# Patient Record
Sex: Male | Born: 1970 | Race: White | Hispanic: No | Marital: Married | Smoking: Former smoker
Health system: Southern US, Community
[De-identification: ages and names within clinical notes are randomized; demographics above are authoritative.]

## PROBLEM LIST (undated history)

## (undated) DIAGNOSIS — G4730 Sleep apnea, unspecified: Secondary | ICD-10-CM

## (undated) DIAGNOSIS — C801 Malignant (primary) neoplasm, unspecified: Secondary | ICD-10-CM

## (undated) DIAGNOSIS — K08409 Partial loss of teeth, unspecified cause, unspecified class: Secondary | ICD-10-CM

## (undated) DIAGNOSIS — Z9889 Other specified postprocedural states: Secondary | ICD-10-CM

## (undated) DIAGNOSIS — F329 Major depressive disorder, single episode, unspecified: Secondary | ICD-10-CM

## (undated) DIAGNOSIS — I10 Essential (primary) hypertension: Secondary | ICD-10-CM

## (undated) DIAGNOSIS — J449 Chronic obstructive pulmonary disease, unspecified: Secondary | ICD-10-CM

## (undated) DIAGNOSIS — F32A Depression, unspecified: Secondary | ICD-10-CM

## (undated) DIAGNOSIS — G629 Polyneuropathy, unspecified: Secondary | ICD-10-CM

## (undated) DIAGNOSIS — D4702 Systemic mastocytosis: Secondary | ICD-10-CM

## (undated) DIAGNOSIS — E119 Type 2 diabetes mellitus without complications: Secondary | ICD-10-CM

## (undated) DIAGNOSIS — Z9689 Presence of other specified functional implants: Secondary | ICD-10-CM

## (undated) DIAGNOSIS — K219 Gastro-esophageal reflux disease without esophagitis: Secondary | ICD-10-CM

## (undated) HISTORY — DX: Chronic obstructive pulmonary disease, unspecified: J44.9

## (undated) HISTORY — DX: Sleep apnea, unspecified: G47.30

## (undated) HISTORY — DX: Polyneuropathy, unspecified: G62.9

## (undated) HISTORY — DX: Depression, unspecified: F32.A

## (undated) HISTORY — DX: Major depressive disorder, single episode, unspecified: F32.9

---

## 2011-10-06 HISTORY — PX: OTHER SURGICAL HISTORY: SHX169

## 2011-11-03 HISTORY — PX: TYMPANOSTOMY TUBE PLACEMENT: SHX32

## 2011-12-15 HISTORY — PX: SEPTOPLASTY: SUR1290

## 2012-11-22 DIAGNOSIS — M8580 Other specified disorders of bone density and structure, unspecified site: Secondary | ICD-10-CM | POA: Insufficient documentation

## 2013-07-25 HISTORY — PX: X-STOP IMPLANTATION: SHX2677

## 2014-08-06 HISTORY — PX: OTHER SURGICAL HISTORY: SHX169

## 2014-11-05 HISTORY — PX: OTHER SURGICAL HISTORY: SHX169

## 2016-08-22 DIAGNOSIS — D4702 Systemic mastocytosis: Secondary | ICD-10-CM | POA: Insufficient documentation

## 2016-08-22 NOTE — Progress Notes (Signed)
[facility] day:  08/23/2016  Chief Complaint: Dwayne Richard is a 45 y.o. 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 Kary Kos for assessment and management.  HPI: The patient states that he was diagnosed with indolent systemic mastocytosis in approximately 2006. At that time he was living in [place]. He was seen by dermatology for urticaria pigmentosa.   Bone marrow biopsy on 07/09/2009 at [facility] in [place], [place] demonstrated normal cellularity. There were small poorly defined areas of apparent spindle cells which did not stained positive for mast cells. CK mutation was negative. There were rare atypical cells expressing CD-117 and CD-25. The cells represented approximately 0.1% of the leukocytes.   Bone marrow biopsy on 10/10/2013 performed at [facility] in [place] revealed evidence of persistent systemic mastocytosis (5-10% of cellularity).  There was no increase in blast or overt dyspoiesis.  There was moderate reticulin fibrosis. Storage iron was present. There were no ring sideroblasts.  Flow cytometry revealed no phenotypic evidence of lymphoma or increased blasts.  Cytogenetics were normal (45, XY). There was no evidence of KIT (D816V) point mutation by PCR.   He was started on several medications to control his chronic symptoms.  Tryptase was 38.3 (< 11.5) on 09/07/2013.   He moved to [place] 2014. He was seen in the [facility] by Dr. Dyann Ruddle on 09/11/2015.  At that time he noted episodes of pruritus with associated urticaria. Trigger for these episodes included sunlight or warm temperatures. He also noted shortness of breath with significant skin flares of pruritus and urticaria.  On those occasions, he used epinephrine autoinjector. On average, he uses epinephrine about 2-3 times a year. He actively avoid situations which provoke flares.  He has a chronic  gastrointestinal symptoms which include nausea and diarrhea. He has been using Gastrocrom good results.  He has osteopenia on bone density study in 2014. He is on calcium and vitamin D.  He has not had any fractures.  He is on a number antihistamines, proton pump inhibitors and Gastrocrom. He has been treated with Gleevec. He has been on [place] for about 3 years. He has not noted any significant improvement with Gleevec. He was on a trial of interferon in early 2016 for 3 months.  Interferon was discontinued in 07/2015.  He received as much as 7 million notes 3 times weekly.  Interferon did not improve his symptoms.  He experienced uncontrolled diabetes and elevated liver function tests with interferon.  The patient was seen by Dr. Melissa Montane at [facility] in 2016 for consideration of bone marrow transplant. Transplant was not recommended.  He was seen Yetta Flock on 09/11/2015 at the [facility]. At that time he did not definitely definitively meet the criteria for systemic mastocytosis. He met 2 minor criteria an equivocal finding the third minor criteria.  Symptoms were consistent with a mast cell disorder.  Additional testing was requested.  He moved to [facility].  CBC on 07/29/2016 revealed a hematocrit of 38.2, hemoglobin 11.8, MCV 97.7,  platelets 231,000, and WBC 8900 with a normal differential.  CMP revealed a creatinine of 0.8, AST 40, ALT 64, alkaline phosphatase 68, and bilirubin 0.4.  Tryptase on 07/29/2016 was 32.3 (2.2-13.2).  Symptomatically, he is at his baseline.  Energy level is fair.   Past Medical History:  Diagnosis Date  . COPD (chronic obstructive pulmonary disease) ([place])   . Depression   . Neuropathy (  [place])   . Sleep apnea     Past Surgical History:  Procedure Laterality Date  . cataract surgery  08/06/2014  . cataract surgery  11/05/2014  . SEPTOPLASTY  12/15/2011  . Spinal implants  10/06/2011  . TYMPANOSTOMY TUBE PLACEMENT Right 11/03/2011  . X-STOP  IMPLANTATION Right 07/25/2013    No family history on file.  Social History:  reports that he quit smoking about 23 months ago. He does not have any smokeless tobacco history on file. His alcohol and drug histories are not on file.  He and his wife moved from [place].  The patient is alone today.  Allergies: Not on File  Current Medications: Current Outpatient Prescriptions  Medication Sig Dispense Refill  . Lancets 28G MISC Use 1 each 3 (three) times daily. Use as instructed.    . traMADol (ULTRAM) 50 MG tablet Take by mouth.    Marland Kitchen albuterol (PROVENTIL HFA;VENTOLIN HFA) 108 (90 Base) MCG/ACT inhaler Inhale into the lungs.    Marland Kitchen aspirin EC 81 MG tablet Take by mouth.    Marland Kitchen atorvastatin (LIPITOR) 20 MG tablet Take by mouth.    . Calcium Citrate 200 MG TABS Take by mouth.    . Cetirizine HCl 10 MG CAPS Take by mouth.    . cimetidine (TAGAMET) 400 MG tablet Take by mouth.    . cromolyn (GASTROCROM) 100 MG/5ML solution Take by mouth.    . diphenhydrAMINE (BENADRYL) 50 MG capsule Take by mouth.    . EPINEPHrine 0.3 mg/0.3 mL IJ SOAJ injection Inject into the muscle.    . fluticasone (FLONASE) 50 MCG/ACT nasal spray Place into the nose.    . gabapentin (NEURONTIN) 600 MG tablet Take by mouth.    Marland Kitchen glimepiride (AMARYL) 4 MG tablet Take by mouth.    Marland Kitchen glucagon (GLUCAGON EMERGENCY) 1 MG injection Inject into the muscle.    . imatinib (GLEEVEC) 400 MG tablet Take by mouth.    . Insulin Glargine (LANTUS [place]) Inject 70 Units into the skin daily.    . insulin regular (NOVOLIN R,HUMULIN R) 100 units/mL injection Inject into the skin.    . Insulin Syringe-Needle U-100 (B-D INS SYR ULTRAFINE .3CC/30G) 30G X 1/2" 0.3 ML MISC Use 1 Syringe as directed.    Marland Kitchen losartan (COZAAR) 100 MG tablet Take by mouth.    . Melatonin 10 MG TABS Take by mouth.    . metFORMIN (GLUCOPHAGE) 1000 MG tablet Take by mouth.    Marland Kitchen omeprazole (PRILOSEC) 20 MG capsule Take by mouth.    . phenazopyridine (PYRIDIUM) 200 MG tablet  Take by mouth.    . tamsulosin (FLOMAX) 0.4 MG CAPS capsule Take by mouth.    Marland Kitchen tiZANidine (ZANAFLEX) 4 MG capsule Take by mouth.    . zolpidem (AMBIEN CR) 12.5 MG CR tablet Take by mouth.     No current facility-administered medications for this visit.     Review of Systems:  GENERAL:  Feels "ok".  No fevers, sweats or weight loss. PERFORMANCE STATUS (ECOG):  1 HEENT:  No visual changes, runny nose, sore throat, mouth sores or tenderness. Lungs: Shortness of breath on exertion.  No cough.  No hemoptysis.  Sleep apnea. Cardiac:  No chest pain, palpitations, orthopnea, or PND. GI:  Nausea and diarrhea secondary to mastocytosis.  No vomiting, constipation, melena or hematochezia.  Hemorrhoids. GU:  No urgency, frequency, dysuria, or hematuria. On Flomax. Musculoskeletal:  No back pain.  No joint pain.  No muscle tenderness. Extremities:  No pain  or swelling. Skin:  No rashes or skin changes. Neuro:  No headache, numbness or weakness, balance or coordination issues. Endocrine:  No diabetes, thyroid issues, hot flashes or night sweats. Psych:  No mood changes, depression or anxiety. Pain:  No focal pain. Review of systems:  All other systems reviewed and found to be negative.  Physical Exam: Blood pressure (!) 152/83, pulse 85, temperature 97.7 F (36.5 C), temperature source Tympanic, resp. rate 18, height 6' 3"  (1.905 m), weight (!) 374 lb 12.5 oz (170 kg). GENERAL:  Well developed, well nourished, overweight gentleman sitting comfortably in the exam room in no acute distress. MENTAL STATUS:  Alert and oriented to person, place and time. HEAD:  Short hair.  Normocephalic, atraumatic, face symmetric, no Cushingoid features. EYES:  Glasses.  Pupils equal round and reactive to light and accomodation.  No conjunctivitis or scleral icterus. ENT:  Oropharynx clear without lesion.  Tongue normal. Mucous membranes moist.  RESPIRATORY:  Clear to auscultation without rales, wheezes or  rhonchi. CARDIOVASCULAR:  Regular rate and rhythm without murmur, rub or gallop. ABDOMEN:  Soft, non-tender, with active bowel sounds, and no appreciable hepatosplenomegaly.  No masses. 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  No dermatographia.  No rashes, ulcers or lesions. EXTREMITIES: No edema, no skin discoloration or tenderness.  No palpable cords. LYMPH NODES: No palpable cervical, supraclavicular, axillary or inguinal adenopathy  NEUROLOGICAL: Unremarkable. PSYCH:  Appropriate.   No visits with results within 3 Day(s) from this visit.  Latest known visit with results is:  No results found for any previous visit.    Assessment:  Dwayne Richard is a 45 y.o. 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  He was diagnosed in 2006 after presenting with urticaria pigmentosa. Tryptase was 32.3 (2.2-13.2) on 07/29/2016.    He is on several medications: cetirizine (Zyrtec), cimetidine (Tagamet), cromolyn (Gastrocrom), and Gleevec (400 mg a day).  He uses an epinephrine pen 3-4 times a year.  He was on a trial of interferon in early 2016 x 3 months.  Interferon was discontinued in 07/2015.  Interferon did not improve his symptoms.  He experienced uncontrolled diabetes and elevated liver function tests with interferon.  Bone marrow biopsy on 07/09/2009 at [facility] in [place], [place] demonstrated normal cellularity. There were small poorly defined areas of apparent spindle cells which did not stained positive for mast cells. CK mutation was negative. There were rare atypical cells expressing CD-117 and CD-25. The cells represented approximately 0.1% of the leukocytes.   Bone marrow biopsy on 10/10/2013 performed at [facility] in [place] revealed evidence of persistent systemic mastocytosis (5-10% of cellularity).  There was no increase in blast or overt dyspoiesis.  There was moderate reticulin fibrosis. Storage iron was present. There were no ring sideroblasts.   Flow cytometry revealed no phenotypic evidence of lymphoma or increased blasts.  Cytogenetics were normal (45, XY). There was no evidence of KIT (D816V) point mutation by PCR.   He has a chronic gastrointestinal symptoms (nausea and diarrhea). He has osteopenia on bone density study in 2014.  He is on calcium and vitamin D.  He has not had any fractures.  Symptomatically, his symptoms are well controlled.  Exam reveals faint urticaria pigmentosa.  Plan: 1.  Review diagnosis and management of systemic mastocytosis.  No need for intervention as symptoms well controlled.  Discuss plan for follow-up to assess for any transformation of disease. 2.  Release of information from [facility] and [place]. 3.  RTC in 1 month for MD assessment, labs (CBC with diff, CMP, tryptase, LDH).   Lequita Asal, MD  08/23/2016

## 2016-08-23 ENCOUNTER — Encounter: Payer: Self-pay | Admitting: Hematology and Oncology

## 2016-08-23 ENCOUNTER — Encounter (INDEPENDENT_AMBULATORY_CARE_PROVIDER_SITE_OTHER): Payer: Self-pay

## 2016-08-23 ENCOUNTER — Inpatient Hospital Stay: Payer: BLUE CROSS/BLUE SHIELD | Attending: Hematology and Oncology | Admitting: Hematology and Oncology

## 2016-08-23 DIAGNOSIS — Z7982 Long term (current) use of aspirin: Secondary | ICD-10-CM | POA: Diagnosis not present

## 2016-08-23 DIAGNOSIS — D649 Anemia, unspecified: Secondary | ICD-10-CM | POA: Diagnosis not present

## 2016-08-23 DIAGNOSIS — R197 Diarrhea, unspecified: Secondary | ICD-10-CM | POA: Diagnosis not present

## 2016-08-23 DIAGNOSIS — Z794 Long term (current) use of insulin: Secondary | ICD-10-CM | POA: Insufficient documentation

## 2016-08-23 DIAGNOSIS — Z87891 Personal history of nicotine dependence: Secondary | ICD-10-CM | POA: Diagnosis not present

## 2016-08-23 DIAGNOSIS — G4730 Sleep apnea, unspecified: Secondary | ICD-10-CM | POA: Insufficient documentation

## 2016-08-23 DIAGNOSIS — G629 Polyneuropathy, unspecified: Secondary | ICD-10-CM | POA: Diagnosis not present

## 2016-08-23 DIAGNOSIS — J449 Chronic obstructive pulmonary disease, unspecified: Secondary | ICD-10-CM | POA: Insufficient documentation

## 2016-08-23 DIAGNOSIS — R110 Nausea: Secondary | ICD-10-CM | POA: Insufficient documentation

## 2016-08-23 DIAGNOSIS — Z79899 Other long term (current) drug therapy: Secondary | ICD-10-CM | POA: Diagnosis not present

## 2016-08-23 DIAGNOSIS — F329 Major depressive disorder, single episode, unspecified: Secondary | ICD-10-CM

## 2016-08-23 DIAGNOSIS — D4702 Systemic mastocytosis: Secondary | ICD-10-CM

## 2016-08-23 MED ORDER — CROMOLYN SODIUM 100 MG/5ML PO CONC: 200.0000 mg | Freq: Three times a day (TID) | ORAL | 12 refills | Status: AC

## 2016-08-23 NOTE — Progress Notes (Signed)
Patient here today as new evaluation regarding Systemic Mastocytosis.  Referred by Dr. Kary Kos.

## 2016-09-22 ENCOUNTER — Other Ambulatory Visit: Payer: Self-pay | Admitting: *Deleted

## 2016-09-22 DIAGNOSIS — D4709 Other mast cell neoplasms of uncertain behavior: Secondary | ICD-10-CM

## 2016-09-23 ENCOUNTER — Inpatient Hospital Stay: Payer: BLUE CROSS/BLUE SHIELD | Attending: Hematology and Oncology

## 2016-09-23 ENCOUNTER — Inpatient Hospital Stay (HOSPITAL_BASED_OUTPATIENT_CLINIC_OR_DEPARTMENT_OTHER): Payer: BLUE CROSS/BLUE SHIELD | Admitting: Hematology and Oncology

## 2016-09-23 VITALS — BP 131/75 | HR 94 | Temp 98.7°F | Resp 18 | Wt 373.5 lb

## 2016-09-23 DIAGNOSIS — G629 Polyneuropathy, unspecified: Secondary | ICD-10-CM | POA: Diagnosis not present

## 2016-09-23 DIAGNOSIS — R197 Diarrhea, unspecified: Secondary | ICD-10-CM | POA: Diagnosis not present

## 2016-09-23 DIAGNOSIS — Z794 Long term (current) use of insulin: Secondary | ICD-10-CM | POA: Diagnosis not present

## 2016-09-23 DIAGNOSIS — M8580 Other specified disorders of bone density and structure, unspecified site: Secondary | ICD-10-CM | POA: Diagnosis not present

## 2016-09-23 DIAGNOSIS — J449 Chronic obstructive pulmonary disease, unspecified: Secondary | ICD-10-CM | POA: Insufficient documentation

## 2016-09-23 DIAGNOSIS — Z7982 Long term (current) use of aspirin: Secondary | ICD-10-CM | POA: Diagnosis not present

## 2016-09-23 DIAGNOSIS — Z79899 Other long term (current) drug therapy: Secondary | ICD-10-CM | POA: Diagnosis not present

## 2016-09-23 DIAGNOSIS — D4702 Systemic mastocytosis: Secondary | ICD-10-CM

## 2016-09-23 DIAGNOSIS — F329 Major depressive disorder, single episode, unspecified: Secondary | ICD-10-CM | POA: Insufficient documentation

## 2016-09-23 DIAGNOSIS — G4730 Sleep apnea, unspecified: Secondary | ICD-10-CM

## 2016-09-23 DIAGNOSIS — Z87891 Personal history of nicotine dependence: Secondary | ICD-10-CM | POA: Insufficient documentation

## 2016-09-23 DIAGNOSIS — D649 Anemia, unspecified: Secondary | ICD-10-CM

## 2016-09-23 DIAGNOSIS — D4709 Other mast cell neoplasms of uncertain behavior: Secondary | ICD-10-CM

## 2016-09-23 LAB — COMPREHENSIVE METABOLIC PANEL
ALT: 54 U/L (ref 17–63)
AST: 50 U/L — ABNORMAL HIGH (ref 15–41)
Albumin: 4.1 g/dL (ref 3.5–5.0)
Alkaline Phosphatase: 71 U/L (ref 38–126)
Anion gap: 10 (ref 5–15)
BUN: 13 mg/dL (ref 6–20)
CO2: 25 mmol/L (ref 22–32)
Calcium: 8.8 mg/dL — ABNORMAL LOW (ref 8.9–10.3)
Chloride: 101 mmol/L (ref 101–111)
Creatinine, Ser: 0.89 mg/dL (ref 0.61–1.24)
GFR calc Af Amer: >60 mL/min (ref >60)
GFR calc non Af Amer: >60 mL/min (ref >60)
Glucose, Bld: 218 mg/dL — ABNORMAL HIGH (ref 65–99)
Potassium: 4 mmol/L (ref 3.5–5.1)
Sodium: 136 mmol/L (ref 135–145)
Total Bilirubin: 0.6 mg/dL (ref 0.3–1.2)
Total Protein: 7.7 g/dL (ref 6.5–8.1)

## 2016-09-23 LAB — CBC WITH DIFFERENTIAL/PLATELET
Basophils Absolute: 0 10*3/uL (ref 0–0.1)
Basophils Relative: 0 %
Eosinophils Absolute: 0.1 10*3/uL (ref 0–0.7)
Eosinophils Relative: 1 %
HCT: 35.3 % — ABNORMAL LOW (ref 40.0–52.0)
Hemoglobin: 11.4 g/dL — ABNORMAL LOW (ref 13.0–18.0)
Lymphocytes Relative: 28 %
Lymphs Abs: 2.7 10*3/uL (ref 1.0–3.6)
MCH: 30 pg (ref 26.0–34.0)
MCHC: 32.3 g/dL (ref 32.0–36.0)
MCV: 92.7 fL (ref 80.0–100.0)
Monocytes Absolute: 0.7 10*3/uL (ref 0.2–1.0)
Monocytes Relative: 8 %
Neutro Abs: 5.9 10*3/uL (ref 1.4–6.5)
Neutrophils Relative %: 63 %
Platelets: 259 10*3/uL (ref 150–440)
RBC: 3.81 MIL/uL — ABNORMAL LOW (ref 4.40–5.90)
RDW: 14.8 % — ABNORMAL HIGH (ref 11.5–14.5)
WBC: 9.4 10*3/uL (ref 3.8–10.6)

## 2016-09-23 LAB — LACTATE DEHYDROGENASE: LDH: 156 U/L (ref 98–192)

## 2016-09-23 NOTE — Progress Notes (Signed)
[facility] day:  09/23/2016  Chief Complaint: Dwayne Richard is a 45 y.o. 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  HPI:  The patient was last seen in the hematology clinic on 08/23/2016 for initial assessment.  At that time he was seen for initial assessment.  Symptoms were well controlled on Zyrtec, Tagamet, Gastrocrom, and Gleevec.  Outside records were requested and reviewed.  During the interim he notes "same old same old". He has had no flares of severe urticaria with associated shortness of breath requiring epinephrine.  He denies any B symptoms.  He denies any any infections.  He denies any bruising or bleeding.  He denies any adenopathy.   Past Medical History:  Diagnosis Date  . COPD (chronic obstructive pulmonary disease) ([place])   . Depression   . Neuropathy ([place])   . Sleep apnea     Past Surgical History:  Procedure Laterality Date  . cataract surgery  08/06/2014  . cataract surgery  11/05/2014  . SEPTOPLASTY  12/15/2011  . Spinal implants  10/06/2011  . TYMPANOSTOMY TUBE PLACEMENT Right 11/03/2011  . X-STOP IMPLANTATION Right 07/25/2013    No family history on file.  Social History:  reports that he quit smoking about 22 months ago. He does not have any smokeless tobacco history on file. His alcohol and drug histories are not on file.  He and his wife moved from [place].  The patient is alone today.  Allergies: Not on File  Current Medications: Current Outpatient Prescriptions  Medication Sig Dispense Refill  . albuterol (PROVENTIL HFA;VENTOLIN HFA) 108 (90 Base) MCG/ACT inhaler Inhale into the lungs.    Marland Kitchen aspirin EC 81 MG tablet Take by mouth.    Marland Kitchen atorvastatin (LIPITOR) 20 MG tablet Take by mouth.    . Calcium Citrate 200 MG TABS Take by mouth.    . Cetirizine HCl 10 MG CAPS Take by mouth.    . cimetidine (TAGAMET) 400 MG tablet Take by mouth.    . cromolyn (GASTROCROM) 100 MG/5ML  solution Take by mouth.    . diphenhydrAMINE (BENADRYL) 50 MG capsule Take by mouth.    . EPINEPHrine 0.3 mg/0.3 mL IJ SOAJ injection Inject into the muscle.    . fluticasone (FLONASE) 50 MCG/ACT nasal spray Place into the nose.    . gabapentin (NEURONTIN) 600 MG tablet Take by mouth.    Marland Kitchen glimepiride (AMARYL) 4 MG tablet Take by mouth.    Marland Kitchen glucagon (GLUCAGON EMERGENCY) 1 MG injection Inject into the muscle.    . imatinib (GLEEVEC) 400 MG tablet Take by mouth.    . Insulin Glargine (LANTUS [place]) Inject 70 Units into the skin daily.    . insulin regular (NOVOLIN R,HUMULIN R) 100 units/mL injection Inject into the skin.    . Insulin Syringe-Needle U-100 (B-D INS SYR ULTRAFINE .3CC/30G) 30G X 1/2" 0.3 ML MISC Use 1 Syringe as directed.    . Lancets 28G MISC Use 1 each 3 (three) times daily. Use as instructed.    Marland Kitchen losartan (COZAAR) 100 MG tablet Take by mouth.    . Melatonin 10 MG TABS Take by mouth.    . metFORMIN (GLUCOPHAGE) 1000 MG tablet Take by mouth.    Marland Kitchen omeprazole (PRILOSEC) 20 MG capsule Take by mouth.    . phenazopyridine (PYRIDIUM) 200 MG tablet Take by mouth.    . tamsulosin (FLOMAX) 0.4 MG CAPS capsule Take by mouth.    Marland Kitchen tiZANidine (  ZANAFLEX) 4 MG capsule Take by mouth.    . traMADol (ULTRAM) 50 MG tablet Take by mouth.    . zolpidem (AMBIEN CR) 12.5 MG CR tablet Take by mouth.     No current facility-administered medications for this visit.     Review of Systems:  GENERAL:  Feels "the same".  No fevers or sweats.  Weight down 1 pound. PERFORMANCE STATUS (ECOG):  1 HEENT:  No visual changes, runny nose, sore throat, mouth sores or tenderness. Lungs: Shortness of breath on exertion.  No cough.  No hemoptysis.  Sleep apnea. Cardiac:  No chest pain, palpitations, orthopnea, or PND. GI:  Nausea and diarrhea secondary to mastocytosis.  No vomiting, constipation, melena or hematochezia.  Hemorrhoids. GU:  No urgency, frequency, dysuria, or hematuria. On Flomax. Musculoskeletal:   No back pain.  No joint pain.  No muscle tenderness. Extremities:  No pain or swelling. Skin:  No severe flares or urticaria. Neuro:  No headache, numbness or weakness, balance or coordination issues. Endocrine:  Diabetes.  No tthyroid issues, hot flashes or night sweats. Psych:  No mood changes, depression or anxiety. Pain:  No focal pain. Review of systems:  All other systems reviewed and found   Physical Exam: Blood pressure 131/75, pulse 94, temperature 98.7 F (37.1 C), temperature source Tympanic, resp. rate 18, weight (!) 373 lb 7.4 oz (169.4 kg). GENERAL:  Well developed, well nourished, overweight gentleman sitting comfortably in the exam room in no acute distress. MENTAL STATUS:  Alert and oriented to person, place and time. HEAD:  Short crewcut.  Normocephalic, atraumatic, face symmetric, no Cushingoid features. EYES:  Glasses.  Pupils equal round and reactive to light and accomodation.  No conjunctivitis or scleral icterus. ENT:  Oropharynx clear without lesion.  Tongue normal. Mucous membranes moist.  RESPIRATORY:  Clear to auscultation without rales, wheezes or rhonchi. CARDIOVASCULAR:  Regular rate and rhythm without murmur, rub or gallop. ABDOMEN:  Soft, non-tender, with active bowel sounds, and no appreciable hepatosplenomegaly.  No masses. SKIN:  Cornified lesion left upper arm.  Faint urticaria pigmentosa on back.  No dermatographia.  EXTREMITIES: No edema, no skin discoloration or tenderness.  No palpable cords. LYMPH NODES: No palpable cervical, supraclavicular, axillary or inguinal adenopathy  NEUROLOGICAL: Unremarkable. PSYCH:  Appropriate.   Appointment on 09/23/2016  Component Date Value Ref Range Status  . WBC 09/23/2016 9.4  3.8 - 10.6 K/uL Final  . RBC 09/23/2016 3.81* 4.40 - 5.90 MIL/uL Final  . Hemoglobin 09/23/2016 11.4* 13.0 - 18.0 g/dL Final  . HCT 09/23/2016 35.3* 40.0 - 52.0 % Final  . MCV 09/23/2016 92.7  80.0 - 100.0 fL Final  . MCH 09/23/2016  30.0  26.0 - 34.0 pg Final  . MCHC 09/23/2016 32.3  32.0 - 36.0 g/dL Final  . RDW 09/23/2016 14.8* 11.5 - 14.5 % Final  . Platelets 09/23/2016 259  150 - 440 K/uL Final  . Neutrophils Relative % 09/23/2016 63  % Final  . Neutro Abs 09/23/2016 5.9  1.4 - 6.5 K/uL Final  . Lymphocytes Relative 09/23/2016 28  % Final  . Lymphs Abs 09/23/2016 2.7  1.0 - 3.6 K/uL Final  . Monocytes Relative 09/23/2016 8  % Final  . Monocytes Absolute 09/23/2016 0.7  0.2 - 1.0 K/uL Final  . Eosinophils Relative 09/23/2016 1  % Final  . Eosinophils Absolute 09/23/2016 0.1  0 - 0.7 K/uL Final  . Basophils Relative 09/23/2016 0  % Final  . Basophils Absolute 09/23/2016 0.0  0 - 0.1 K/uL Final  . Sodium 09/23/2016 136  135 - 145 mmol/L Final  . Potassium 09/23/2016 4.0  3.5 - 5.1 mmol/L Final  . Chloride 09/23/2016 101  101 - 111 mmol/L Final  . CO2 09/23/2016 25  22 - 32 mmol/L Final  . Glucose, Bld 09/23/2016 218* 65 - 99 mg/dL Final  . BUN 09/23/2016 13  6 - 20 mg/dL Final  . Creatinine, Ser 09/23/2016 0.89  0.61 - 1.24 mg/dL Final  . Calcium 09/23/2016 8.8* 8.9 - 10.3 mg/dL Final  . Total Protein 09/23/2016 7.7  6.5 - 8.1 g/dL Final  . Albumin 09/23/2016 4.1  3.5 - 5.0 g/dL Final  . AST 09/23/2016 50* 15 - 41 U/L Final  . ALT 09/23/2016 54  17 - 63 U/L Final  . Alkaline Phosphatase 09/23/2016 71  38 - 126 U/L Final  . Total Bilirubin 09/23/2016 0.6  0.3 - 1.2 mg/dL Final  . GFR calc non Af Amer 09/23/2016 >60  >60 mL/min Final  . GFR calc Af Amer 09/23/2016 >60  >60 mL/min Final   Comment: (NOTE) The eGFR has been calculated using the CKD EPI equation. This calculation has not been validated in all clinical situations. eGFR's persistently <60 mL/min signify possible Chronic Kidney Disease.   . Anion gap 09/23/2016 10  5 - 15 Final  . LDH 09/23/2016 156  98 - 192 U/L Final    Assessment:  Dwayne Richard is a 45 y.o. 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  He was diagnosed in 2006 after presenting with  urticaria pigmentosa. Tryptase was 32.3 (2.2-13.2) on 07/29/2016.    He is on several medications: cetirizine (Zyrtec), cimetidine (Tagamet), cromolyn (Gastrocrom), and Gleevec (400 mg a day).  He uses an epinephrine pen 3-4 times a year.  He was on a trial of interferon in early 2016 x 3 months.  Interferon was discontinued in 07/2015.  Interferon did not improve his symptoms.  He experienced uncontrolled diabetes and elevated liver function tests with interferon.  Bone marrow biopsy on 07/09/2009 at [facility] in [place], [place] demonstrated normal cellularity. There were small poorly defined areas of apparent spindle cells which did not stained positive for mast cells. CK mutation was negative. There were rare atypical cells expressing CD-117 and CD-25. The cells represented approximately 0.1% of the leukocytes.   Bone marrow biopsy on 10/10/2013 performed at [facility] in [place] revealed evidence of persistent systemic mastocytosis (5-10% of cellularity).  There was no increase in blast or overt dyspoiesis.  There was moderate reticulin fibrosis. Storage iron was present. There were no ring sideroblasts.  Flow cytometry revealed no phenotypic evidence of lymphoma or increased blasts.  Cytogenetics were normal (45, XY). There was no evidence of KIT (D816V) point mutation by PCR.   He has a chronic gastrointestinal symptoms (nausea and diarrhea). He has osteopenia on bone density study in 2014.  He is on calcium and vitamin D.  He has not had any fractures.  Symptomatically, his symptoms are well controlled.  Exam is stable.  CBC reveals a mild normocytic anemia.  Plan: 1.  Discuss review of outside records.  Discuss continuation of current management. 2.  Continue cetirizine, cimetidine, cromolyn, and Gleevec. 3.  Anticipate follow-up bone density study. 4.  RTC in 3 months for MD assessment and labs (CBC with diff, CMP, tryptase, ferritin, iron  studies, retic, B12, folate, TSH).   Lequita Asal, MD  09/23/2016, 11:45 AM

## 2016-09-24 LAB — TRYPTASE: Tryptase: 30.9 ug/L — ABNORMAL HIGH (ref 2.2–13.2)

## 2016-09-27 ENCOUNTER — Other Ambulatory Visit: Payer: Self-pay | Admitting: *Deleted

## 2016-09-27 DIAGNOSIS — D4702 Systemic mastocytosis: Secondary | ICD-10-CM

## 2016-10-31 ENCOUNTER — Encounter: Payer: Self-pay | Admitting: Hematology and Oncology

## 2016-10-31 DIAGNOSIS — D649 Anemia, unspecified: Secondary | ICD-10-CM | POA: Insufficient documentation

## 2016-11-06 ENCOUNTER — Encounter: Payer: Self-pay | Admitting: Hematology and Oncology

## 2016-11-09 ENCOUNTER — Other Ambulatory Visit: Payer: Self-pay | Admitting: *Deleted

## 2016-11-09 MED ORDER — IMATINIB MESYLATE 400 MG PO TABS: 400.0000 mg | ORAL_TABLET | Freq: Every day | ORAL | 0 refills | Status: DC

## 2016-12-23 ENCOUNTER — Inpatient Hospital Stay: Payer: BLUE CROSS/BLUE SHIELD | Admitting: Hematology and Oncology

## 2016-12-23 ENCOUNTER — Inpatient Hospital Stay: Payer: BLUE CROSS/BLUE SHIELD

## 2016-12-23 NOTE — Progress Notes (Unsigned)
[facility] day:  12/23/2016  Chief Complaint: Dwayne Richard is a 46 y.o. 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  HPI:  The patient was last seen in the hematology clinic on 09/23/2016 for initial assessment.  At that time, he denied any new complaints.  He denied any flares of severe urticaria.  Exam is stable.  CBC reveals a mild normocytic anemia.  Hematocrit was 35.3 with a hemoglobin of 11.4.  During the interim,   Past Medical History:  Diagnosis Date  . COPD (chronic obstructive pulmonary disease) ([place])   . Depression   . Neuropathy ([place])   . Sleep apnea     Past Surgical History:  Procedure Laterality Date  . cataract surgery  08/06/2014  . cataract surgery  11/05/2014  . SEPTOPLASTY  12/15/2011  . Spinal implants  10/06/2011  . TYMPANOSTOMY TUBE PLACEMENT Right 11/03/2011  . X-STOP IMPLANTATION Right 07/25/2013    No family history on file.  Social History:  reports that he quit smoking about 2 years ago. He does not have any smokeless tobacco history on file. His alcohol and drug histories are not on file.  He and his wife moved from [place].  The patient is alone today.  Allergies: Not on File  Current Medications: Current Outpatient Prescriptions  Medication Sig Dispense Refill  . albuterol (PROVENTIL HFA;VENTOLIN HFA) 108 (90 Base) MCG/ACT inhaler Inhale into the lungs.    Marland Kitchen aspirin EC 81 MG tablet Take by mouth.    Marland Kitchen atorvastatin (LIPITOR) 20 MG tablet Take by mouth.    . Calcium Citrate 200 MG TABS Take by mouth.    . Cetirizine HCl 10 MG CAPS Take by mouth.    . cimetidine (TAGAMET) 400 MG tablet Take by mouth.    . cromolyn (GASTROCROM) 100 MG/5ML solution Take by mouth.    . diphenhydrAMINE (BENADRYL) 50 MG capsule Take by mouth.    . EPINEPHrine 0.3 mg/0.3 mL IJ SOAJ injection Inject into the muscle.    . fluticasone (FLONASE) 50 MCG/ACT nasal spray Place into the nose.    .  gabapentin (NEURONTIN) 600 MG tablet Take by mouth.    Marland Kitchen glimepiride (AMARYL) 4 MG tablet Take by mouth.    Marland Kitchen glucagon (GLUCAGON EMERGENCY) 1 MG injection Inject into the muscle.    . imatinib (GLEEVEC) 400 MG tablet Take 1 tablet (400 mg total) by mouth daily. 90 tablet 0  . Insulin Glargine (LANTUS [place]) Inject 70 Units into the skin daily.    . insulin regular (NOVOLIN R,HUMULIN R) 100 units/mL injection Inject into the skin.    . Insulin Syringe-Needle U-100 (B-D INS SYR ULTRAFINE .3CC/30G) 30G X 1/2" 0.3 ML MISC Use 1 Syringe as directed.    . Lancets 28G MISC Use 1 each 3 (three) times daily. Use as instructed.    Marland Kitchen losartan (COZAAR) 100 MG tablet Take by mouth.    . Melatonin 10 MG TABS Take by mouth.    . metFORMIN (GLUCOPHAGE) 1000 MG tablet Take by mouth.    Marland Kitchen omeprazole (PRILOSEC) 20 MG capsule Take by mouth.    . phenazopyridine (PYRIDIUM) 200 MG tablet Take by mouth.    . tamsulosin (FLOMAX) 0.4 MG CAPS capsule Take by mouth.    Marland Kitchen tiZANidine (ZANAFLEX) 4 MG capsule Take by mouth.    . traMADol (ULTRAM) 50 MG tablet Take by mouth.    . zolpidem (AMBIEN CR) 12.5 MG CR  tablet Take by mouth.     No current facility-administered medications for this visit.     Review of Systems:  GENERAL:  Feels "the same".  No fevers or sweats.  Weight down 1 pound. PERFORMANCE STATUS (ECOG):  1 HEENT:  No visual changes, runny nose, sore throat, mouth sores or tenderness. Lungs: Shortness of breath on exertion.  No cough.  No hemoptysis.  Sleep apnea. Cardiac:  No chest pain, palpitations, orthopnea, or PND. GI:  Nausea and diarrhea secondary to mastocytosis.  No vomiting, constipation, melena or hematochezia.  Hemorrhoids. GU:  No urgency, frequency, dysuria, or hematuria. On Flomax. Musculoskeletal:  No back pain.  No joint pain.  No muscle tenderness. Extremities:  No pain or swelling. Skin:  No severe flares or urticaria. Neuro:  No headache, numbness or weakness, balance or coordination  issues. Endocrine:  Diabetes.  No tthyroid issues, hot flashes or night sweats. Psych:  No mood changes, depression or anxiety. Pain:  No focal pain. Review of systems:  All other systems reviewed and found   Physical Exam: There were no vitals taken for this visit. GENERAL:  Well developed, well nourished, overweight gentleman sitting comfortably in the exam room in no acute distress. MENTAL STATUS:  Alert and oriented to person, place and time. HEAD:  Short crewcut.  Normocephalic, atraumatic, face symmetric, no Cushingoid features. EYES:  Glasses.  Pupils equal round and reactive to light and accomodation.  No conjunctivitis or scleral icterus. ENT:  Oropharynx clear without lesion.  Tongue normal. Mucous membranes moist.  RESPIRATORY:  Clear to auscultation without rales, wheezes or rhonchi. CARDIOVASCULAR:  Regular rate and rhythm without murmur, rub or gallop. ABDOMEN:  Soft, non-tender, with active bowel sounds, and no appreciable hepatosplenomegaly.  No masses. SKIN:  Cornified lesion left upper arm.  Faint urticaria pigmentosa on back.  No dermatographia.  EXTREMITIES: No edema, no skin discoloration or tenderness.  No palpable cords. LYMPH NODES: No palpable cervical, supraclavicular, axillary or inguinal adenopathy  NEUROLOGICAL: Unremarkable. PSYCH:  Appropriate.   No visits with results within 3 Day(s) from this visit.  Latest known visit with results is:  Appointment on 09/23/2016  Component Date Value Ref Range Status  . WBC 09/23/2016 9.4  3.8 - 10.6 K/uL Final  . RBC 09/23/2016 3.81* 4.40 - 5.90 MIL/uL Final  . Hemoglobin 09/23/2016 11.4* 13.0 - 18.0 g/dL Final  . HCT 09/23/2016 35.3* 40.0 - 52.0 % Final  . MCV 09/23/2016 92.7  80.0 - 100.0 fL Final  . MCH 09/23/2016 30.0  26.0 - 34.0 pg Final  . MCHC 09/23/2016 32.3  32.0 - 36.0 g/dL Final  . RDW 09/23/2016 14.8* 11.5 - 14.5 % Final  . Platelets 09/23/2016 259  150 - 440 K/uL Final  . Neutrophils Relative %  09/23/2016 63  % Final  . Neutro Abs 09/23/2016 5.9  1.4 - 6.5 K/uL Final  . Lymphocytes Relative 09/23/2016 28  % Final  . Lymphs Abs 09/23/2016 2.7  1.0 - 3.6 K/uL Final  . Monocytes Relative 09/23/2016 8  % Final  . Monocytes Absolute 09/23/2016 0.7  0.2 - 1.0 K/uL Final  . Eosinophils Relative 09/23/2016 1  % Final  . Eosinophils Absolute 09/23/2016 0.1  0 - 0.7 K/uL Final  . Basophils Relative 09/23/2016 0  % Final  . Basophils Absolute 09/23/2016 0.0  0 - 0.1 K/uL Final  . Sodium 09/23/2016 136  135 - 145 mmol/L Final  . Potassium 09/23/2016 4.0  3.5 - 5.1 mmol/L Final  .  Chloride 09/23/2016 101  101 - 111 mmol/L Final  . CO2 09/23/2016 25  22 - 32 mmol/L Final  . Glucose, Bld 09/23/2016 218* 65 - 99 mg/dL Final  . BUN 09/23/2016 13  6 - 20 mg/dL Final  . Creatinine, Ser 09/23/2016 0.89  0.61 - 1.24 mg/dL Final  . Calcium 09/23/2016 8.8* 8.9 - 10.3 mg/dL Final  . Total Protein 09/23/2016 7.7  6.5 - 8.1 g/dL Final  . Albumin 09/23/2016 4.1  3.5 - 5.0 g/dL Final  . AST 09/23/2016 50* 15 - 41 U/L Final  . ALT 09/23/2016 54  17 - 63 U/L Final  . Alkaline Phosphatase 09/23/2016 71  38 - 126 U/L Final  . Total Bilirubin 09/23/2016 0.6  0.3 - 1.2 mg/dL Final  . GFR calc non Af Amer 09/23/2016 >60  >60 mL/min Final  . GFR calc Af Amer 09/23/2016 >60  >60 mL/min Final   Comment: (NOTE) The eGFR has been calculated using the CKD EPI equation. This calculation has not been validated in all clinical situations. eGFR's persistently <60 mL/min signify possible Chronic Kidney Disease.   . Anion gap 09/23/2016 10  5 - 15 Final  . LDH 09/23/2016 156  98 - 192 U/L Final  . Tryptase 09/23/2016 30.9* 2.2 - 13.2 ug/L Final   Comment: (NOTE) Performed At: [facility] [address] [place], [place] [id number] Lindon Romp MD ON:[id number]     Assessment:  Roderick Calo is a 46 y.o. 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  He was diagnosed in 2006 after presenting with urticaria  pigmentosa. Tryptase was 32.3 (2.2-13.2) on 07/29/2016.    He is on several medications: cetirizine (Zyrtec), cimetidine (Tagamet), cromolyn (Gastrocrom), and Gleevec (400 mg a day).  He uses an epinephrine pen 3-4 times a year.  He was on a trial of interferon in early 2016 x 3 months.  Interferon was discontinued in 07/2015.  Interferon did not improve his symptoms.  He experienced uncontrolled diabetes and elevated liver function tests with interferon.  Bone marrow biopsy on 07/09/2009 at [facility] in [place], [place] demonstrated normal cellularity. There were small poorly defined areas of apparent spindle cells which did not stained positive for mast cells. CK mutation was negative. There were rare atypical cells expressing CD-117 and CD-25. The cells represented approximately 0.1% of the leukocytes.   Bone marrow biopsy on 10/10/2013 performed at [facility] in [place] revealed evidence of persistent systemic mastocytosis (5-10% of cellularity).  There was no increase in blast or overt dyspoiesis.  There was moderate reticulin fibrosis. Storage iron was present. There were no ring sideroblasts.  Flow cytometry revealed no phenotypic evidence of lymphoma or increased blasts.  Cytogenetics were normal (46, XY). There was no evidence of KIT (D816V) point mutation by PCR.   He has a chronic gastrointestinal symptoms (nausea and diarrhea). He has osteopenia on bone density study in 2014.  He is on calcium and vitamin D.  He has not had any fractures.  Symptomatically, his symptoms are well controlled.  Exam is stable.  CBC reveals a mild normocytic anemia.  Plan: 1.  Labs today: CBC with diff, CMP, tryptase, ferritin, iron studies, retic, B12, folate, TSH. 2.  Schedule bone density. 3.  Continue cetirizine, cimetidine, cromolyn, and Gleevec. 4.  RTC in 3 months for MD assessment and labs (CBC with diff, CMP, tryptase).   Lequita Asal, MD   12/23/2016, 3:43 AM

## 2017-02-09 ENCOUNTER — Other Ambulatory Visit: Payer: Self-pay | Admitting: Hematology and Oncology

## 2017-02-09 ENCOUNTER — Other Ambulatory Visit: Payer: Self-pay | Admitting: *Deleted

## 2017-02-09 MED ORDER — IMATINIB MESYLATE 400 MG PO TABS: 400.0000 mg | ORAL_TABLET | Freq: Every day | ORAL | 0 refills | Status: DC

## 2017-02-09 NOTE — Telephone Encounter (Signed)
Patient cancelled his 12/23/16 appt and stated he would call back to reschedule it, but has not as of yet. Received refill request from pharmacy for his [place]. He has no future appts. Please advise

## 2017-02-09 NOTE — Telephone Encounter (Signed)
Please make patient a follow up appt for Dr. Mike Gip in April when she is back.   We can fill his prescription enough to get him to the next appt only  Thanks,  Haywood Meinders

## 2017-02-13 ENCOUNTER — Encounter: Payer: Self-pay | Admitting: Emergency Medicine

## 2017-02-13 ENCOUNTER — Inpatient Hospital Stay
Admission: EM | Admit: 2017-02-13 | Discharge: 2017-02-15 | DRG: 815 | Disposition: A | Discharge status: Home / Self Care | Payer: BLUE CROSS/BLUE SHIELD | Attending: Internal Medicine | Admitting: Internal Medicine

## 2017-02-13 ENCOUNTER — Emergency Department: Payer: BLUE CROSS/BLUE SHIELD

## 2017-02-13 DIAGNOSIS — D4702 Systemic mastocytosis: Secondary | ICD-10-CM | POA: Diagnosis present

## 2017-02-13 DIAGNOSIS — Z7982 Long term (current) use of aspirin: Secondary | ICD-10-CM

## 2017-02-13 DIAGNOSIS — R591 Generalized enlarged lymph nodes: Secondary | ICD-10-CM

## 2017-02-13 DIAGNOSIS — E1140 Type 2 diabetes mellitus with diabetic neuropathy, unspecified: Secondary | ICD-10-CM | POA: Diagnosis present

## 2017-02-13 DIAGNOSIS — J449 Chronic obstructive pulmonary disease, unspecified: Secondary | ICD-10-CM | POA: Diagnosis present

## 2017-02-13 DIAGNOSIS — I10 Essential (primary) hypertension: Secondary | ICD-10-CM | POA: Diagnosis present

## 2017-02-13 DIAGNOSIS — Z79899 Other long term (current) drug therapy: Secondary | ICD-10-CM

## 2017-02-13 DIAGNOSIS — E785 Hyperlipidemia, unspecified: Secondary | ICD-10-CM | POA: Diagnosis present

## 2017-02-13 DIAGNOSIS — R1310 Dysphagia, unspecified: Secondary | ICD-10-CM | POA: Diagnosis present

## 2017-02-13 DIAGNOSIS — R221 Localized swelling, mass and lump, neck: Secondary | ICD-10-CM

## 2017-02-13 DIAGNOSIS — Z833 Family history of diabetes mellitus: Secondary | ICD-10-CM | POA: Diagnosis not present

## 2017-02-13 DIAGNOSIS — R590 Localized enlarged lymph nodes: Secondary | ICD-10-CM | POA: Diagnosis not present

## 2017-02-13 DIAGNOSIS — Z8249 Family history of ischemic heart disease and other diseases of the circulatory system: Secondary | ICD-10-CM

## 2017-02-13 DIAGNOSIS — Z79891 Long term (current) use of opiate analgesic: Secondary | ICD-10-CM

## 2017-02-13 DIAGNOSIS — F329 Major depressive disorder, single episode, unspecified: Secondary | ICD-10-CM | POA: Diagnosis present

## 2017-02-13 DIAGNOSIS — G4733 Obstructive sleep apnea (adult) (pediatric): Secondary | ICD-10-CM | POA: Diagnosis present

## 2017-02-13 DIAGNOSIS — Z794 Long term (current) use of insulin: Secondary | ICD-10-CM

## 2017-02-13 DIAGNOSIS — Z87891 Personal history of nicotine dependence: Secondary | ICD-10-CM | POA: Diagnosis not present

## 2017-02-13 DIAGNOSIS — Z7951 Long term (current) use of inhaled steroids: Secondary | ICD-10-CM | POA: Diagnosis not present

## 2017-02-13 DIAGNOSIS — Z6841 Body Mass Index (BMI) 40.0 and over, adult: Secondary | ICD-10-CM | POA: Diagnosis not present

## 2017-02-13 DIAGNOSIS — C8590 Non-Hodgkin lymphoma, unspecified, unspecified site: Secondary | ICD-10-CM | POA: Diagnosis present

## 2017-02-13 HISTORY — DX: Type 2 diabetes mellitus without complications: E11.9

## 2017-02-13 HISTORY — DX: Systemic mastocytosis: D47.02

## 2017-02-13 HISTORY — DX: Essential (primary) hypertension: I10

## 2017-02-13 LAB — BASIC METABOLIC PANEL
ANION GAP: 7 (ref 5–15)
BUN: 11 mg/dL (ref 6–20)
CALCIUM: 8.8 mg/dL — AB (ref 8.9–10.3)
CO2: 27 mmol/L (ref 22–32)
Chloride: 103 mmol/L (ref 101–111)
Creatinine, Ser: 0.81 mg/dL (ref 0.61–1.24)
GFR calc Af Amer: >60 mL/min (ref >60)
GFR calc non Af Amer: >60 mL/min (ref >60)
GLUCOSE: 222 mg/dL — AB (ref 65–99)
Potassium: 4 mmol/L (ref 3.5–5.1)
SODIUM: 137 mmol/L (ref 135–145)

## 2017-02-13 LAB — CBC WITH DIFFERENTIAL/PLATELET
BASOS ABS: 0 10*3/uL (ref 0–0.1)
BASOS PCT: 0 %
EOS ABS: 0.2 10*3/uL (ref 0–0.7)
Eosinophils Relative: 2 %
HCT: 32.2 % — ABNORMAL LOW (ref 40.0–52.0)
Hemoglobin: 10.5 g/dL — ABNORMAL LOW (ref 13.0–18.0)
Lymphocytes Relative: 30 %
Lymphs Abs: 2.7 10*3/uL (ref 1.0–3.6)
MCH: 30.1 pg (ref 26.0–34.0)
MCHC: 32.5 g/dL (ref 32.0–36.0)
MCV: 92.6 fL (ref 80.0–100.0)
MONO ABS: 0.9 10*3/uL (ref 0.2–1.0)
Monocytes Relative: 10 %
Neutro Abs: 5 10*3/uL (ref 1.4–6.5)
Neutrophils Relative %: 58 %
PLATELETS: 281 10*3/uL (ref 150–440)
RBC: 3.48 MIL/uL — ABNORMAL LOW (ref 4.40–5.90)
RDW: 15.6 % — AB (ref 11.5–14.5)
WBC: 8.7 10*3/uL (ref 3.8–10.6)

## 2017-02-13 LAB — PROTIME-INR
INR: 1.07
Prothrombin Time: 13.9 seconds (ref 11.4–15.2)

## 2017-02-13 LAB — MONONUCLEOSIS SCREEN: MONO SCREEN: POSITIVE — AB

## 2017-02-13 LAB — POCT RAPID STREP A: Streptococcus, Group A Screen (Direct): NEGATIVE

## 2017-02-13 LAB — APTT: aPTT: 30 seconds (ref 24–36)

## 2017-02-13 LAB — URIC ACID: Uric Acid, Serum: 5.2 mg/dL (ref 4.4–7.6)

## 2017-02-13 LAB — LACTATE DEHYDROGENASE: LDH: 232 U/L — AB (ref 98–192)

## 2017-02-13 LAB — GLUCOSE, CAPILLARY: GLUCOSE-CAPILLARY: 319 mg/dL — AB (ref 65–99)

## 2017-02-13 MED ORDER — INSULIN GLARGINE 100 UNIT/ML ~~LOC~~ SOLN
45.0000 [IU] | Freq: Once | SUBCUTANEOUS | Status: AC
  Administered 2017-02-13: 45 [IU] via SUBCUTANEOUS
  Filled 2017-02-13 (×3): qty 0.45

## 2017-02-13 MED ORDER — PHENAZOPYRIDINE HCL 100 MG PO TABS
200.0000 mg | ORAL_TABLET | Freq: Two times a day (BID) | ORAL | Status: DC
  Administered 2017-02-13 – 2017-02-15 (×4): 200 mg via ORAL
  Filled 2017-02-13 (×4): qty 2

## 2017-02-13 MED ORDER — INSULIN ASPART 100 UNIT/ML ~~LOC~~ SOLN
0.0000 [IU] | Freq: Every day | SUBCUTANEOUS | Status: DC
  Administered 2017-02-13 – 2017-02-14 (×2): 4 [IU] via SUBCUTANEOUS
  Filled 2017-02-13 (×2): qty 4

## 2017-02-13 MED ORDER — ENOXAPARIN SODIUM 40 MG/0.4ML ~~LOC~~ SOLN
40.0000 mg | Freq: Two times a day (BID) | SUBCUTANEOUS | Status: DC
  Administered 2017-02-14 – 2017-02-15 (×3): 40 mg via SUBCUTANEOUS
  Filled 2017-02-13 (×3): qty 0.4

## 2017-02-13 MED ORDER — LORATADINE 10 MG PO TABS
10.0000 mg | ORAL_TABLET | Freq: Every day | ORAL | Status: DC
  Administered 2017-02-14 – 2017-02-15 (×2): 10 mg via ORAL
  Filled 2017-02-13 (×2): qty 1

## 2017-02-13 MED ORDER — ACETAMINOPHEN 650 MG RE SUPP: 650.0000 mg | Freq: Four times a day (QID) | RECTAL | Status: DC | PRN

## 2017-02-13 MED ORDER — INFLUENZA VAC SPLIT QUAD 0.5 ML IM SUSY
0.5000 mL | PREFILLED_SYRINGE | INTRAMUSCULAR | Status: AC
  Administered 2017-02-14: 11:00:00 0.5 mL via INTRAMUSCULAR
  Filled 2017-02-13: qty 0.5

## 2017-02-13 MED ORDER — SODIUM CHLORIDE 0.9 % IV SOLN
3.0000 g | Freq: Once | INTRAVENOUS | Status: AC
  Administered 2017-02-13: 3 g via INTRAVENOUS
  Filled 2017-02-13: qty 3

## 2017-02-13 MED ORDER — INSULIN GLARGINE 100 UNIT/ML ~~LOC~~ SOLN
90.0000 [IU] | Freq: Every day | SUBCUTANEOUS | Status: DC
  Administered 2017-02-14: 21:00:00 90 [IU] via SUBCUTANEOUS
  Filled 2017-02-13 (×3): qty 0.9

## 2017-02-13 MED ORDER — HYDRALAZINE HCL 20 MG/ML IJ SOLN: 10.0000 mg | Freq: Four times a day (QID) | INTRAMUSCULAR | Status: DC | PRN

## 2017-02-13 MED ORDER — CHLORHEXIDINE GLUCONATE 0.12 % MT SOLN
15.0000 mL | Freq: Two times a day (BID) | OROMUCOSAL | Status: DC
  Administered 2017-02-13 – 2017-02-15 (×3): 15 mL via OROMUCOSAL
  Filled 2017-02-13 (×5): qty 15

## 2017-02-13 MED ORDER — TRAMADOL HCL 50 MG PO TABS
50.0000 mg | ORAL_TABLET | Freq: Three times a day (TID) | ORAL | Status: DC
  Administered 2017-02-13 – 2017-02-15 (×5): 50 mg via ORAL
  Filled 2017-02-13 (×5): qty 1

## 2017-02-13 MED ORDER — DEXAMETHASONE SODIUM PHOSPHATE 10 MG/ML IJ SOLN
10.0000 mg | Freq: Once | INTRAMUSCULAR | Status: AC
  Administered 2017-02-13: 10 mg via INTRAVENOUS
  Filled 2017-02-13: qty 1

## 2017-02-13 MED ORDER — INSULIN ASPART 100 UNIT/ML ~~LOC~~ SOLN
0.0000 [IU] | Freq: Three times a day (TID) | SUBCUTANEOUS | Status: DC
  Administered 2017-02-14: 12:00:00 7 [IU] via SUBCUTANEOUS
  Administered 2017-02-14 (×2): 5 [IU] via SUBCUTANEOUS
  Administered 2017-02-15 (×2): 7 [IU] via SUBCUTANEOUS
  Filled 2017-02-13: qty 5
  Filled 2017-02-13: qty 7
  Filled 2017-02-13: qty 5
  Filled 2017-02-13 (×2): qty 7

## 2017-02-13 MED ORDER — TAMSULOSIN HCL 0.4 MG PO CAPS
0.4000 mg | ORAL_CAPSULE | Freq: Every day | ORAL | Status: DC
  Administered 2017-02-14 – 2017-02-15 (×2): 0.4 mg via ORAL
  Filled 2017-02-13 (×2): qty 1

## 2017-02-13 MED ORDER — DEXAMETHASONE SODIUM PHOSPHATE 4 MG/ML IJ SOLN
4.0000 mg | Freq: Four times a day (QID) | INTRAMUSCULAR | Status: DC
  Administered 2017-02-14 (×5): 4 mg via INTRAVENOUS
  Filled 2017-02-13 (×5): qty 1

## 2017-02-13 MED ORDER — BISACODYL 5 MG PO TBEC: 5.0000 mg | DELAYED_RELEASE_TABLET | Freq: Every day | ORAL | Status: DC | PRN

## 2017-02-13 MED ORDER — ALBUTEROL SULFATE HFA 108 (90 BASE) MCG/ACT IN AERS: 2.0000 | INHALATION_SPRAY | Freq: Four times a day (QID) | RESPIRATORY_TRACT | Status: DC | PRN

## 2017-02-13 MED ORDER — ONDANSETRON HCL 4 MG PO TABS: 4.0000 mg | ORAL_TABLET | Freq: Four times a day (QID) | ORAL | Status: DC | PRN

## 2017-02-13 MED ORDER — ACETAMINOPHEN 325 MG PO TABS: 650.0000 mg | ORAL_TABLET | Freq: Four times a day (QID) | ORAL | Status: DC | PRN

## 2017-02-13 MED ORDER — DIPHENHYDRAMINE HCL 25 MG PO CAPS
50.0000 mg | ORAL_CAPSULE | Freq: Two times a day (BID) | ORAL | Status: DC
  Administered 2017-02-13 – 2017-02-15 (×4): 50 mg via ORAL
  Filled 2017-02-13 (×4): qty 2

## 2017-02-13 MED ORDER — ORAL CARE MOUTH RINSE: 15.0000 mL | Freq: Two times a day (BID) | OROMUCOSAL | Status: DC

## 2017-02-13 MED ORDER — ZOLPIDEM TARTRATE 5 MG PO TABS: 10.0000 mg | ORAL_TABLET | Freq: Every day | ORAL | Status: DC

## 2017-02-13 MED ORDER — FAMOTIDINE 20 MG PO TABS
20.0000 mg | ORAL_TABLET | Freq: Two times a day (BID) | ORAL | Status: DC
  Administered 2017-02-13 – 2017-02-15 (×4): 20 mg via ORAL
  Filled 2017-02-13 (×4): qty 1

## 2017-02-13 MED ORDER — MELATONIN 5 MG PO TABS
10.0000 mg | ORAL_TABLET | Freq: Every day | ORAL | Status: DC
  Administered 2017-02-13 – 2017-02-14 (×2): 10 mg via ORAL
  Filled 2017-02-13 (×3): qty 2

## 2017-02-13 MED ORDER — ONDANSETRON HCL 4 MG/2ML IJ SOLN: 4.0000 mg | Freq: Four times a day (QID) | INTRAMUSCULAR | Status: DC | PRN

## 2017-02-13 MED ORDER — SODIUM CHLORIDE 0.9% FLUSH
3.0000 mL | Freq: Two times a day (BID) | INTRAVENOUS | Status: DC
  Administered 2017-02-13 – 2017-02-15 (×4): 3 mL via INTRAVENOUS

## 2017-02-13 MED ORDER — SODIUM CHLORIDE 0.9% FLUSH
3.0000 mL | INTRAVENOUS | Status: DC | PRN
  Administered 2017-02-14: 3 mL via INTRAVENOUS
  Filled 2017-02-13: qty 3

## 2017-02-13 MED ORDER — TIZANIDINE HCL 4 MG PO TABS
4.0000 mg | ORAL_TABLET | Freq: Two times a day (BID) | ORAL | Status: DC
  Administered 2017-02-13 – 2017-02-15 (×4): 4 mg via ORAL
  Filled 2017-02-13 (×4): qty 1

## 2017-02-13 MED ORDER — CROMOLYN SODIUM 100 MG/5ML PO CONC: 200.0000 mg | Freq: Three times a day (TID) | ORAL | Status: DC

## 2017-02-13 MED ORDER — IMATINIB MESYLATE 400 MG PO TABS
400.0000 mg | ORAL_TABLET | Freq: Every day | ORAL | Status: DC
  Administered 2017-02-14 – 2017-02-15 (×2): 400 mg via ORAL
  Filled 2017-02-13 (×3): qty 1

## 2017-02-13 MED ORDER — PANTOPRAZOLE SODIUM 40 MG PO TBEC
40.0000 mg | DELAYED_RELEASE_TABLET | Freq: Every day | ORAL | Status: DC
  Administered 2017-02-14 – 2017-02-15 (×2): 40 mg via ORAL
  Filled 2017-02-13 (×2): qty 1

## 2017-02-13 MED ORDER — FLUTICASONE PROPIONATE 50 MCG/ACT NA SUSP
2.0000 | Freq: Every day | NASAL | Status: DC
  Administered 2017-02-14 – 2017-02-15 (×2): 2 via NASAL
  Filled 2017-02-13: qty 16

## 2017-02-13 MED ORDER — ATORVASTATIN CALCIUM 20 MG PO TABS
20.0000 mg | ORAL_TABLET | Freq: Every evening | ORAL | Status: DC
  Administered 2017-02-13 – 2017-02-14 (×2): 20 mg via ORAL
  Filled 2017-02-13 (×2): qty 1

## 2017-02-13 MED ORDER — GABAPENTIN 600 MG PO TABS
1200.0000 mg | ORAL_TABLET | Freq: Three times a day (TID) | ORAL | Status: DC
  Administered 2017-02-13 – 2017-02-15 (×5): 1200 mg via ORAL
  Filled 2017-02-13 (×5): qty 2

## 2017-02-13 MED ORDER — IOPAMIDOL (ISOVUE-300) INJECTION 61%
75.0000 mL | Freq: Once | INTRAVENOUS | Status: AC | PRN
  Administered 2017-02-13: 75 mL via INTRAVENOUS

## 2017-02-13 MED ORDER — EPINEPHRINE 0.3 MG/0.3ML IJ SOAJ
0.3000 mg | Freq: Once | INTRAMUSCULAR | Status: DC
Start: 2017-02-13 — End: 2017-02-13

## 2017-02-13 MED ORDER — ZOLPIDEM TARTRATE 5 MG PO TABS
10.0000 mg | ORAL_TABLET | Freq: Every day | ORAL | Status: DC
  Administered 2017-02-13 – 2017-02-14 (×2): 10 mg via ORAL
  Filled 2017-02-13 (×2): qty 2

## 2017-02-13 MED ORDER — ALBUTEROL SULFATE (2.5 MG/3ML) 0.083% IN NEBU: 2.5000 mg | INHALATION_SOLUTION | RESPIRATORY_TRACT | Status: DC | PRN

## 2017-02-13 MED ORDER — SODIUM CHLORIDE 0.9 % IV SOLN: 250.0000 mL | INTRAVENOUS | Status: DC | PRN

## 2017-02-13 MED ORDER — LOSARTAN POTASSIUM 50 MG PO TABS
100.0000 mg | ORAL_TABLET | Freq: Every day | ORAL | Status: DC
  Administered 2017-02-14 – 2017-02-15 (×2): 100 mg via ORAL
  Filled 2017-02-13 (×2): qty 2

## 2017-02-13 MED ORDER — HYDROCODONE-ACETAMINOPHEN 5-325 MG PO TABS
1.0000 | ORAL_TABLET | ORAL | Status: DC | PRN
  Administered 2017-02-13: 18:00:00 1 via ORAL
  Administered 2017-02-14: 2 via ORAL
  Filled 2017-02-13: qty 1
  Filled 2017-02-13: qty 2

## 2017-02-13 MED ORDER — SENNOSIDES-DOCUSATE SODIUM 8.6-50 MG PO TABS: 1.0000 | ORAL_TABLET | Freq: Every evening | ORAL | Status: DC | PRN

## 2017-02-13 NOTE — ED Triage Notes (Signed)
Pt to ED via POV with facial swelling on the right side if his face and neck. Pt tongue dry and ulcerations on the inside of mouth. Pt is no currently in respiratory distress, pt denies trouble breathing at this time

## 2017-02-13 NOTE — Progress Notes (Signed)
PHARMACIST - PHYSICIAN COMMUNICATION  CONCERNING:  Enoxaparin (Lovenox) for DVT Prophylaxis    RECOMMENDATION: Patient was prescribed enoxaprin 40mg  q24 hours for VTE prophylaxis.   Filed Weights   02/13/17 1404  Weight: (!) 373 lb (169.2 kg)    Body mass index is 46.62 kg/m.  Estimated Creatinine Clearance: 190.8 mL/min (by C-G formula based on SCr of 0.81 mg/dL).  Based on [place] patient is candidate for enoxaparin 40mg  every 12 hour dosing due to BMI being >40.  DESCRIPTION: Pharmacy has adjusted enoxaparin dose per [facility] policy.  Patient is now receiving enoxaparin 40mg  every 12 hours.    Nancy Fetter, PharmD Clinical Pharmacist  02/13/2017 5:15 PM

## 2017-02-13 NOTE — H&P (Addendum)
[facility] NAME: Dwayne Richard    MR#:  [id number]  DATE OF BIRTH:  Aug 09, 1971  DATE OF ADMISSION:  02/13/2017  PRIMARY CARE PHYSICIAN: Maryland Pink, MD   REQUESTING/REFERRING PHYSICIAN: Rudene Re, MD  CHIEF COMPLAINT:   Chief Complaint  Patient presents with  . Facial Swelling   Neck swelling on right side 3 day HISTORY OF PRESENT ILLNESS:  Dwayne Richard  is a 46 y.o. male with a known history of COPD, OSA, HTN, DM and neuropathy. The patient to present to the ED for 8. The patient recently had large left lymph nodes which was biopsied but inconclusive. He noticed swelling on the right side 3 days ago, which has been worsening. He complains of headache, low-grade fever and sweating. In addition, he complains of dysphagia but no dyspnea or stridor. The CAT scan of the neck showed possible Leukemia/lymphoma. Oncologist suggested admission and biopsy.  PAST MEDICAL HISTORY:   Past Medical History:  Diagnosis Date  . COPD (chronic obstructive pulmonary disease) ([place])   . Depression   . Diabetes mellitus without complication ([place])   . Hypertension   . Neuropathy ([place])   . Sleep apnea     PAST SURGICAL HISTORY:   Past Surgical History:  Procedure Laterality Date  . cataract surgery  08/06/2014  . cataract surgery  11/05/2014  . SEPTOPLASTY  12/15/2011  . Spinal implants  10/06/2011  . TYMPANOSTOMY TUBE PLACEMENT Right 11/03/2011  . X-STOP IMPLANTATION Right 07/25/2013    SOCIAL HISTORY:   Social History  Substance Use Topics  . Smoking status: Former Smoker    Quit date: 11/22/2014  . Smokeless tobacco: Never Used     Comment: Patient smoked 24 years - 2 pks a day  . Alcohol use No    FAMILY HISTORY:   Family History  Problem Relation Age of Onset  . Diabetes Father   . Hypertension Father   . Diabetes Paternal Grandmother     DRUG ALLERGIES:  No Known Allergies  REVIEW OF SYSTEMS:    Review of Systems  Constitutional: Positive for fever. Negative for chills, malaise/fatigue and weight loss.       Low grade fever, Sweating.  HENT: Negative for ear discharge, ear pain, nosebleeds, sinus pain and sore throat.        Dysphagia  Eyes: Negative for blurred vision and double vision.  Respiratory: Negative for cough, shortness of breath and stridor.   Cardiovascular: Negative for chest pain and leg swelling.  Gastrointestinal: Negative for abdominal pain, blood in stool, diarrhea, melena, nausea and vomiting.  Genitourinary: Negative for dysuria and hematuria.  Musculoskeletal: Negative for back pain.  Skin: Negative for itching and rash.  Neurological: Positive for sensory change and headaches. Negative for dizziness, focal weakness, loss of consciousness and weakness.       No sensation below knees due to neuropathy.  Psychiatric/Behavioral: Negative for depression. The patient is not nervous/anxious.     MEDICATIONS AT HOME:   Prior to Admission medications   Medication Sig Start Date End Date Taking? Authorizing Provider  albuterol (PROVENTIL HFA;VENTOLIN HFA) 108 (90 Base) MCG/ACT inhaler Inhale 2 puffs into the lungs every 6 (six) hours as needed for wheezing.     Historical Provider, MD  aspirin EC 81 MG tablet Take 81 mg by mouth 2 (two) times daily.     Historical Provider, MD  atorvastatin (LIPITOR) 20 MG tablet Take 20 mg by mouth daily.  Historical Provider, MD  Calcium Citrate 200 MG TABS Take 1,000 mg by mouth 2 (two) times daily.     Historical Provider, MD  Cetirizine HCl 10 MG CAPS Take 10 mg by mouth 2 (two) times daily.     Historical Provider, MD  cimetidine (TAGAMET) 400 MG tablet Take 400 mg by mouth 2 (two) times daily.     Historical Provider, MD  cromolyn (GASTROCROM) 100 MG/5ML solution Take 200 mg by mouth 4 (four) times daily -  before meals and at bedtime.     Historical Provider, MD  diphenhydrAMINE (BENADRYL) 50 MG capsule Take 50 mg by  mouth 2 (two) times daily.     Historical Provider, MD  EPINEPHrine 0.3 mg/0.3 mL IJ SOAJ injection Inject 0.3 mg into the muscle once.     Historical Provider, MD  fluticasone (FLONASE) 50 MCG/ACT nasal spray Place 2 sprays into both nostrils daily.     Historical Provider, MD  gabapentin (NEURONTIN) 600 MG tablet Take 1,200 mg by mouth 3 (three) times daily.     Historical Provider, MD  glimepiride (AMARYL) 4 MG tablet Take 4 mg by mouth 2 (two) times daily.     Historical Provider, MD  glucagon (GLUCAGON EMERGENCY) 1 MG injection Inject 1 mg into the muscle once as needed.     Historical Provider, MD  imatinib (GLEEVEC) 400 MG tablet Take 1 tablet (400 mg total) by mouth daily. 02/09/17   Lequita Asal, MD  Insulin Glargine (LANTUS [facility]) Inject 70 Units into the skin daily.    Historical Provider, MD  insulin regular (NOVOLIN R,HUMULIN R) 100 units/mL injection Inject 65 Units into the skin 3 (three) times daily before meals.     Historical Provider, MD  Insulin Syringe-Needle U-100 (B-D INS SYR ULTRAFINE .3CC/30G) 30G X 1/2" 0.3 ML MISC Use 1 Syringe as directed.    Historical Provider, MD  Lancets 28G MISC Use 1 each 3 (three) times daily. Use as instructed. 07/28/16 07/28/17  Historical Provider, MD  losartan (COZAAR) 100 MG tablet Take 100 mg by mouth daily.     Historical Provider, MD  Melatonin 10 MG TABS Take 10 mg by mouth at bedtime.     Historical Provider, MD  metFORMIN (GLUCOPHAGE) 1000 MG tablet Take 1,000 mg by mouth 2 (two) times daily with a meal.     Historical Provider, MD  omeprazole (PRILOSEC) 20 MG capsule Take 20 mg by mouth 2 (two) times daily before a meal.     Historical Provider, MD  phenazopyridine (PYRIDIUM) 200 MG tablet Take 200 mg by mouth 2 (two) times daily.     Historical Provider, MD  tamsulosin (FLOMAX) 0.4 MG CAPS capsule Take 0.4 mg by mouth daily.     Historical Provider, MD  tiZANidine (ZANAFLEX) 4 MG capsule Take 4 mg by mouth daily.     Historical Provider,  MD  traMADol (ULTRAM) 50 MG tablet Take 50 mg by mouth 3 (three) times daily.  08/02/16   Historical Provider, MD  zolpidem (AMBIEN CR) 12.5 MG CR tablet Take 12.5 mg by mouth at bedtime.     Historical Provider, MD      VITAL SIGNS:  Blood pressure (!) 167/104, pulse (!) 101, resp. rate 16, weight (!) 373 lb (169.2 kg), SpO2 96 %.  PHYSICAL EXAMINATION:  Physical Exam  GENERAL:  46 y.o.-year-old patient lying in the bed with no acute distress. Morbid obese. EYES: Pupils equal, round, reactive to light and accommodation. No scleral icterus.  Extraocular muscles intact.  HEENT: Head atraumatic, normocephalic. Unable to see oropharynx and nasopharynx. Lymph nodes in both side of neck. NECK:  Supple, no jugular venous distention. No thyroid enlargement, no tenderness.  LUNGS: Normal breath sounds bilaterally, no wheezing, rales,rhonchi or crepitation. No use of accessory muscles of respiration.  CARDIOVASCULAR: S1, S2 normal. No murmurs, rubs, or gallops.  ABDOMEN: Soft, nontender, nondistended. Bowel sounds present. No organomegaly or mass.  EXTREMITIES: No pedal edema, cyanosis, or clubbing.  NEUROLOGIC: Cranial nerves II through XII are intact. Muscle strength 5/5 in all extremities. Sensation intact. Gait not checked.  PSYCHIATRIC: The patient is alert and oriented x 3.  SKIN: No obvious rash, lesion, or ulcer.   LABORATORY PANEL:   CBC  Recent Labs Lab 02/13/17 1430  WBC 8.7  HGB 10.5*  HCT 32.2*  PLT 281   ------------------------------------------------------------------------------------------------------------------  Chemistries   Recent Labs Lab 02/13/17 1430  NA 137  K 4.0  CL 103  CO2 27  GLUCOSE 222*  BUN 11  CREATININE 0.81  CALCIUM 8.8*   ------------------------------------------------------------------------------------------------------------------  Cardiac Enzymes No results for input(s): TROPONINI in the last 168  hours. ------------------------------------------------------------------------------------------------------------------  RADIOLOGY:  Ct Soft Tissue Neck W Contrast  Result Date: 02/13/2017 CLINICAL DATA:  46 year old male with neck swelling, bilateral palpable lateral neck masses. Status post inconclusive left neck lymph node biopsy 3 weeks ago. Trouble breathing today. EXAM: CT NECK WITH CONTRAST TECHNIQUE: Multidetector CT imaging of the neck was performed using the standard protocol following the bolus administration of intravenous contrast. CONTRAST:  81mL ISOVUE-300 IOPAMIDOL (ISOVUE-300) INJECTION 61% COMPARISON:  None. FINDINGS: Pharynx and larynx: Negative larynx; the glottis is closed. Hypopharynx is within normal limits. Mildly prominent bilateral palatine tonsils, although with speckled postinflammatory calcifications such that these are likely physiologic. There is increased symmetric appearing soft tissue in the nasopharynx suspicious for adenoid hypertrophy which would be atypical in this age group (series 2, image 69). Negative parapharyngeal and retropharyngeal spaces. Salivary glands: Negative sublingual space ; incidental left sublingual gland mylohyoid boutonniere (normal variant). The submandibular glands are normal aside from regional mass effect from bilateral abnormal cervical lymph nodes, worse on the left. See below. Bilateral parotid glands are within normal limits. Thyroid: Negative; subcentimeter left lobe hypodense nodule on series 2, image 82 which does not meet consensus criteria for ultrasound follow-up. Lymph nodes: Bulky solid-appearing left neck lymphadenopathy at the left level 2 nodal station with individual large rounded nodes measuring up to 44 mm long axis (27 mm short axis, series 2, image 38 and sagittal image 82). Mild mass effect on the posterior left submandibular gland. Elsewhere there are numerous abnormally rounded more mildly to moderately enlarged bilateral  cervical lymph nodes. Right level IIa node or small nodal conglomeration measures 19 mm short axis (series 2, image 40). Bilateral level 3 nodes are rounded measuring 12-15 mm. Smaller bilateral level 4 lymph nodes also appear increased in number and measure up to 11 mm short axis. At the right level 1 B nodal station there is a 19 mm round focus of inflammation which may be an inflamed node (series 2, image 43). Nearby enlarged but noninflamed appearing 15 mm lymph node. There is associated asymmetric thickening of the right platysma and subcutaneous inflammatory stranding which tracks inferiorly toward the midline strap muscles (series 2, image 56). No cystic or necrotic nodes identified. Vascular: Major vascular structures in the neck and at the skullbase are patent. Limited intracranial: Negative. Visualized orbits: Not included. Mastoids and visualized paranasal sinuses:  Mild left maxillary sinus mucous retention cyst. Otherwise clear. Skeleton: Occasional dental caries. Absent left posterior mandible dentition. Decreased bone detail in the lower cervical spine related to large body habitus. No acute or suspicious osseous lesion. Upper chest: Negative visualized lung parenchyma aside from possible mild centrilobular emphysema. Small but increased in number superior mediastinal lymph nodes. No axillary lymphadenopathy is visible. IMPRESSION: 1. Left greater than right cervical lymphadenopathy. The largest nodes are at the left level II station measuring up to 4.4 cm long axis (2.5-3 cm short axis). Smaller but abnormally rounded lymph nodes throughout the neck elsewhere. No cystic or necrotic nodes. Increased for age but symmetric appearing soft tissue at the nasopharynx. This constellation is most suspicious for Leukemia/lymphoma. Other lymphoproliferative disorders, including HIV, could also be considered. Nasopharyngeal carcinoma with lymph node metastases is less likely. The left neck nodes would be amenable  to Ultrasound-guided Needle Biopsy. 2. Superimposed nonspecific appearing inflammatory process at the right level 1B nodal station, with associated overlying cellulitis. No associated fluid collection. Electronically Signed   By: Genevie Ann M.D.   On: 02/13/2017 15:42      IMPRESSION AND PLAN:   Neck lymphadenopathy, Infectious mononucleosis Need to rule out lymphoma. The patient will be admitted to medical floor. Follow-up Dr. Mike Gip for biopsy. Continue steroid. Mono test is positive.  Hypertension. Continue home hypertension medication, hydralazine IV when necessary.  Diabetes. Start sliding scale and Lantus. OSA and morbid obesity. CPAP at night. Neuropathy. Continue gabapentin. COPD. Stable. NEB prn.  All the records are reviewed and case discussed with ED provider. Management plans discussed with the patient, his wife and they are in agreement.  CODE STATUS: Full code  TOTAL TIME TAKING CARE OF THIS PATIENT: 56 minutes.    Demetrios Loll M.D on 02/13/2017 at 4:29 PM  Between 7am to 6pm - Pager - [phone]  After 6pm go to [email or web address] - Proofreader  [facility]  Office  [phone]  CC: Primary care physician; Maryland Pink, MD   Note: This dictation was prepared with Dragon dictation along with smaller phrase technology. Any transcriptional errors that result from this process are unintentional.

## 2017-02-13 NOTE — ED Notes (Addendum)
Clarification to triage note: Pt states that he had swelling that started on the left side, pt had biopsy of lymph node on the left side that came back inconclusive.  Swelling started on the right side 3 days ago, pt reports that he also broke a tooth this past week when they were out of town. Pt reports that he has to hold his head a certain way so that he does not have trouble breathing. Pt reports more difficulty with swallowing that started yesterday.

## 2017-02-13 NOTE — ED Provider Notes (Signed)
[facility] Emergency Department Provider Note  ____________________________________________  Time seen: Approximately 2:37 PM  I have reviewed the triage vital signs and the nursing notes.   HISTORY  Chief Complaint Facial Swelling   HPI Dwayne Richard is a 46 y.o. male history of type 2 diabetes, sleep apnea on CPAP, COPDwho presents for evaluation of neck swelling. Patient had a large left lymph nodes that were diagnosed a month ago with a biopsy that was inconclusive. 3 days ago he started noticing swelling on the right side of his neck. The swelling has now gotten progressively worse on both sides and patient has been having difficulty breathing and swallowing since this morning. Has had chills but no fever. No CP, SOB, URI symptoms, sore throat.   Past Medical History:  Diagnosis Date  . COPD (chronic obstructive pulmonary disease) ([place])   . Depression   . Diabetes mellitus without complication ([place])   . GERD (gastroesophageal reflux disease)   . Hypertension   . Neuropathy ([facility])    from knees down, "tingling" in hands  . S/P insertion of spinal cord stimulator    (x2)  . Sleep apnea   . Systemic mastocytosis    Per pt    Patient Active Problem List   Diagnosis Date Noted  . Lymphadenopathy of head and neck   . Lymphoma ([place]) 02/13/2017  . Normocytic anemia 10/31/2016  . Systemic mastocytosis 08/22/2016  . Osteopenia 11/22/2012    Past Surgical History:  Procedure Laterality Date  . cataract surgery  08/06/2014  . cataract surgery  11/05/2014  . SEPTOPLASTY  12/15/2011  . Spinal implants  10/06/2011  . TYMPANOSTOMY TUBE PLACEMENT Right 11/03/2011  . X-STOP IMPLANTATION Right 07/25/2013    Prior to Admission medications   Medication Sig Start Date End Date Taking? Authorizing Provider  albuterol (PROVENTIL HFA;VENTOLIN HFA) 108 (90 Base) MCG/ACT inhaler Inhale 2 puffs into the lungs every 6 (six) hours as needed for wheezing.     Yes Historical Provider, MD  aspirin 500 MG tablet Take 500 mg by mouth 2 (two) times daily.   Yes Historical Provider, MD  atorvastatin (LIPITOR) 20 MG tablet Take 20 mg by mouth daily.    Yes Historical Provider, MD  Calcium Citrate 200 MG TABS Take 1,000 mg by mouth 2 (two) times daily.    Yes Historical Provider, MD  Cetirizine HCl 10 MG CAPS Take 10 mg by mouth 2 (two) times daily.    Yes Historical Provider, MD  cimetidine (TAGAMET) 400 MG tablet Take 400 mg by mouth 2 (two) times daily.    Yes Historical Provider, MD  cromolyn (GASTROCROM) 100 MG/5ML solution Take 200 mg by mouth 4 (four) times daily -  before meals and at bedtime.    Yes Historical Provider, MD  diphenhydrAMINE (BENADRYL) 50 MG capsule Take 50 mg by mouth 2 (two) times daily.    Yes Historical Provider, MD  EPINEPHrine 0.3 mg/0.3 mL IJ SOAJ injection Inject 0.3 mg into the muscle once.    Yes Historical Provider, MD  fluticasone (FLONASE) 50 MCG/ACT nasal spray Place 2 sprays into both nostrils daily.    Yes Historical Provider, MD  gabapentin (NEURONTIN) 600 MG tablet Take 1,200 mg by mouth 3 (three) times daily.    Yes Historical Provider, MD  glimepiride (AMARYL) 4 MG tablet Take 4 mg by mouth 2 (two) times daily.    Yes Historical Provider, MD  glucagon (GLUCAGON EMERGENCY) 1 MG injection Inject 1 mg into the  muscle once as needed.    Yes Historical Provider, MD  imatinib (GLEEVEC) 400 MG tablet Take 1 tablet (400 mg total) by mouth daily. 02/09/17  Yes Lequita Asal, MD  Insulin Glargine (LANTUS [place]) Inject 90 Units into the skin daily.    Yes Historical Provider, MD  insulin regular (NOVOLIN R,HUMULIN R) 100 units/mL injection Inject 80 Units into the skin 3 (three) times daily as needed.    Yes Historical Provider, MD  losartan (COZAAR) 100 MG tablet Take 100 mg by mouth daily.    Yes Historical Provider, MD  Melatonin 10 MG TABS Take 10 mg by mouth at bedtime.    Yes Historical Provider, MD  metFORMIN  (GLUCOPHAGE) 1000 MG tablet Take 2,000 mg by mouth daily with breakfast.    Yes Historical Provider, MD  Multiple Vitamin (MULTIVITAMIN) tablet Take 1 tablet by mouth daily.   Yes Historical Provider, MD  omeprazole (PRILOSEC) 20 MG capsule Take 20 mg by mouth 2 (two) times daily before a meal.    Yes Historical Provider, MD  phenazopyridine (PYRIDIUM) 200 MG tablet Take 200 mg by mouth 2 (two) times daily.    Yes Historical Provider, MD  tamsulosin (FLOMAX) 0.4 MG CAPS capsule Take 0.4 mg by mouth daily.    Yes Historical Provider, MD  tiZANidine (ZANAFLEX) 4 MG capsule Take 4 mg by mouth daily.    Yes Historical Provider, MD  traMADol (ULTRAM) 50 MG tablet Take 50 mg by mouth 3 (three) times daily.  08/02/16  Yes Historical Provider, MD  zolpidem (AMBIEN CR) 12.5 MG CR tablet Take 12.5 mg by mouth at bedtime.    Yes Historical Provider, MD  amoxicillin-clavulanate (AUGMENTIN) 875-125 MG tablet Take 1 tablet by mouth every 12 (twelve) hours. 02/15/17 02/22/17  [name], MD  Insulin Syringe-Needle U-100 (B-D INS SYR ULTRAFINE .3CC/30G) 30G X 1/2" 0.3 ML MISC Use 1 Syringe as directed.    Historical Provider, MD  Lancets 28G MISC Use 1 each 3 (three) times daily. Use as instructed. 07/28/16 07/28/17  Historical Provider, MD    Allergies Patient has no known allergies.  Family History  Problem Relation Age of Onset  . Diabetes Father   . Hypertension Father   . Diabetes Paternal Grandmother     Social History Social History  Substance Use Topics  . Smoking status: Former Smoker    Quit date: 11/22/2014  . Smokeless tobacco: Never Used     Comment: Patient smoked 24 years - 2 pks a day  . Alcohol use No    Review of Systems  Constitutional: Negative for fever. Eyes: Negative for visual changes. ENT: Negative for sore throat. Neck: + neck swelling Cardiovascular: Negative for chest pain. Respiratory: Negative for shortness of breath. Gastrointestinal: Negative for abdominal pain,  vomiting or diarrhea. Genitourinary: Negative for dysuria. Musculoskeletal: Negative for back pain. Skin: Negative for rash. Neurological: Negative for headaches, weakness or numbness. Psych: No SI or HI  ____________________________________________   PHYSICAL EXAM:  VITAL SIGNS: ED Triage Vitals  Enc Vitals Group     BP 02/13/17 1406 (!) 167/104     Pulse Rate 02/13/17 1406 (!) 101     Resp 02/13/17 1406 16     Temp --      Temp src --      SpO2 02/13/17 1406 96 %     Weight 02/13/17 1404 (!) 373 lb (169.2 kg)     Height --      Head Circumference --  Peak Flow --      Pain Score 02/13/17 1404 7     Pain Loc --      Pain Edu? --      Excl. in [place]? --     Constitutional: Alert and oriented. Well appearing and in no apparent distress. HEENT:      Head: Normocephalic and atraumatic.         Eyes: Conjunctivae are normal. Sclera is non-icteric. EOMI. PERRL      Mouth/Throat: Mucous membranes are moist. Trismus, thrush notice on tongue and cheeks, soft, non tender, non swollen floor of the mouth, oropharynx is clear.      Neck: Supple with no signs of meningismus. Large palpable lymph nodes bilaterally Cardiovascular: Regular rate and rhythm. No murmurs, gallops, or rubs. 2+ symmetrical distal pulses are present in all extremities. No JVD. Respiratory: Normal respiratory effort. Lungs are clear to auscultation bilaterally. No wheezes, crackles, or rhonchi.  Gastrointestinal: Soft, non tender, and non distended with positive bowel sounds. No rebound or guarding. Musculoskeletal: Nontender with normal range of motion in all extremities. No edema, cyanosis, or erythema of extremities. Neurologic: Normal speech and language. Face is symmetric. Moving all extremities. No gross focal neurologic deficits are appreciated. Skin: Skin is warm, dry and intact. No rash noted. Psychiatric: Mood and affect are normal. Speech and behavior are  normal.  ____________________________________________   LABS (all labs ordered are listed, but only abnormal results are displayed)  Labs Reviewed  CBC WITH DIFFERENTIAL/PLATELET - Abnormal; Notable for the following:       Result Value   RBC 3.48 (*)    Hemoglobin 10.5 (*)    HCT 32.2 (*)    RDW 15.6 (*)    All other components within normal limits  BASIC METABOLIC PANEL - Abnormal; Notable for the following:    Glucose, Bld 222 (*)    Calcium 8.8 (*)    All other components within normal limits  MONONUCLEOSIS SCREEN - Abnormal; Notable for the following:    Mono Screen POSITIVE (*)    All other components within normal limits  LACTATE DEHYDROGENASE - Abnormal; Notable for the following:    LDH 232 (*)    All other components within normal limits  GLUCOSE, CAPILLARY - Abnormal; Notable for the following:    Glucose-Capillary 319 (*)    All other components within normal limits  RETICULOCYTES - Abnormal; Notable for the following:    RBC. 3.60 (*)    All other components within normal limits  GLUCOSE, CAPILLARY - Abnormal; Notable for the following:    Glucose-Capillary 273 (*)    All other components within normal limits  GLUCOSE, CAPILLARY - Abnormal; Notable for the following:    Glucose-Capillary 305 (*)    All other components within normal limits  SEDIMENTATION RATE - Abnormal; Notable for the following:    Sed Rate 68 (*)    All other components within normal limits  GLUCOSE, CAPILLARY - Abnormal; Notable for the following:    Glucose-Capillary 262 (*)    All other components within normal limits  GLUCOSE, CAPILLARY - Abnormal; Notable for the following:    Glucose-Capillary 309 (*)    All other components within normal limits  GLUCOSE, CAPILLARY - Abnormal; Notable for the following:    Glucose-Capillary 329 (*)    All other components within normal limits  GLUCOSE, CAPILLARY - Abnormal; Notable for the following:    Glucose-Capillary 322 (*)    All other  components within normal  limits  URIC ACID  PROTIME-INR  APTT  FERRITIN  IRON AND TIBC  EPSTEIN-BARR VIRUS VCA, IGM  RAPID HIV SCREEN (HIV 1/2 AB+AG)  HISTOPLASMA ANTIGEN, URINE  CRYPTOCOCCUS ANTIGEN, SERUM  CMV DNA, QUANTITATIVE, PCR  MISC [facility] TEST (SEND OUT)  HIV ANTIBODY (ROUTINE TESTING)  TRYPTASE  FUNGAL ANTIBODIES PANEL, ID-BLOOD  POCT RAPID STREP A   ____________________________________________  EKG  none ____________________________________________  RADIOLOGY  CT neck:  1. Left greater than right cervical lymphadenopathy. The largest nodes are at the left level II station measuring up to 4.4 cm long axis (2.5-3 cm short axis). Smaller but abnormally rounded lymph nodes throughout the neck elsewhere. No cystic or necrotic nodes. Increased for age but symmetric appearing soft tissue at the nasopharynx. This constellation is most suspicious for Leukemia/lymphoma. Other lymphoproliferative disorders, including HIV, could also be considered. Nasopharyngeal carcinoma with lymph node metastases is less likely. The left neck nodes would be amenable to Ultrasound-guided Needle Biopsy. 2. Superimposed nonspecific appearing inflammatory process at the right level 1B nodal station, with associated overlying cellulitis. No associated fluid collection. ____________________________________________   PROCEDURES  Procedure(s) performed: None Procedures Critical Care performed:  None ____________________________________________   INITIAL IMPRESSION / ASSESSMENT AND PLAN / ED COURSE  47 y.o. male history of type 2 diabetes, sleep apnea on CPAP, COPDwho presents for evaluation of bilateral neck swelling. Now with trismus, difficulty swallowing and breathing. No difficulty breathing on exam, normal work of breathing, no stridor, normal sats, lungs are clear to auscultation. Patient does have trismus on exam. Floor of the mouth is soft and nontender with no evidence of  [place]'s angina. Neck is not erythematous or warm. Differential diagnoses including mass versus lymphadenopathy. CT pending to evaluate masses and location in the neck. Mono and strep pending. If these are lymph nodes will give abx, steroids and consult ENT.  Clinical Course as of Feb 15 1858  Sun Feb 13, 2017  1559 CT showing enlarged bilateral lymph nodes concerning for possible leukemia/ lymphoma. Discussed with Dr. Mike Gip who recommended admission for further evaluation. Will admit to Hospitalist.  [CV]    Clinical Course User Index [CV] Rudene Re, MD    Pertinent labs & imaging results that were available during my care of the patient were reviewed by me and considered in my medical decision making (see chart for details).    ____________________________________________   FINAL CLINICAL IMPRESSION(S) / ED DIAGNOSES  Final diagnoses:  Lymphadenopathy of head and neck      NEW MEDICATIONS STARTED DURING THIS VISIT:  Discharge Medication List as of 02/15/2017  2:09 PM    START taking these medications   Details  amoxicillin-clavulanate (AUGMENTIN) 875-125 MG tablet Take 1 tablet by mouth every 12 (twelve) hours., Starting Tue 02/15/2017, Until Tue 02/22/2017, Normal         Note:  This document was prepared using Dragon voice recognition software and may include unintentional dictation errors.    Rudene Re, MD 02/15/17 1900

## 2017-02-13 NOTE — Progress Notes (Signed)
Clarified with Dr Bridgett Larsson orders for lantus and lovenox. Lantus 90units due at bedtime,pt is NPO after midnight. Lovenox due,plan for procedure tomorrow. Per MD to give 45 units of lantus, hold lovenox. Pt can use own CPAP.

## 2017-02-14 ENCOUNTER — Inpatient Hospital Stay: Payer: BLUE CROSS/BLUE SHIELD

## 2017-02-14 DIAGNOSIS — R591 Generalized enlarged lymph nodes: Secondary | ICD-10-CM

## 2017-02-14 LAB — FERRITIN: Ferritin: 117 ng/mL (ref 24–336)

## 2017-02-14 LAB — GLUCOSE, CAPILLARY
GLUCOSE-CAPILLARY: 262 mg/dL — AB (ref 65–99)
Glucose-Capillary: 273 mg/dL — ABNORMAL HIGH (ref 65–99)
Glucose-Capillary: 305 mg/dL — ABNORMAL HIGH (ref 65–99)
Glucose-Capillary: 309 mg/dL — ABNORMAL HIGH (ref 65–99)

## 2017-02-14 LAB — RETICULOCYTES
RBC.: 3.6 MIL/uL — ABNORMAL LOW (ref 4.40–5.90)
Retic Count, Absolute: 108 10*3/uL (ref 19.0–183.0)
Retic Ct Pct: 3 % (ref 0.4–3.1)

## 2017-02-14 LAB — RAPID HIV SCREEN (HIV 1/2 AB+AG)
HIV 1/2 ANTIBODIES: NONREACTIVE
HIV-1 P24 Antigen - HIV24: NONREACTIVE

## 2017-02-14 LAB — IRON AND TIBC
Iron: 73 ug/dL (ref 45–182)
Saturation Ratios: 19 % (ref 17.9–39.5)
TIBC: 382 ug/dL (ref 250–450)
UIBC: 310 ug/dL

## 2017-02-14 MED ORDER — BOOST / RESOURCE BREEZE PO LIQD
1.0000 | Freq: Two times a day (BID) | ORAL | Status: DC
  Administered 2017-02-14 – 2017-02-15 (×2): 1 via ORAL

## 2017-02-14 MED ORDER — UNJURY CHICKEN SOUP POWDER
2.0000 [oz_av] | Freq: Two times a day (BID) | ORAL | Status: DC
  Administered 2017-02-14: 2 [oz_av] via ORAL

## 2017-02-14 NOTE — Consult Note (Signed)
[facility]  Date of admission:  02/13/2017  Inpatient day:  02/14/2017  Consulting physician:  Dr. Demetrios Loll   Reason for Consultation:  Lymphadenopathy  Chief Complaint: Dwayne Richard is a 46 y.o. 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  HPI:  The patient has been followed in the oncology clinic since 08/23/2016 since 08/2016.  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  His disease has been controlled with cetirizine (Zyrtec), cimetidine (Tagamet), cromolyn (Gastrocrom), and Gleevec (400 mg a day).  He uses an epinephrine pen 3-4 times a year.  He was scheduled for follow-up every 3 months.  He is at risk for disease transformation including acute hematologic neoplasms (myeloid leukemia, lymphoma).  He did not make his appointment on 12/23/2016.  He has been followed by Dr Josue Hector in the endocrinology clinic.  He was seen on 01/07/2017.  At that time, he noted neck swelling and discomfort x 3 weeks.  He denied any recent cough, cold or flu-like symptoms. Ultrasound of the neck on 12/31/2016 revealed 2 left sided cystic nodules (1-1.78 cm) and 2 adjacent masses measuring 3.46 cm and 3.66 cm.  On 01/13/2017, he underwent ultrasound guided fine needle aspirate of a left neck node by Dr. Gabriel Carina.  Pathology revealed a monotonous lymphoid population.  The patient presented to [facility] ER on 02/13/2017 with headache, low grade fever, sweating, neck swelling, and shortness of breath.    Neck CT on 02/13/2017 revealed bulky (left > right) cervical lymphadenopathy. The largest nodes were at the left level II station measuring up to 4.4 cm long axis (2.5-3 cm short axis).  There were smaller but abnormally rounded lymph nodes throughout the neck elsewhere.  There were no cystic or necrotic nodes.  There was increased for age but symmetric appearing soft tissue at  the nasopharynx.  There was superimposed nonspecific appearing inflammatory process at the right level 1B nodal station, with associated overlying cellulitis.  There was no associated fluid collection.  Labs revealed a hematocrit of 32.2, hemoglobin 10.5, MCV 92.6, platelets 281,000, WBC 8700 with an ANC of 5000.  Differential was unremarkable.  Creatinine was 0.81.  Uric acid was 5.2 (normal).  LDH was 232 (slightly elevated).  Monospot was positive.  He was started on Unasyn and Decadron (4 mg po q 6 hours).  Symptomatically, he notes drenching sweats for the past 1 1/2 months.  He gets up nightly to change his clothes.  He notes low grade fevers.  He has lost 23 pounds since 11/2016.    Past Medical History:  Diagnosis Date  . COPD (chronic obstructive pulmonary disease) ([place])   . Depression   . Diabetes mellitus without complication ([place])   . Hypertension   . Neuropathy ([place])   . Sleep apnea   . Systemic mastocytosis    Per pt    Past Surgical History:  Procedure Laterality Date  . cataract surgery  08/06/2014  . cataract surgery  11/05/2014  . SEPTOPLASTY  12/15/2011  . Spinal implants  10/06/2011  . TYMPANOSTOMY TUBE PLACEMENT Right 11/03/2011  . X-STOP IMPLANTATION Right 07/25/2013    Family History  Problem Relation Age of Onset  . Diabetes Father   . Hypertension Father   . Diabetes Paternal Grandmother     Social History:  reports that he quit smoking about 2 years ago. He has never used smokeless tobacco. He reports that he does not  drink alcohol or use drugs.  The patient is alone today.  Allergies: No Known Allergies  Medications Prior to Admission  Medication Sig Dispense Refill  . albuterol (PROVENTIL HFA;VENTOLIN HFA) 108 (90 Base) MCG/ACT inhaler Inhale 2 puffs into the lungs every 6 (six) hours as needed for wheezing.     Marland Kitchen aspirin 500 MG tablet Take 500 mg by mouth 2 (two) times daily.    Marland Kitchen atorvastatin (LIPITOR) 20 MG tablet Take 20 mg by mouth daily.      . Calcium Citrate 200 MG TABS Take 1,000 mg by mouth 2 (two) times daily.     . Cetirizine HCl 10 MG CAPS Take 10 mg by mouth 2 (two) times daily.     . cimetidine (TAGAMET) 400 MG tablet Take 400 mg by mouth 2 (two) times daily.     . cromolyn (GASTROCROM) 100 MG/5ML solution Take 200 mg by mouth 4 (four) times daily -  before meals and at bedtime.     . diphenhydrAMINE (BENADRYL) 50 MG capsule Take 50 mg by mouth 2 (two) times daily.     Marland Kitchen EPINEPHrine 0.3 mg/0.3 mL IJ SOAJ injection Inject 0.3 mg into the muscle once.     . fluticasone (FLONASE) 50 MCG/ACT nasal spray Place 2 sprays into both nostrils daily.     Marland Kitchen gabapentin (NEURONTIN) 600 MG tablet Take 1,200 mg by mouth 3 (three) times daily.     Marland Kitchen glimepiride (AMARYL) 4 MG tablet Take 4 mg by mouth 2 (two) times daily.     Marland Kitchen glucagon (GLUCAGON EMERGENCY) 1 MG injection Inject 1 mg into the muscle once as needed.     . imatinib (GLEEVEC) 400 MG tablet Take 1 tablet (400 mg total) by mouth daily. 30 tablet 0  . Insulin Glargine (LANTUS [place]) Inject 90 Units into the skin daily.     . insulin regular (NOVOLIN R,HUMULIN R) 100 units/mL injection Inject 80 Units into the skin 3 (three) times daily as needed.     Marland Kitchen losartan (COZAAR) 100 MG tablet Take 100 mg by mouth daily.     . Melatonin 10 MG TABS Take 10 mg by mouth at bedtime.     . metFORMIN (GLUCOPHAGE) 1000 MG tablet Take 2,000 mg by mouth daily with breakfast.     . Multiple Vitamin (MULTIVITAMIN) tablet Take 1 tablet by mouth daily.    Marland Kitchen omeprazole (PRILOSEC) 20 MG capsule Take 20 mg by mouth 2 (two) times daily before a meal.     . phenazopyridine (PYRIDIUM) 200 MG tablet Take 200 mg by mouth 2 (two) times daily.     . tamsulosin (FLOMAX) 0.4 MG CAPS capsule Take 0.4 mg by mouth daily.     Marland Kitchen tiZANidine (ZANAFLEX) 4 MG capsule Take 4 mg by mouth daily.     . traMADol (ULTRAM) 50 MG tablet Take 50 mg by mouth 3 (three) times daily.     Marland Kitchen zolpidem (AMBIEN CR) 12.5 MG CR tablet Take  12.5 mg by mouth at bedtime.     Marland Kitchen zolpidem (AMBIEN) 10 MG tablet Take 10 mg by mouth at bedtime.    . Insulin Syringe-Needle U-100 (B-D INS SYR ULTRAFINE .3CC/30G) 30G X 1/2" 0.3 ML MISC Use 1 Syringe as directed.    . Lancets 28G MISC Use 1 each 3 (three) times daily. Use as instructed.      Review of Systems: GENERAL:  Fatigue.  Low grade fevers x 1 1/2 months (max 101.x).  Drenching night  sweats.  Weight loss of 23 pounds since 11/2016.  PERFORMANCE STATUS (ECOG):  1 HEENT:  Sinus symptoms.  No visual changes, runny nose, sore throat, mouth sores or tenderness. Lungs:  Shortness of breath on exertion (chronic).  No cough.  No hemoptysis. Cardiac:  No chest pain, palpitations, orthopnea, or PND. GI:  Trouble swallowing.  No nausea, vomiting, diarrhea, constipation, melena or hematochezia. GU:  No urgency, frequency, dysuria, or hematuria. Musculoskeletal:  No back pain.  No joint pain.  No muscle tenderness. Extremities:  No pain or swelling. Skin:  No recent flares or urticaria. Urticaria pigmentosa lesions more prominent. Neuro:  No headache, numbness or weakness, balance or coordination issues. Endocrine:  Diabetes.  No thyroid issues, hot flashes or night sweats. Lymph nodes:  Notes lymph nodes smaller since hospitalization post steroids. Psych:  No mood changes, depression or anxiety. Pain:  No focal pain. Review of systems:  All other systems reviewed and found to be negative.  Physical Exam:  Blood pressure 137/79, pulse 97, temperature 98.2 F (36.8 C), temperature source Oral, resp. rate 20, height 6' 3"  (1.905 m), weight (!) 373 lb (169.2 kg), SpO2 92 %.  GENERAL:Well developed, well nourished, overweight gentleman sitting comfortably on the medical unit in no acute distress. MENTAL STATUS: Alert and oriented to person, place and time. HEAD:Short crewcut. Normocephalic, atraumatic, face symmetric, no Cushingoid features. EYES:Glasses. Pupils equal round and  reactive to light and accomodation. No conjunctivitis or scleral icterus. YBO:FBPZWCHENI clear without lesion. Tonguenormal.  Tonsils without erythema or exudate.  Mucous membranes moist. RESPIRATORY:Clear to auscultationwithout rales, wheezes or rhonchi. CARDIOVASCULAR:Regular rate andrhythmwithout murmur, rub or gallop. ABDOMEN:Soft, slightly tender and full left upper quadrant.  Exam difficult given patient's size.  Active bowel sounds, and no appreciable hepatosplenomegaly. No masses. SKIN: Urticaria pigmentosa. No dermatographia.  EXTREMITIES: No edema, no skin discoloration or tenderness. No palpable cords. LYMPHNODES: Marked left sided 3-4 cm adenopathy below SCM. Additional smaller nodes on left side.  Tender smaller right sided cervical nodes.  No palpable supraclavicular, axillary or inguinal adenopathy  NEUROLOGICAL: Unremarkable. PSYCH: Appropriate.   Results for orders placed or performed during the hospital encounter of 02/13/17 (from the past 48 hour(s))  CBC with Differential/Platelet     Status: Abnormal   Collection Time: 02/13/17  2:30 PM  Result Value Ref Range   WBC 8.7 3.8 - 10.6 K/uL   RBC 3.48 (L) 4.40 - 5.90 MIL/uL   Hemoglobin 10.5 (L) 13.0 - 18.0 g/dL   HCT 32.2 (L) 40.0 - 52.0 %   MCV 92.6 80.0 - 100.0 fL   MCH 30.1 26.0 - 34.0 pg   MCHC 32.5 32.0 - 36.0 g/dL   RDW 15.6 (H) 11.5 - 14.5 %   Platelets 281 150 - 440 K/uL   Neutrophils Relative % 58 %   Neutro Abs 5.0 1.4 - 6.5 K/uL   Lymphocytes Relative 30 %   Lymphs Abs 2.7 1.0 - 3.6 K/uL   Monocytes Relative 10 %   Monocytes Absolute 0.9 0.2 - 1.0 K/uL   Eosinophils Relative 2 %   Eosinophils Absolute 0.2 0 - 0.7 K/uL   Basophils Relative 0 %   Basophils Absolute 0.0 0 - 0.1 K/uL  Basic metabolic panel     Status: Abnormal   Collection Time: 02/13/17  2:30 PM  Result Value Ref Range   Sodium 137 135 - 145 mmol/L   Potassium 4.0 3.5 - 5.1 mmol/L   Chloride 103 101 - 111 mmol/L  CO2 27 22 - 32 mmol/L   Glucose, Bld 222 (H) 65 - 99 mg/dL   BUN 11 6 - 20 mg/dL   Creatinine, Ser 0.81 0.61 - 1.24 mg/dL   Calcium 8.8 (L) 8.9 - 10.3 mg/dL   GFR calc non Af Amer >60 >60 mL/min   GFR calc Af Amer >60 >60 mL/min    Comment: (NOTE) The eGFR has been calculated using the CKD EPI equation. This calculation has not been validated in all clinical situations. eGFR's persistently <60 mL/min signify possible Chronic Kidney Disease.    Anion gap 7 5 - 15  Mononucleosis screen     Status: Abnormal   Collection Time: 02/13/17  3:46 PM  Result Value Ref Range   Mono Screen POSITIVE (A) NEGATIVE  POCT rapid strep A [facility])     Status: None   Collection Time: 02/13/17  3:56 PM  Result Value Ref Range   Streptococcus, Group A Screen (Direct) NEGATIVE NEGATIVE  Lactate dehydrogenase     Status: Abnormal   Collection Time: 02/13/17  4:13 PM  Result Value Ref Range   LDH 232 (H) 98 - 192 U/L  Uric acid     Status: None   Collection Time: 02/13/17  4:13 PM  Result Value Ref Range   Uric Acid, Serum 5.2 4.4 - 7.6 mg/dL  Protime-INR     Status: None   Collection Time: 02/13/17  4:13 PM  Result Value Ref Range   Prothrombin Time 13.9 11.4 - 15.2 seconds   INR 1.07   APTT     Status: None   Collection Time: 02/13/17  4:13 PM  Result Value Ref Range   aPTT 30 24 - 36 seconds  Glucose, capillary     Status: Abnormal   Collection Time: 02/13/17  8:40 PM  Result Value Ref Range   Glucose-Capillary 319 (H) 65 - 99 mg/dL   Ct Soft Tissue Neck W Contrast  Result Date: 02/13/2017 CLINICAL DATA:  46 year old male with neck swelling, bilateral palpable lateral neck masses. Status post inconclusive left neck lymph node biopsy 3 weeks ago. Trouble breathing today. EXAM: CT NECK WITH CONTRAST TECHNIQUE: Multidetector CT imaging of the neck was performed using the standard protocol following the bolus administration of intravenous contrast. CONTRAST:  103m ISOVUE-300 IOPAMIDOL  (ISOVUE-300) INJECTION 61% COMPARISON:  None. FINDINGS: Pharynx and larynx: Negative larynx; the glottis is closed. Hypopharynx is within normal limits. Mildly prominent bilateral palatine tonsils, although with speckled postinflammatory calcifications such that these are likely physiologic. There is increased symmetric appearing soft tissue in the nasopharynx suspicious for adenoid hypertrophy which would be atypical in this age group (series 2, image 145. Negative parapharyngeal and retropharyngeal spaces. Salivary glands: Negative sublingual space ; incidental left sublingual gland mylohyoid boutonniere (normal variant). The submandibular glands are normal aside from regional mass effect from bilateral abnormal cervical lymph nodes, worse on the left. See below. Bilateral parotid glands are within normal limits. Thyroid: Negative; subcentimeter left lobe hypodense nodule on series 2, image 82 which does not meet consensus criteria for ultrasound follow-up. Lymph nodes: Bulky solid-appearing left neck lymphadenopathy at the left level 2 nodal station with individual large rounded nodes measuring up to 44 mm long axis (27 mm short axis, series 2, image 38 and sagittal image 82). Mild mass effect on the posterior left submandibular gland. Elsewhere there are numerous abnormally rounded more mildly to moderately enlarged bilateral cervical lymph nodes. Right level IIa node or small nodal conglomeration measures  19 mm short axis (series 2, image 40). Bilateral level 3 nodes are rounded measuring 12-15 mm. Smaller bilateral level 4 lymph nodes also appear increased in number and measure up to 11 mm short axis. At the right level 1 B nodal station there is a 19 mm round focus of inflammation which may be an inflamed node (series 2, image 43). Nearby enlarged but noninflamed appearing 15 mm lymph node. There is associated asymmetric thickening of the right platysma and subcutaneous inflammatory stranding which tracks  inferiorly toward the midline strap muscles (series 2, image 56). No cystic or necrotic nodes identified. Vascular: Major vascular structures in the neck and at the skullbase are patent. Limited intracranial: Negative. Visualized orbits: Not included. Mastoids and visualized paranasal sinuses: Mild left maxillary sinus mucous retention cyst. Otherwise clear. Skeleton: Occasional dental caries. Absent left posterior mandible dentition. Decreased bone detail in the lower cervical spine related to large body habitus. No acute or suspicious osseous lesion. Upper chest: Negative visualized lung parenchyma aside from possible mild centrilobular emphysema. Small but increased in number superior mediastinal lymph nodes. No axillary lymphadenopathy is visible. IMPRESSION: 1. Left greater than right cervical lymphadenopathy. The largest nodes are at the left level II station measuring up to 4.4 cm long axis (2.5-3 cm short axis). Smaller but abnormally rounded lymph nodes throughout the neck elsewhere. No cystic or necrotic nodes. Increased for age but symmetric appearing soft tissue at the nasopharynx. This constellation is most suspicious for Leukemia/lymphoma. Other lymphoproliferative disorders, including HIV, could also be considered. Nasopharyngeal carcinoma with lymph node metastases is less likely. The left neck nodes would be amenable to Ultrasound-guided Needle Biopsy. 2. Superimposed nonspecific appearing inflammatory process at the right level 1B nodal station, with associated overlying cellulitis. No associated fluid collection. Electronically Signed   By: Genevie Ann M.D.   On: 02/13/2017 15:42    Assessment:  The patient is a 46 y.o. gentleman with systemic mastocytosis and progressive neck adenopathy since late 11/2016.  He is at risk for disease transformation including acute hematologic neoplasms (myeloid leukemia, lymphomas).  Fine needle aspirate on 01/13/2017 revealed a monotonous lymphoid  population.  Neck CT on 02/13/2017 revealed bulky (left > right) cervical lymphadenopathy. The largest nodes were at the left level II station measuring up to 4.4 cm long axis (2.5-3 cm short axis).  There were smaller but abnormally rounded lymph nodes throughout the neck elsewhere.  There were no cystic or necrotic nodes.  There was superimposed nonspecific appearing inflammatory process at the right level 1B nodal station, with associated overlying cellulitis.  There was no associated fluid collection.  Chest CT on 02/14/2017 revealed scattered upper normal to mildly enlarged lymph nodes in the mediastinum and upper abdomen come nonspecific for reactive versus neoplastic etiology. An index right hilar lymph node measures 1.1 cm in short axis.  Symptomatically, he has had progressive neck adenopathy and B symptoms (daily low grade fevers, drenching night sweats x 1 1/2 months requiring change of clothes, and a 23 pound unexplained weight loss since 11/2016).  Plan:   1.  Oncology:  Patient with systemic mastocytosis and concern for disease progression.  Chest CT today was performed for symptoms of shortness of breath and dysphagia to assess any chest adenopathy.  He notes improvement in adenopathy since initiation of steroids (likely partial treatment for lymphoma).  Discussed concern for lymphoma given 2 month history of increasing neck adenopathy and significant B symptoms.  Patients with mastocytosis are at risk for disease transformation including  acute hematologic neoplasms (myeloid leukemia, lymphoma). Cases have included aggressive B cell lymphomas and Hodgkin's disease.    Discussed excisional left neck biopsy for lymph node architecture, immunohistochemistry, flow cytometry, FISH, and cytogenetics for diagnosis.  Discussed with Dr. Kathyrn Sheriff plan for biopsy ASAP.  If biopsy positive for lymphoma, patient will require further work-up (bone marrow aspirate and PET scan).  As patient not having  any current airway issues and no bulky adenopathy in chest, would taper Decadron to off.  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  2.  Infectious disease:  Patient has a + monospot.  Although monospot has a high degree of sensitivity and specificity, there can be false positives in the setting of leukemia and lymphoma.  Typicially patients with mono have lymphadenopathy that peaks within a week and subsides in 2-3 weeks.  Adenopathy has been progressive over 8 weeks.  Continue Unasyn for cellulitis while an inpatient.  Anticipate outpatient  Augmentin when patient is discharged from the hospital.  Thank you for allowing me to participate in Dwayne Richard 's care.  I will follow him closely with you while hospitalized and after discharge in the outpatient department.   Lequita Asal, MD  02/14/2017

## 2017-02-14 NOTE — Consult Note (Signed)
[facility] Infectious Disease     Reason for Consult: lymphadenopathy   Referring Physician: [name] Date of Admission:  02/13/2017   Active Problems:   [facility])   HPI: Dwayne Richard is a 46 y.o. male admitted with 1 month nontender  L neck swelling, now progressed to tender  R side. He has hx long standing mastocytosis and follows with oncology, is on gleevac. He reports chipping a tooth a week ago and then started to develop R sided tender LAN.  He has not had any pharyngitis recently, lives with his wife and adult children.  Is retired from [employer]. Hasn't worked otherwise in many years. No pets at home. Travel to [place] and to [place] recently but no outdoor activity or sick contacts. No exposure to TB he is aware of and has had neg TB tests in past. He has no thad any wt loss, some low grade temps maybe.   Past Medical History:  Diagnosis Date  . COPD (chronic obstructive pulmonary disease) ([place])   . Depression   . Diabetes mellitus without complication ([place])   . Hypertension   . Neuropathy ([place])   . Sleep apnea   . Systemic mastocytosis    Per pt   Past Surgical History:  Procedure Laterality Date  . cataract surgery  08/06/2014  . cataract surgery  11/05/2014  . SEPTOPLASTY  12/15/2011  . Spinal implants  10/06/2011  . TYMPANOSTOMY TUBE PLACEMENT Right 11/03/2011  . X-STOP IMPLANTATION Right 07/25/2013   Social History  Substance Use Topics  . Smoking status: Former Smoker    Quit date: 11/22/2014  . Smokeless tobacco: Never Used     Comment: Patient smoked 24 years - 2 pks a day  . Alcohol use No   Family History  Problem Relation Age of Onset  . Diabetes Father   . Hypertension Father   . Diabetes Paternal Grandmother     Allergies: No Known Allergies  Current antibiotics: Antibiotics Given (last 72 hours)    None      MEDICATIONS: . atorvastatin  20 mg Oral QPM  . chlorhexidine  15 mL Mouth Rinse BID  . dexamethasone  4 mg Intravenous  Q6H  . diphenhydrAMINE  50 mg Oral BID  . enoxaparin (LOVENOX) injection  40 mg Subcutaneous Q12H  . famotidine  20 mg Oral BID  . fluticasone  2 spray Each Nare Daily  . gabapentin  1,200 mg Oral TID  . imatinib  400 mg Oral Daily  . insulin aspart  0-5 Units Subcutaneous QHS  . insulin aspart  0-9 Units Subcutaneous TID WC  . insulin glargine  90 Units Subcutaneous QHS  . loratadine  10 mg Oral Daily  . losartan  100 mg Oral Daily  . mouth rinse  15 mL Mouth Rinse q12n4p  . Melatonin  10 mg Oral QHS  . pantoprazole  40 mg Oral Daily  . phenazopyridine  200 mg Oral BID  . sodium chloride flush  3 mL Intravenous Q12H  . tamsulosin  0.4 mg Oral Daily  . tiZANidine  4 mg Oral BID AC & HS  . traMADol  50 mg Oral TID  . zolpidem  10 mg Oral QHS    Review of Systems - 11 systems reviewed and negative per HPI   OBJECTIVE: Temp:  [97.5 F (36.4 C)-98.3 F (36.8 C)] 98 F (36.7 C) (03/26 1253) Pulse Rate:  [76-97] 76 (03/26 1253) Resp:  [18-20] 18 (03/26 1253) BP: (118-171)/(66-118) 118/66 (  03/26 1253) SpO2:  [92 %-95 %] 94 % (03/26 1253) Physical Exam  Constitutional: He is oriented to person, place, and time. Morbidly obese HENT: anicteric  Mouth/Throat: Oropharynx is clear and dry, Has a R tooth which is chipped.  No oropharyngeal exudate.  L ant lymph nodes feel massively enlarged and somewhat matted, there appears to be some induration. He has mild TTP there. R side with less enlargement but more tender.  Cardiovascular: Normal rate, regular rhythm and normal heart sounds.Pulmonary/Chest: Effort normal and breath sounds normal. No respiratory distress. He has no wheezes.  Abdominal: Soft. Bowel sounds are normal. He exhibits no distension. There is no tenderness.  Neurological: He is alert and oriented to person, place, and time.  Skin: Skin is warm and dry. No rash noted. No erythema.  Psychiatric: He has a normal mood and affect. His behavior is normal.      LABS: Results for orders placed or performed during the hospital encounter of 02/13/17 (from the past 48 hour(s))  CBC with Differential/Platelet     Status: Abnormal   Collection Time: 02/13/17  2:30 PM  Result Value Ref Range   WBC 8.7 3.8 - 10.6 K/uL   RBC 3.48 (L) 4.40 - 5.90 MIL/uL   Hemoglobin 10.5 (L) 13.0 - 18.0 g/dL   HCT 32.2 (L) 40.0 - 52.0 %   MCV 92.6 80.0 - 100.0 fL   MCH 30.1 26.0 - 34.0 pg   MCHC 32.5 32.0 - 36.0 g/dL   RDW 15.6 (H) 11.5 - 14.5 %   Platelets 281 150 - 440 K/uL   Neutrophils Relative % 58 %   Neutro Abs 5.0 1.4 - 6.5 K/uL   Lymphocytes Relative 30 %   Lymphs Abs 2.7 1.0 - 3.6 K/uL   Monocytes Relative 10 %   Monocytes Absolute 0.9 0.2 - 1.0 K/uL   Eosinophils Relative 2 %   Eosinophils Absolute 0.2 0 - 0.7 K/uL   Basophils Relative 0 %   Basophils Absolute 0.0 0 - 0.1 K/uL  Basic metabolic panel     Status: Abnormal   Collection Time: 02/13/17  2:30 PM  Result Value Ref Range   Sodium 137 135 - 145 mmol/L   Potassium 4.0 3.5 - 5.1 mmol/L   Chloride 103 101 - 111 mmol/L   CO2 27 22 - 32 mmol/L   Glucose, Bld 222 (H) 65 - 99 mg/dL   BUN 11 6 - 20 mg/dL   Creatinine, Ser 0.81 0.61 - 1.24 mg/dL   Calcium 8.8 (L) 8.9 - 10.3 mg/dL   GFR calc non Af Amer >60 >60 mL/min   GFR calc Af Amer >60 >60 mL/min    Comment: (NOTE) The eGFR has been calculated using the CKD EPI equation. This calculation has not been validated in all clinical situations. eGFR's persistently <60 mL/min signify possible Chronic Kidney Disease.    Anion gap 7 5 - 15  Mononucleosis screen     Status: Abnormal   Collection Time: 02/13/17  3:46 PM  Result Value Ref Range   Mono Screen POSITIVE (A) NEGATIVE  POCT rapid strep A [facility])     Status: None   Collection Time: 02/13/17  3:56 PM  Result Value Ref Range   Streptococcus, Group A Screen (Direct) NEGATIVE NEGATIVE  Lactate dehydrogenase     Status: Abnormal   Collection Time: 02/13/17  4:13 PM  Result  Value Ref Range   LDH 232 (H) 98 - 192 U/L  Uric  acid     Status: None   Collection Time: 02/13/17  4:13 PM  Result Value Ref Range   Uric Acid, Serum 5.2 4.4 - 7.6 mg/dL  Protime-INR     Status: None   Collection Time: 02/13/17  4:13 PM  Result Value Ref Range   Prothrombin Time 13.9 11.4 - 15.2 seconds   INR 1.07   APTT     Status: None   Collection Time: 02/13/17  4:13 PM  Result Value Ref Range   aPTT 30 24 - 36 seconds  Glucose, capillary     Status: Abnormal   Collection Time: 02/13/17  8:40 PM  Result Value Ref Range   Glucose-Capillary 319 (H) 65 - 99 mg/dL  Reticulocytes     Status: Abnormal   Collection Time: 02/14/17  5:08 AM  Result Value Ref Range   Retic Ct Pct 3.0 0.4 - 3.1 %   RBC. 3.60 (L) 4.40 - 5.90 MIL/uL   Retic Count, Manual 108.0 19.0 - 183.0 K/uL  Ferritin     Status: None   Collection Time: 02/14/17  5:08 AM  Result Value Ref Range   Ferritin 117 24 - 336 ng/mL  Iron and TIBC     Status: None   Collection Time: 02/14/17  5:08 AM  Result Value Ref Range   Iron 73 45 - 182 ug/dL   TIBC 382 250 - 450 ug/dL   Saturation Ratios 19 17.9 - 39.5 %   UIBC 310 ug/dL  Glucose, capillary     Status: Abnormal   Collection Time: 02/14/17  7:38 AM  Result Value Ref Range   Glucose-Capillary 273 (H) 65 - 99 mg/dL  Glucose, capillary     Status: Abnormal   Collection Time: 02/14/17 11:49 AM  Result Value Ref Range   Glucose-Capillary 305 (H) 65 - 99 mg/dL   No components found for: ESR, C REACTIVE PROTEIN MICRO: No results found for this or any previous visit (from the past 720 hour(s)).  IMAGING: Ct Soft Tissue Neck W Contrast  Result Date: 02/13/2017 CLINICAL DATA:  46 year old male with neck swelling, bilateral palpable lateral neck masses. Status post inconclusive left neck lymph node biopsy 3 weeks ago. Trouble breathing today. EXAM: CT NECK WITH CONTRAST TECHNIQUE: Multidetector CT imaging of the neck was performed using the standard protocol  following the bolus administration of intravenous contrast. CONTRAST:  50m ISOVUE-300 IOPAMIDOL (ISOVUE-300) INJECTION 61% COMPARISON:  None. FINDINGS: Pharynx and larynx: Negative larynx; the glottis is closed. Hypopharynx is within normal limits. Mildly prominent bilateral palatine tonsils, although with speckled postinflammatory calcifications such that these are likely physiologic. There is increased symmetric appearing soft tissue in the nasopharynx suspicious for adenoid hypertrophy which would be atypical in this age group (series 2, image 147. Negative parapharyngeal and retropharyngeal spaces. Salivary glands: Negative sublingual space ; incidental left sublingual gland mylohyoid boutonniere (normal variant). The submandibular glands are normal aside from regional mass effect from bilateral abnormal cervical lymph nodes, worse on the left. See below. Bilateral parotid glands are within normal limits. Thyroid: Negative; subcentimeter left lobe hypodense nodule on series 2, image 82 which does not meet consensus criteria for ultrasound follow-up. Lymph nodes: Bulky solid-appearing left neck lymphadenopathy at the left level 2 nodal station with individual large rounded nodes measuring up to 44 mm long axis (27 mm short axis, series 2, image 38 and sagittal image 82). Mild mass effect on the posterior left submandibular gland. Elsewhere there are numerous abnormally rounded more mildly  to moderately enlarged bilateral cervical lymph nodes. Right level IIa node or small nodal conglomeration measures 19 mm short axis (series 2, image 40). Bilateral level 3 nodes are rounded measuring 12-15 mm. Smaller bilateral level 4 lymph nodes also appear increased in number and measure up to 11 mm short axis. At the right level 1 B nodal station there is a 19 mm round focus of inflammation which may be an inflamed node (series 2, image 43). Nearby enlarged but noninflamed appearing 15 mm lymph node. There is associated  asymmetric thickening of the right platysma and subcutaneous inflammatory stranding which tracks inferiorly toward the midline strap muscles (series 2, image 56). No cystic or necrotic nodes identified. Vascular: Major vascular structures in the neck and at the skullbase are patent. Limited intracranial: Negative. Visualized orbits: Not included. Mastoids and visualized paranasal sinuses: Mild left maxillary sinus mucous retention cyst. Otherwise clear. Skeleton: Occasional dental caries. Absent left posterior mandible dentition. Decreased bone detail in the lower cervical spine related to large body habitus. No acute or suspicious osseous lesion. Upper chest: Negative visualized lung parenchyma aside from possible mild centrilobular emphysema. Small but increased in number superior mediastinal lymph nodes. No axillary lymphadenopathy is visible. IMPRESSION: 1. Left greater than right cervical lymphadenopathy. The largest nodes are at the left level II station measuring up to 4.4 cm long axis (2.5-3 cm short axis). Smaller but abnormally rounded lymph nodes throughout the neck elsewhere. No cystic or necrotic nodes. Increased for age but symmetric appearing soft tissue at the nasopharynx. This constellation is most suspicious for Leukemia/lymphoma. Other lymphoproliferative disorders, including HIV, could also be considered. Nasopharyngeal carcinoma with lymph node metastases is less likely. The left neck nodes would be amenable to Ultrasound-guided Needle Biopsy. 2. Superimposed nonspecific appearing inflammatory process at the right level 1B nodal station, with associated overlying cellulitis. No associated fluid collection. Electronically Signed   By: Genevie Ann M.D.   On: 02/13/2017 15:42   Ct Chest Wo Contrast  Result Date: 02/14/2017 CLINICAL DATA:  Neck adenopathy and swelling, worsening. EXAM: CT CHEST WITHOUT CONTRAST TECHNIQUE: Multidetector CT imaging of the chest was performed following the standard  protocol without IV contrast. COMPARISON:  02/13/2017 neck CT FINDINGS: Cardiovascular: Mild aortic arch and branch vessel atherosclerotic calcification. Mediastinum/Nodes: Scattered mediastinal and hilar lymph nodes are present. This includes several small infraclavicular and superior mediastinal lymph nodes, some of which have indistinct margins ; a lymph node anterior to the SVC measuring 1.1 cm in short axis on image 62/2; a subcarinal node measuring 1.1 cm on image 77/2; a right hilar lymph node measuring about 1.1 cm on image 71/2 ; a right infrahilar node measuring 1.1 cm on image 84/2; and an anterior pericardial node measuring 0.8 cm on image 94/2. Small bilateral axillary lymph nodes are present. Lungs/Pleura: Centrilobular emphysema. Mild scarring in the right upper lobe and right middle lobe. Mild lingular scarring. Upper Abdomen: Dependent density in the gallbladder likely from sludge or gallstones. Small gastrohepatic ligament lymph nodes and scattered small porta hepatis lymph nodes, not overtly pathologically enlarged. Musculoskeletal: Dorsal column stimulator with lead tips projecting from T7 down to T12. 8 mm sclerotic lesion in the T10 vertebral body, likely benign/incidental but technically nonspecific. IMPRESSION: 1. Scattered upper normal to mildly enlarged lymph nodes in the mediastinum and upper abdomen come nonspecific for reactive versus neoplastic etiology. An index right hilar lymph node measures 1.1 cm in short axis. 2. Mild atherosclerosis. 3. Sludge or gallstones in the gallbladder. Electronically Signed  By: Van Clines M.D.   On: 02/14/2017 08:32    Assessment:   Dwayne Richard is a 46 y.o. male very pleasant, with long standing mastocytosis, on gleevac admitted with about a month of enlarging relatively non tender L sided LN, with inconclusive bxp done as otpt and now R side LAN which is more tender and may be related to his recent chipped tooth.  There is quite a broad  differential in this work up but I am most concerned it may be related to his malignancy. Mycobacterial and fungal infections including MAI, rapid growing mycobacteria, histo, crypto are also possible. Toxo can cause similar as can cat scratch (bartonella) but no animal exposure.  Viral may be involved including EBV and CMV but I think this would be extreme for viral infection. Other etiology such as adult stills, kikuchi, sarcoid I think are less likely   Recommendations Check CMV, EBV PCR Fungal serology, serum crypto, urine histoplasma. Would consult oncology I think will need tissue bxp with path and samples sent for AFB, fungal and routine cx to get a diagnosis however.  Cont unasyn and can transition to augmentin when ready for DC if otpt fu is planned.   Thank you very much for allowing me to participate in the care of this patient. Please call with questions.   Cheral Marker. Ola Spurr, MD

## 2017-02-14 NOTE — Consult Note (Signed)
Dwayne Richard, Dwayne Richard [id number] [birth year] Dwayne Flock, MD  Reason for Consult: Evaluate for lymphadenopathy and positive Monospot test  HPI: The patient is a 46 year old white male who has had about a month long history of swelling of his left neck area. It has been tender somewhat and he has been followed by oncology because of other medical issues. A fine-needle aspiration was done of a left neck node but it was inconclusive. He had a three-day onset of swelling on the right side of his face after having had a recent tooth excision and is now on amoxicillin. The right sided swelling seems to have settled but he still feels like some swollen lymph nodes on his right side as well. The left side is more tender with a biopsy was but not as much with the other enlarged nodes. He had a positive Monospot test and says he is never had mono. He has 2 children at home with him that are 63 and 47  Yo who had not had any sore throats or other upper respiratory symptoms. He denies any sore throat on the inside or difficulty with swallowing. He is breathing okay through his nose as he normally does. Has not had any signs of cold or upper respiratory infections recently..  Allergies: No Known Allergies  ROS: Review of systems normal other than 12 systems except per HPI.  PMH:  Past Medical History:  Diagnosis Date  . COPD (chronic obstructive pulmonary disease) ([place])   . Depression   . Diabetes mellitus without complication ([place])   . Hypertension   . Neuropathy ([place])   . Sleep apnea   . Systemic mastocytosis    Per pt    FH:  Family History  Problem Relation Age of Onset  . Diabetes Father   . Hypertension Father   . Diabetes Paternal Grandmother     SH:  Social History   Social History  . Marital status: Married    Spouse name: N/A  . Number of children: N/A  . Years of education: N/A   Occupational History  . Not on file.   Social History Main Topics  . Smoking status: Former Smoker     Quit date: 11/22/2014  . Smokeless tobacco: Never Used     Comment: Patient smoked 24 years - 2 pks a day  . Alcohol use No  . Drug use: No  . Sexual activity: Not on file   Other Topics Concern  . Not on file   Social History Narrative  . No narrative on file    PSH:  Past Surgical History:  Procedure Laterality Date  . cataract surgery  08/06/2014  . cataract surgery  11/05/2014  . SEPTOPLASTY  12/15/2011  . Spinal implants  10/06/2011  . TYMPANOSTOMY TUBE PLACEMENT Right 11/03/2011  . X-STOP IMPLANTATION Right 07/25/2013    Physical  Exam: Obese white male in no acute distress. CN 2-12 grossly intact and symmetric. Oral cavity, lips, gums normal with no masses or lesions. His posterior pharynx shows tonsils to be 3+ bilaterally with no evidence of redness or exudate. They are cryptic appearing. There is no redness of his posterior pharynx or any signs of drainage from his nasopharynx. He has a Malampati 3 posterior pharynx. Skin warm and dry. Nasal cavity without polyps or purulence. External nose and ears without masses or lesions. Neck has massive lymph nodes in the left side that are minimally tender but more tender anterior and lower were in the recent FNA was done.  He has some enlarged lymph nodes on his right side as well. None of these are really well delineated. There is no thyromegaly noted. No skin changes in his neck.  I reviewed the CT scan of his neck and chest in detail. He has larger lymph nodes in the left side with her underneath the SCM making them difficult to find the specific borders in size. Lymph nodes on the right side are smaller but in a similar position. He has lymph nodes further down of his neck and extending into his mediastinum which often to be smaller and possibly more reactive. The CT shows some tonsilloliths that are small but likely cleaving the tonsils enlarged. Sinuses are all clear and there is no signs of specific lesions or growths to suggest  metastatic disease   A/P: The patient has enlarged nodes that have been going on now for the last month. Spot test is not specific for mono only and probably Epstein-Barr viral titers should be considered. This could be a mono-like illness was another similar virus him the generalized lymphadenopathy and low-grade fevers. He is not have the typical throat findings to go with Mono. Biopsy of one of the larger lymph nodes could be considered. If attempted, it should be of one of the larger deeper nodes that are likely at the core of this not a smaller lymph node that might be reactive. This would be a deep neck node biopsy on the left side of the lower larger lymph node behind the sternocleidomastoid. I'm interested in the template of infectious disease and if they have other options to consider.   Dwayne Richard H 02/14/2017 11:43 AM

## 2017-02-14 NOTE — Progress Notes (Signed)
Inpatient Diabetes Program Recommendations  AACE/ADA: New Consensus Statement on Inpatient Glycemic Control (2015)  Target Ranges:  Prepandial:   less than 140 mg/dL      Peak postprandial:   less than 180 mg/dL (1-2 hours)      Critically ill patients:  140 - 180 mg/dL   Review of Glycemic Control  Diabetes history: DM 2 Outpatient Diabetes medications: Lantus 90 Daily,Novolin R 80 units tid meal coverage, Amaryl 4 mg BID, Metformin 2000 mg Daily Current orders for Inpatient glycemic control: Lantus 90 units, Novolog 0-9 units tid + Novolog 0-5 units qhs  Inpatient Diabetes Program Recommendations:   Glucose 273 this am, patient is NPO and receiving Decadron 4 mg Q6 hours.  Consider decreasing Lantus dose to 55 units qhs for tonight. Consider increasing Novolog correction to 0-20 units tid.  Will follow trends while inpatient.  Thanks,  Tama Headings RN, MSN, [facility] Inpatient Diabetes Coordinator Team Pager [phone] (8a-5p)

## 2017-02-14 NOTE — Progress Notes (Signed)
[place] at [facility]                                                                                                                                                                                  Patient Demographics   Dwayne Richard, is a 46 y.o. male, DOB - January 06, 1971, KGU:[id number]  Admit date - 02/13/2017   Admitting Physician Demetrios Loll, MD  Outpatient Primary MD for the patient is [place] Pink, MD   LOS - 1  Subjective: Patient admitted with lymph node enlargement in neck swelling. He has this progressively over the past few months    Review of Systems:   CONSTITUTIONAL: No documented fever. No fatigue, weakness. No weight gain, no weight loss.  EYES: No blurry or double vision.  ENT: No tinnitus. No postnasal drip. No redness of the oropharynx. Neck swelling RESPIRATORY: No cough, no wheeze, no hemoptysis. No dyspnea.  CARDIOVASCULAR: No chest pain. No orthopnea. No palpitations. No syncope.  GASTROINTESTINAL: No nausea, no vomiting or diarrhea. No abdominal pain. No melena or hematochezia.  GENITOURINARY: No dysuria or hematuria.  ENDOCRINE: No polyuria or nocturia. No heat or cold intolerance.  HEMATOLOGY: No anemia. No bruising. No bleeding.  INTEGUMENTARY: No rashes. No lesions.  MUSCULOSKELETAL: No arthritis. No swelling. No gout.  NEUROLOGIC: No numbness, tingling, or ataxia. No seizure-type activity.  PSYCHIATRIC: No anxiety. No insomnia. No ADD.    Vitals:   Vitals:   02/13/17 2038 02/13/17 2100 02/14/17 0639 02/14/17 1253  BP: 137/79  (!) 143/74 118/66  Pulse: 97  76 76  Resp: 20  20 18   Temp: 98.2 F (36.8 C)  97.5 F (36.4 C) 98 F (36.7 C)  TempSrc: Oral  Oral Oral  SpO2: 92%  94% 94%  Weight:      Height:  6\' 3"  (1.905 m)      Wt Readings from Last 3 Encounters:  02/13/17 (!) 373 lb (169.2 kg)  09/23/16 (!) 373 lb 7.4 oz (169.4 kg)  08/23/16 (!) 374 lb 12.5 oz (170 kg)    No intake or output data in the 24  hours ending 02/14/17 1314  Physical Exam:   GENERAL: Pleasant-appearing in no apparent distress.  HEAD, EYES, EARS, NOSE AND THROAT: Atraumatic, normocephalic. Extraocular muscles are intact. Pupils equal and reactive to light. Sclerae anicteric. No conjunctival injection. No oro-pharyngeal erythema.  NECK: Supple. There is no jugular venous distention. No bruits, positive lymphadenopathy, no thyromegaly.  HEART: Regular rate and rhythm,. No murmurs, no rubs, no clicks.  LUNGS: Clear to auscultation bilaterally. No rales or rhonchi. No wheezes.  ABDOMEN: Soft, flat, nontender, nondistended. Has good bowel sounds. No hepatosplenomegaly appreciated.  EXTREMITIES: No evidence of any cyanosis, clubbing, or peripheral edema.  +2 pedal and radial pulses bilaterally.  NEUROLOGIC: The patient is alert, awake, and oriented x3 with no focal motor or sensory deficits appreciated bilaterally.  SKIN: Moist and warm with no rashes appreciated.  Psych: Not anxious, depressed LN: No inguinal LN enlargement    Antibiotics   Anti-infectives    Start     Dose/Rate Route Frequency Ordered Stop   02/13/17 1545  Ampicillin-Sulbactam (UNASYN) 3 g in sodium chloride 0.9 % 100 mL IVPB     3 g 200 mL/hr over 30 Minutes Intravenous  Once 02/13/17 1540 02/13/17 1757      Medications   Scheduled Meds: . atorvastatin  20 mg Oral QPM  . chlorhexidine  15 mL Mouth Rinse BID  . dexamethasone  4 mg Intravenous Q6H  . diphenhydrAMINE  50 mg Oral BID  . enoxaparin (LOVENOX) injection  40 mg Subcutaneous Q12H  . famotidine  20 mg Oral BID  . fluticasone  2 spray Each Nare Daily  . gabapentin  1,200 mg Oral TID  . imatinib  400 mg Oral Daily  . insulin aspart  0-5 Units Subcutaneous QHS  . insulin aspart  0-9 Units Subcutaneous TID WC  . insulin glargine  90 Units Subcutaneous QHS  . loratadine  10 mg Oral Daily  . losartan  100 mg Oral Daily  . mouth rinse  15 mL Mouth Rinse q12n4p  . Melatonin  10 mg Oral  QHS  . pantoprazole  40 mg Oral Daily  . phenazopyridine  200 mg Oral BID  . sodium chloride flush  3 mL Intravenous Q12H  . tamsulosin  0.4 mg Oral Daily  . tiZANidine  4 mg Oral BID AC & HS  . traMADol  50 mg Oral TID  . zolpidem  10 mg Oral QHS   Continuous Infusions: PRN Meds:.sodium chloride, acetaminophen **OR** acetaminophen, albuterol, bisacodyl, hydrALAZINE, HYDROcodone-acetaminophen, ondansetron **OR** ondansetron (ZOFRAN) IV, senna-docusate, sodium chloride flush   Data Review:   Micro Results No results found for this or any previous visit (from the past 240 hour(s)).  Radiology Reports Ct Soft Tissue Neck W Contrast  Result Date: 02/13/2017 CLINICAL DATA:  46 year old male with neck swelling, bilateral palpable lateral neck masses. Status post inconclusive left neck lymph node biopsy 3 weeks ago. Trouble breathing today. EXAM: CT NECK WITH CONTRAST TECHNIQUE: Multidetector CT imaging of the neck was performed using the standard protocol following the bolus administration of intravenous contrast. CONTRAST:  59mL ISOVUE-300 IOPAMIDOL (ISOVUE-300) INJECTION 61% COMPARISON:  None. FINDINGS: Pharynx and larynx: Negative larynx; the glottis is closed. Hypopharynx is within normal limits. Mildly prominent bilateral palatine tonsils, although with speckled postinflammatory calcifications such that these are likely physiologic. There is increased symmetric appearing soft tissue in the nasopharynx suspicious for adenoid hypertrophy which would be atypical in this age group (series 2, image 32). Negative parapharyngeal and retropharyngeal spaces. Salivary glands: Negative sublingual space ; incidental left sublingual gland mylohyoid boutonniere (normal variant). The submandibular glands are normal aside from regional mass effect from bilateral abnormal cervical lymph nodes, worse on the left. See below. Bilateral parotid glands are within normal limits. Thyroid: Negative; subcentimeter left  lobe hypodense nodule on series 2, image 82 which does not meet consensus criteria for ultrasound follow-up. Lymph nodes: Bulky solid-appearing left neck lymphadenopathy at the left level 2 nodal station with individual large rounded nodes measuring up to 44 mm long axis (27 mm short axis, series 2, image  38 and sagittal image 82). Mild mass effect on the posterior left submandibular gland. Elsewhere there are numerous abnormally rounded more mildly to moderately enlarged bilateral cervical lymph nodes. Right level IIa node or small nodal conglomeration measures 19 mm short axis (series 2, image 40). Bilateral level 3 nodes are rounded measuring 12-15 mm. Smaller bilateral level 4 lymph nodes also appear increased in number and measure up to 11 mm short axis. At the right level 1 B nodal station there is a 19 mm round focus of inflammation which may be an inflamed node (series 2, image 43). Nearby enlarged but noninflamed appearing 15 mm lymph node. There is associated asymmetric thickening of the right platysma and subcutaneous inflammatory stranding which tracks inferiorly toward the midline strap muscles (series 2, image 56). No cystic or necrotic nodes identified. Vascular: Major vascular structures in the neck and at the skullbase are patent. Limited intracranial: Negative. Visualized orbits: Not included. Mastoids and visualized paranasal sinuses: Mild left maxillary sinus mucous retention cyst. Otherwise clear. Skeleton: Occasional dental caries. Absent left posterior mandible dentition. Decreased bone detail in the lower cervical spine related to large body habitus. No acute or suspicious osseous lesion. Upper chest: Negative visualized lung parenchyma aside from possible mild centrilobular emphysema. Small but increased in number superior mediastinal lymph nodes. No axillary lymphadenopathy is visible. IMPRESSION: 1. Left greater than right cervical lymphadenopathy. The largest nodes are at the left level  II station measuring up to 4.4 cm long axis (2.5-3 cm short axis). Smaller but abnormally rounded lymph nodes throughout the neck elsewhere. No cystic or necrotic nodes. Increased for age but symmetric appearing soft tissue at the nasopharynx. This constellation is most suspicious for Leukemia/lymphoma. Other lymphoproliferative disorders, including HIV, could also be considered. Nasopharyngeal carcinoma with lymph node metastases is less likely. The left neck nodes would be amenable to Ultrasound-guided Needle Biopsy. 2. Superimposed nonspecific appearing inflammatory process at the right level 1B nodal station, with associated overlying cellulitis. No associated fluid collection. Electronically Signed   By: Genevie Ann M.D.   On: 02/13/2017 15:42   Ct Chest Wo Contrast  Result Date: 02/14/2017 CLINICAL DATA:  Neck adenopathy and swelling, worsening. EXAM: CT CHEST WITHOUT CONTRAST TECHNIQUE: Multidetector CT imaging of the chest was performed following the standard protocol without IV contrast. COMPARISON:  02/13/2017 neck CT FINDINGS: Cardiovascular: Mild aortic arch and branch vessel atherosclerotic calcification. Mediastinum/Nodes: Scattered mediastinal and hilar lymph nodes are present. This includes several small infraclavicular and superior mediastinal lymph nodes, some of which have indistinct margins ; a lymph node anterior to the SVC measuring 1.1 cm in short axis on image 62/2; a subcarinal node measuring 1.1 cm on image 77/2; a right hilar lymph node measuring about 1.1 cm on image 71/2 ; a right infrahilar node measuring 1.1 cm on image 84/2; and an anterior pericardial node measuring 0.8 cm on image 94/2. Small bilateral axillary lymph nodes are present. Lungs/Pleura: Centrilobular emphysema. Mild scarring in the right upper lobe and right middle lobe. Mild lingular scarring. Upper Abdomen: Dependent density in the gallbladder likely from sludge or gallstones. Small gastrohepatic ligament lymph nodes  and scattered small porta hepatis lymph nodes, not overtly pathologically enlarged. Musculoskeletal: Dorsal column stimulator with lead tips projecting from T7 down to T12. 8 mm sclerotic lesion in the T10 vertebral body, likely benign/incidental but technically nonspecific. IMPRESSION: 1. Scattered upper normal to mildly enlarged lymph nodes in the mediastinum and upper abdomen come nonspecific for reactive versus neoplastic etiology. An index  right hilar lymph node measures 1.1 cm in short axis. 2. Mild atherosclerosis. 3. Sludge or gallstones in the gallbladder. Electronically Signed   By: Van Clines M.D.   On: 02/14/2017 08:32     CBC  Recent Labs Lab 02/13/17 1430  WBC 8.7  HGB 10.5*  HCT 32.2*  PLT 281  MCV 92.6  MCH 30.1  MCHC 32.5  RDW 15.6*  LYMPHSABS 2.7  MONOABS 0.9  EOSABS 0.2  BASOSABS 0.0    Chemistries   Recent Labs Lab 02/13/17 1430  NA 137  K 4.0  CL 103  CO2 27  GLUCOSE 222*  BUN 11  CREATININE 0.81  CALCIUM 8.8*   ------------------------------------------------------------------------------------------------------------------ estimated creatinine clearance is 190.8 mL/min (by C-G formula based on SCr of 0.81 mg/dL). ------------------------------------------------------------------------------------------------------------------ No results for input(s): HGBA1C in the last 72 hours. ------------------------------------------------------------------------------------------------------------------ No results for input(s): CHOL, HDL, LDLCALC, TRIG, CHOLHDL, LDLDIRECT in the last 72 hours. ------------------------------------------------------------------------------------------------------------------ No results for input(s): TSH, T4TOTAL, T3FREE, THYROIDAB in the last 72 hours.  Invalid input(s): FREET3 ------------------------------------------------------------------------------------------------------------------  Recent Labs   02/14/17 0508  FERRITIN 117  TIBC 382  IRON 73  RETICCTPCT 3.0    Coagulation profile  Recent Labs Lab 02/13/17 1613  INR 1.07    No results for input(s): DDIMER in the last 72 hours.  Cardiac Enzymes No results for input(s): CKMB, TROPONINI, MYOGLOBIN in the last 168 hours.  Invalid input(s): CK ------------------------------------------------------------------------------------------------------------------ Invalid input(s): [place]  Patient is 46 year old presenting with neck enlargement and lymphadenopathy   1. Progressive Neck lymphadenopathy, appreciate ENT input. I discussed the case with oncology as well who recommends excisional biopsy. I spoke to surgery as well as oncology. Recommendations for whether needle guided biopsy or excisional biopsy are pending, based on discussion between these 2 specialties. I have given Dr. Susy Manor  Dr. Burt Knack number to discuess this further. Patient had a mononucleosis positive, I have ordered an EBV panel. An infectious disease consult.  currently on Decadron  2. DM2 : continue  Lantus blood sugars are elevated.  3. Hyperlipidemia unspecified continue Lipitor  4. Essential hypertension continue losartan  5. Sleep apnea continue CPAP at bedtime       Code Status Orders        Start     Ordered   02/13/17 1714  Full code  Continuous     02/13/17 1713    Code Status History    Date Active Date Inactive Code Status Order ID Comments User Context   This patient has a current code status but no historical code status.           Consults id, ent, surgery, oncology  DVT Prophylaxis  scd's  Lab Results  Component Value Date   PLT 281 02/13/2017     Time Spent in minutes  38min  Greater than 50% of time spent in care coordination and counseling patient regarding the condition and plan of care.   [name] M.D on 02/14/2017 at 1:14 PM  Between 7am to 6pm - Pager -  [phone]  After 6pm go to [email or web address] - password [email or web address] [facility]   Office  [phone]

## 2017-02-14 NOTE — Consult Note (Signed)
Surgical Consultation  02/14/2017  Dwayne Richard is an 46 y.o. male.   CC: Lymphadenopathy  HPI: This patient with extensive lymphadenopathy. I spoke to Dr. Posey Pronto of [facility] concerning open biopsy and had reviewed Dr. Johnell Comings 27-gauge needle biopsy performed.  Past Medical History:  Diagnosis Date  . COPD (chronic obstructive pulmonary disease) ([place])   . Depression   . Diabetes mellitus without complication ([place])   . Hypertension   . Neuropathy ([place])   . Sleep apnea   . Systemic mastocytosis    Per pt    Past Surgical History:  Procedure Laterality Date  . cataract surgery  08/06/2014  . cataract surgery  11/05/2014  . SEPTOPLASTY  12/15/2011  . Spinal implants  10/06/2011  . TYMPANOSTOMY TUBE PLACEMENT Right 11/03/2011  . X-STOP IMPLANTATION Right 07/25/2013    Family History  Problem Relation Age of Onset  . Diabetes Father   . Hypertension Father   . Diabetes Paternal Grandmother     Social History:  reports that he quit smoking about 2 years ago. He has never used smokeless tobacco. He reports that he does not drink alcohol or use drugs.  Allergies: No Known Allergies  Medications reviewed.   Review of Systems:   Review of Systems  Constitutional: Positive for chills. Negative for fever and weight loss.  HENT: Negative.   Eyes: Negative.   Respiratory: Negative.   Cardiovascular: Negative.   Musculoskeletal: Negative for back pain and neck pain.  Skin: Negative.      Physical Exam:  BP 118/66 (BP Location: Right Arm)   Pulse 76   Temp 98 F (36.7 C) (Oral)   Resp 18   Ht 6' 3"  (1.905 m)   Wt (!) 357 lb 12.8 oz (162.3 kg) Comment: standing scale  SpO2 94%   BMI 44.72 kg/m   Physical Exam  Constitutional: No distress.  Morbidly obese male patient in no acute distress bull neck  HENT:  Head: Normocephalic and atraumatic.  Eyes: Pupils are equal, round, and reactive to light. Right eye exhibits no discharge. Left eye exhibits no  discharge. No scleral icterus.  Neck: Normal range of motion. No tracheal deviation present.  Left lateral neck with large palpable mass of nodes  Lymphadenopathy:    He has cervical adenopathy.  Skin: He is not diaphoretic.  Vitals reviewed.     Results for orders placed or performed during the hospital encounter of 02/13/17 (from the past 48 hour(s))  CBC with Differential/Platelet     Status: Abnormal   Collection Time: 02/13/17  2:30 PM  Result Value Ref Range   WBC 8.7 3.8 - 10.6 K/uL   RBC 3.48 (L) 4.40 - 5.90 MIL/uL   Hemoglobin 10.5 (L) 13.0 - 18.0 g/dL   HCT 32.2 (L) 40.0 - 52.0 %   MCV 92.6 80.0 - 100.0 fL   MCH 30.1 26.0 - 34.0 pg   MCHC 32.5 32.0 - 36.0 g/dL   RDW 15.6 (H) 11.5 - 14.5 %   Platelets 281 150 - 440 K/uL   Neutrophils Relative % 58 %   Neutro Abs 5.0 1.4 - 6.5 K/uL   Lymphocytes Relative 30 %   Lymphs Abs 2.7 1.0 - 3.6 K/uL   Monocytes Relative 10 %   Monocytes Absolute 0.9 0.2 - 1.0 K/uL   Eosinophils Relative 2 %   Eosinophils Absolute 0.2 0 - 0.7 K/uL   Basophils Relative 0 %   Basophils Absolute 0.0 0 - 0.1 K/uL  Basic  metabolic panel     Status: Abnormal   Collection Time: 02/13/17  2:30 PM  Result Value Ref Range   Sodium 137 135 - 145 mmol/L   Potassium 4.0 3.5 - 5.1 mmol/L   Chloride 103 101 - 111 mmol/L   CO2 27 22 - 32 mmol/L   Glucose, Bld 222 (H) 65 - 99 mg/dL   BUN 11 6 - 20 mg/dL   Creatinine, Ser 0.81 0.61 - 1.24 mg/dL   Calcium 8.8 (L) 8.9 - 10.3 mg/dL   GFR calc non Af Amer >60 >60 mL/min   GFR calc Af Amer >60 >60 mL/min    Comment: (NOTE) The eGFR has been calculated using the CKD EPI equation. This calculation has not been validated in all clinical situations. eGFR's persistently <60 mL/min signify possible Chronic Kidney Disease.    Anion gap 7 5 - 15  Mononucleosis screen     Status: Abnormal   Collection Time: 02/13/17  3:46 PM  Result Value Ref Range   Mono Screen POSITIVE (A) NEGATIVE  POCT rapid strep A [facility])     Status: None   Collection Time: 02/13/17  3:56 PM  Result Value Ref Range   Streptococcus, Group A Screen (Direct) NEGATIVE NEGATIVE  Lactate dehydrogenase     Status: Abnormal   Collection Time: 02/13/17  4:13 PM  Result Value Ref Range   LDH 232 (H) 98 - 192 U/L  Uric acid     Status: None   Collection Time: 02/13/17  4:13 PM  Result Value Ref Range   Uric Acid, Serum 5.2 4.4 - 7.6 mg/dL  Protime-INR     Status: None   Collection Time: 02/13/17  4:13 PM  Result Value Ref Range   Prothrombin Time 13.9 11.4 - 15.2 seconds   INR 1.07   APTT     Status: None   Collection Time: 02/13/17  4:13 PM  Result Value Ref Range   aPTT 30 24 - 36 seconds  Glucose, capillary     Status: Abnormal   Collection Time: 02/13/17  8:40 PM  Result Value Ref Range   Glucose-Capillary 319 (H) 65 - 99 mg/dL  Reticulocytes     Status: Abnormal   Collection Time: 02/14/17  5:08 AM  Result Value Ref Range   Retic Ct Pct 3.0 0.4 - 3.1 %   RBC. 3.60 (L) 4.40 - 5.90 MIL/uL   Retic Count, Manual 108.0 19.0 - 183.0 K/uL  Ferritin     Status: None   Collection Time: 02/14/17  5:08 AM  Result Value Ref Range   Ferritin 117 24 - 336 ng/mL  Iron and TIBC     Status: None   Collection Time: 02/14/17  5:08 AM  Result Value Ref Range   Iron 73 45 - 182 ug/dL   TIBC 382 250 - 450 ug/dL   Saturation Ratios 19 17.9 - 39.5 %   UIBC 310 ug/dL  Glucose, capillary     Status: Abnormal   Collection Time: 02/14/17  7:38 AM  Result Value Ref Range   Glucose-Capillary 273 (H) 65 - 99 mg/dL  Glucose, capillary     Status: Abnormal   Collection Time: 02/14/17 11:49 AM  Result Value Ref Range   Glucose-Capillary 305 (H) 65 - 99 mg/dL   Ct Soft Tissue Neck W Contrast  Result Date: 02/13/2017 CLINICAL DATA:  46 year old male with neck swelling, bilateral palpable lateral neck masses. Status post inconclusive left neck lymph node  biopsy 3 weeks ago. Trouble breathing today. EXAM: CT NECK WITH CONTRAST  TECHNIQUE: Multidetector CT imaging of the neck was performed using the standard protocol following the bolus administration of intravenous contrast. CONTRAST:  7m ISOVUE-300 IOPAMIDOL (ISOVUE-300) INJECTION 61% COMPARISON:  None. FINDINGS: Pharynx and larynx: Negative larynx; the glottis is closed. Hypopharynx is within normal limits. Mildly prominent bilateral palatine tonsils, although with speckled postinflammatory calcifications such that these are likely physiologic. There is increased symmetric appearing soft tissue in the nasopharynx suspicious for adenoid hypertrophy which would be atypical in this age group (series 2, image 163. Negative parapharyngeal and retropharyngeal spaces. Salivary glands: Negative sublingual space ; incidental left sublingual gland mylohyoid boutonniere (normal variant). The submandibular glands are normal aside from regional mass effect from bilateral abnormal cervical lymph nodes, worse on the left. See below. Bilateral parotid glands are within normal limits. Thyroid: Negative; subcentimeter left lobe hypodense nodule on series 2, image 82 which does not meet consensus criteria for ultrasound follow-up. Lymph nodes: Bulky solid-appearing left neck lymphadenopathy at the left level 2 nodal station with individual large rounded nodes measuring up to 44 mm long axis (27 mm short axis, series 2, image 38 and sagittal image 82). Mild mass effect on the posterior left submandibular gland. Elsewhere there are numerous abnormally rounded more mildly to moderately enlarged bilateral cervical lymph nodes. Right level IIa node or small nodal conglomeration measures 19 mm short axis (series 2, image 40). Bilateral level 3 nodes are rounded measuring 12-15 mm. Smaller bilateral level 4 lymph nodes also appear increased in number and measure up to 11 mm short axis. At the right level 1 B nodal station there is a 19 mm round focus of inflammation which may be an inflamed node (series 2,  image 43). Nearby enlarged but noninflamed appearing 15 mm lymph node. There is associated asymmetric thickening of the right platysma and subcutaneous inflammatory stranding which tracks inferiorly toward the midline strap muscles (series 2, image 56). No cystic or necrotic nodes identified. Vascular: Major vascular structures in the neck and at the skullbase are patent. Limited intracranial: Negative. Visualized orbits: Not included. Mastoids and visualized paranasal sinuses: Mild left maxillary sinus mucous retention cyst. Otherwise clear. Skeleton: Occasional dental caries. Absent left posterior mandible dentition. Decreased bone detail in the lower cervical spine related to large body habitus. No acute or suspicious osseous lesion. Upper chest: Negative visualized lung parenchyma aside from possible mild centrilobular emphysema. Small but increased in number superior mediastinal lymph nodes. No axillary lymphadenopathy is visible. IMPRESSION: 1. Left greater than right cervical lymphadenopathy. The largest nodes are at the left level II station measuring up to 4.4 cm long axis (2.5-3 cm short axis). Smaller but abnormally rounded lymph nodes throughout the neck elsewhere. No cystic or necrotic nodes. Increased for age but symmetric appearing soft tissue at the nasopharynx. This constellation is most suspicious for Leukemia/lymphoma. Other lymphoproliferative disorders, including HIV, could also be considered. Nasopharyngeal carcinoma with lymph node metastases is less likely. The left neck nodes would be amenable to Ultrasound-guided Needle Biopsy. 2. Superimposed nonspecific appearing inflammatory process at the right level 1B nodal station, with associated overlying cellulitis. No associated fluid collection. Electronically Signed   By: HGenevie AnnM.D.   On: 02/13/2017 15:42   Ct Chest Wo Contrast  Result Date: 02/14/2017 CLINICAL DATA:  Neck adenopathy and swelling, worsening. EXAM: CT CHEST WITHOUT  CONTRAST TECHNIQUE: Multidetector CT imaging of the chest was performed following the standard protocol without IV contrast. COMPARISON:  02/13/2017 neck CT FINDINGS: Cardiovascular: Mild aortic arch and branch vessel atherosclerotic calcification. Mediastinum/Nodes: Scattered mediastinal and hilar lymph nodes are present. This includes several small infraclavicular and superior mediastinal lymph nodes, some of which have indistinct margins ; a lymph node anterior to the SVC measuring 1.1 cm in short axis on image 62/2; a subcarinal node measuring 1.1 cm on image 77/2; a right hilar lymph node measuring about 1.1 cm on image 71/2 ; a right infrahilar node measuring 1.1 cm on image 84/2; and an anterior pericardial node measuring 0.8 cm on image 94/2. Small bilateral axillary lymph nodes are present. Lungs/Pleura: Centrilobular emphysema. Mild scarring in the right upper lobe and right middle lobe. Mild lingular scarring. Upper Abdomen: Dependent density in the gallbladder likely from sludge or gallstones. Small gastrohepatic ligament lymph nodes and scattered small porta hepatis lymph nodes, not overtly pathologically enlarged. Musculoskeletal: Dorsal column stimulator with lead tips projecting from T7 down to T12. 8 mm sclerotic lesion in the T10 vertebral body, likely benign/incidental but technically nonspecific. IMPRESSION: 1. Scattered upper normal to mildly enlarged lymph nodes in the mediastinum and upper abdomen come nonspecific for reactive versus neoplastic etiology. An index right hilar lymph node measures 1.1 cm in short axis. 2. Mild atherosclerosis. 3. Sludge or gallstones in the gallbladder. Electronically Signed   By: Van Clines M.D.   On: 02/14/2017 08:32    Assessment/Plan:  CT scan personally reviewed. I discussed previously with Dr. Posey Pronto potential for repeating the fine-needle aspiration cytology with a larger needle of this node that has enlarged since the prior biopsy 3 weeks ago.  ENT consultation is suggested excisional biopsy and I would be in agreement with that plan as Dr. Kathyrn Sheriff has suggested. No general surgical plans at this time we'll be available if needed.  Florene Glen, MD, FACS

## 2017-02-14 NOTE — Progress Notes (Signed)
Initial Nutrition Assessment  DOCUMENTATION CODES:   Morbid obesity  INTERVENTION:  - Monitor for SLP recommendations - Boost Breeze BID, each supplement provides 250 calories and 9 grams protein - Unjury chicken soup flavor BID, each supplement provides 100 calories and 21 grams protein  NUTRITION DIAGNOSIS:   Inadequate oral intake related to dysphagia (and chewing difficulty ) as evidenced by per patient/family report.  GOAL:   Patient will meet greater than or equal to 90% of their needs  MONITOR:   PO intake, Supplement acceptance, Labs, Weight trends, I & O's  REASON FOR ASSESSMENT:   Malnutrition Screening Tool    ASSESSMENT:   46 y.o. Male with PMH of COPD, OSA, HTN, DM, and neuropathy presented with a large left lymph node which was biopsied but results were inconclusive. Pt noticed swelling on right side which has continuously worsened. Pt reports headache, fever, dysphagia, and sweating. CAT scan showed possible leukemia/lymphoma.   Spoke with pt at bedside. Pt reports a small amount of weight loss but unsure of exact number because "they did not take a weight" on admission. Pt reports a UBW of ~380 lb. RD took pt's weight and he currently weighs 357.8 lbs, this is a 16 lb weight loss since November (4.3% weight loss in 4 months - not significant for time frame)  Per chart review pt's weight seems to be stable  Wt Readings from Last 10 Encounters:  02/13/17 (!) 373 lb (169.2 kg)  09/23/16 (!) 373 lb 7.4 oz (169.4 kg)  08/23/16 (!) 374 lb 12.5 oz (170 kg)   Pt reports a decreased appetite over the past 4 days. Pt reports difficulty chewing and swallowing. Pt reports currently only consuming clear liquids. Stating a regular diet especially meats, and full liquids are not easily consumed.   Labs reviewed; CBG [phone]) Medications reviewed; Decadron, Gleevec, Pepcid, Protonix, Sliding scale insulin, Lantus, Melatonin  Nutrition-Focused physical exam completed.  Findings include no fat depletion, no muscle depletion and no edema.  Diet Order:  Diet heart healthy/carb modified Room service appropriate? Yes; Fluid consistency: Thin  Skin:  Reviewed, no issues  Last BM:  3/24  Height:   Ht Readings from Last 1 Encounters:  02/13/17 6\' 3"  (1.905 m)    Weight:   Wt Readings from Last 1 Encounters:  02/14/17 (!) 357 lb 12.8 oz (162.3 kg)    Ideal Body Weight:  89 kg  BMI:  Body mass index is 44.72 kg/m.  Estimated Nutritional Needs:   Kcal:  9373-4287  Protein:  175-185 grams  Fluid:  >/= 2 L/d  EDUCATION NEEDS:   Education needs no appropriate at this time  [employer]

## 2017-02-15 ENCOUNTER — Encounter: Payer: Self-pay | Admitting: *Deleted

## 2017-02-15 LAB — MISC LABCORP TEST (SEND OUT): LABCORP TEST CODE: 138230

## 2017-02-15 LAB — CMV DNA, QUANTITATIVE, PCR
CMV DNA Quant: NEGATIVE IU/mL
LOG10 CMV QN DNA PL: UNDETERMINED {Log_IU}/mL

## 2017-02-15 LAB — GLUCOSE, CAPILLARY
GLUCOSE-CAPILLARY: 329 mg/dL — AB (ref 65–99)
GLUCOSE-CAPILLARY: 322 mg/dL — AB (ref 65–99)

## 2017-02-15 LAB — CRYPTOCOCCUS ANTIGEN, SERUM: CRYPTOCOCCUS ANTIGEN, SERUM: NEGATIVE

## 2017-02-15 LAB — HISTOPLASMA ANTIGEN, URINE: Histoplasma Antigen, urine: <0.5 (ref <0.5)

## 2017-02-15 LAB — EPSTEIN-BARR VIRUS VCA, IGM: EBV VCA IgM: <36 U/mL (ref 0.0–35.9)

## 2017-02-15 LAB — SEDIMENTATION RATE: Sed Rate: 68 mm/hr — ABNORMAL HIGH (ref 0–15)

## 2017-02-15 MED ORDER — DEXAMETHASONE SODIUM PHOSPHATE 4 MG/ML IJ SOLN
4.0000 mg | Freq: Two times a day (BID) | INTRAMUSCULAR | Status: DC
  Administered 2017-02-15: 4 mg via INTRAVENOUS
  Filled 2017-02-15: qty 1

## 2017-02-15 MED ORDER — AMOXICILLIN-POT CLAVULANATE 875-125 MG PO TABS
1.0000 | ORAL_TABLET | Freq: Two times a day (BID) | ORAL | 0 refills | Status: AC
Start: 2017-02-15 — End: 2017-02-22

## 2017-02-15 MED ORDER — AMOXICILLIN-POT CLAVULANATE 875-125 MG PO TABS
1.0000 | ORAL_TABLET | Freq: Two times a day (BID) | ORAL | Status: DC
  Administered 2017-02-15: 10:00:00 1 via ORAL
  Filled 2017-02-15: qty 1

## 2017-02-15 NOTE — Discharge Instructions (Signed)
[facility] - [place] [facility] ° °DIET:  °Diabetic diet, cardiac diet ° °DISCHARGE CONDITION:  °Stable ° °ACTIVITY:  °Activity as tolerated ° °OXYGEN:  °Home Oxygen: No. °  °Oxygen Delivery: room air ° °DISCHARGE LOCATION:  °home  ° ° °ADDITIONAL DISCHARGE INSTRUCTION: ° ° °If you experience worsening of your admission symptoms, develop shortness of breath, life threatening emergency, suicidal or homicidal thoughts you must seek medical attention immediately by calling 911 or calling your MD immediately  if symptoms less severe. ° °You Must read complete instructions/literature along with all the possible adverse reactions/side effects for all the Medicines you take and that have been prescribed to you. Take any new Medicines after you have completely understood and accpet all the possible adverse reactions/side effects.  ° °Please note ° °You were cared for by a hospitalist during your hospital stay. If you have any questions about your discharge medications or the care you received while you were in the hospital after you are discharged, you can call the unit and asked to speak with the hospitalist on call if the hospitalist that took care of you is not available. Once you are discharged, your primary care physician will handle any further medical issues. Please note that NO REFILLS for any discharge medications will be authorized once you are discharged, as it is imperative that you return to your primary care physician (or establish a relationship with a primary care physician if you do not have one) for your aftercare needs so that they can reassess your need for medications and monitor your lab values. ° ° °

## 2017-02-15 NOTE — Progress Notes (Signed)
Inpatient Diabetes Program Recommendations  AACE/ADA: New Consensus Statement on Inpatient Glycemic Control (2015)  Target Ranges:  Prepandial:   less than 140 mg/dL      Peak postprandial:   less than 180 mg/dL (1-2 hours)      Critically ill patients:  140 - 180 mg/dL   Results for KEAHI, MCCARNEY (MRN [id number]) as of 02/15/2017 08:20  Ref. Range 02/14/2017 07:38 02/14/2017 11:49 02/14/2017 16:40 02/14/2017 19:58 02/15/2017 07:32  Glucose-Capillary Latest Ref Range: 65 - 99 mg/dL 273 (H) 305 (H) 262 (H) 309 (H) 329 (H)   Review of Glycemic Control  Diabetes history: DM2 Outpatient Diabetes medications: Lantus 90 Daily,Novolin R 80 units tid meal coverage, Amaryl 4 mg BID, Metformin 2000 mg Daily Current orders for Inpatient glycemic control: Lantus 90 units QHS, Novolog 0-9 units TID with meals, Novolog 0-5 units QHS  Inpatient Diabetes Program Recommendations: Insulin - Basal: If steroids are continued as ordered, please consider increasing Lantus to 95 units QHS. Correction (SSI): Please consider increasing Novolog to resistant correction scale. Insulin - Meal Coverage: Please consider ordering Novolog 8 units TID with meals for meal coverage if patient eats at least 50% of meals.  Thanks, Barnie Alderman, RN, MSN, CDE Diabetes Coordinator Inpatient Diabetes Program [phone] (Team Pager from 8am to 5pm)

## 2017-02-15 NOTE — Discharge Summary (Signed)
[place] at [facility], [place] y.o., DOB 02-14-1971, MRN [id number]. Admission date: 02/13/2017 Discharge Date 02/15/2017 Primary MD [place] Pink, MD Admitting Physician Demetrios Loll, MD  Admission Diagnosis  Lymphadenopathy of head and neck [R59.1]  Discharge Diagnosis   Active Problems:  Lymphadenopathy of head and neck  COPD  Depression Diabetes type 2 Essential hypertension Neuropathy Sleep apnea       Hospital Course  Dwayne Richard is a 46 y.o. male admitted with 1 month nontender  L neck swelling, now progressed to tender  R side. He has hx long standing mastocytosis and follows with oncology, is on gleevac. He reports chipping a tooth a week ago and then started to develop R sided tender LAN. Patient presented with worsening swelling and therefore he was admitted through the ED. Patient was seen in consultation by ENT, surgery, oncology As well as infectious disease specialist. It was finally decided with all the consultants that he will have excisional biopsy of the lymph node which will be this Thursday by ENT. Patient will also need cultures and AFB testing as per infectious disease. For now he is continued on Augmentin. He'll follow-up outpatient with infectious disease oncology and ENT for further evaluation and therapy.            Consults  ENT, oncology, ID  Significant Tests:  See full reports for all details     Ct Soft Tissue Neck W Contrast  Result Date: 02/13/2017 CLINICAL DATA:  46 year old male with neck swelling, bilateral palpable lateral neck masses. Status post inconclusive left neck lymph node biopsy 3 weeks ago. Trouble breathing today. EXAM: CT NECK WITH CONTRAST TECHNIQUE: Multidetector CT imaging of the neck was performed using the standard protocol following the bolus administration of intravenous contrast. CONTRAST:  78mL ISOVUE-300 IOPAMIDOL (ISOVUE-300) INJECTION 61% COMPARISON:  None. FINDINGS: Pharynx  and larynx: Negative larynx; the glottis is closed. Hypopharynx is within normal limits. Mildly prominent bilateral palatine tonsils, although with speckled postinflammatory calcifications such that these are likely physiologic. There is increased symmetric appearing soft tissue in the nasopharynx suspicious for adenoid hypertrophy which would be atypical in this age group (series 2, image 30). Negative parapharyngeal and retropharyngeal spaces. Salivary glands: Negative sublingual space ; incidental left sublingual gland mylohyoid boutonniere (normal variant). The submandibular glands are normal aside from regional mass effect from bilateral abnormal cervical lymph nodes, worse on the left. See below. Bilateral parotid glands are within normal limits. Thyroid: Negative; subcentimeter left lobe hypodense nodule on series 2, image 82 which does not meet consensus criteria for ultrasound follow-up. Lymph nodes: Bulky solid-appearing left neck lymphadenopathy at the left level 2 nodal station with individual large rounded nodes measuring up to 44 mm long axis (27 mm short axis, series 2, image 38 and sagittal image 82). Mild mass effect on the posterior left submandibular gland. Elsewhere there are numerous abnormally rounded more mildly to moderately enlarged bilateral cervical lymph nodes. Right level IIa node or small nodal conglomeration measures 19 mm short axis (series 2, image 40). Bilateral level 3 nodes are rounded measuring 12-15 mm. Smaller bilateral level 4 lymph nodes also appear increased in number and measure up to 11 mm short axis. At the right level 1 B nodal station there is a 19 mm round focus of inflammation which may be an inflamed node (series 2, image 43). Nearby enlarged but noninflamed appearing 15 mm lymph node. There is associated asymmetric thickening of the right platysma and subcutaneous  inflammatory stranding which tracks inferiorly toward the midline strap muscles (series 2, image 56).  No cystic or necrotic nodes identified. Vascular: Major vascular structures in the neck and at the skullbase are patent. Limited intracranial: Negative. Visualized orbits: Not included. Mastoids and visualized paranasal sinuses: Mild left maxillary sinus mucous retention cyst. Otherwise clear. Skeleton: Occasional dental caries. Absent left posterior mandible dentition. Decreased bone detail in the lower cervical spine related to large body habitus. No acute or suspicious osseous lesion. Upper chest: Negative visualized lung parenchyma aside from possible mild centrilobular emphysema. Small but increased in number superior mediastinal lymph nodes. No axillary lymphadenopathy is visible. IMPRESSION: 1. Left greater than right cervical lymphadenopathy. The largest nodes are at the left level II station measuring up to 4.4 cm long axis (2.5-3 cm short axis). Smaller but abnormally rounded lymph nodes throughout the neck elsewhere. No cystic or necrotic nodes. Increased for age but symmetric appearing soft tissue at the nasopharynx. This constellation is most suspicious for Leukemia/lymphoma. Other lymphoproliferative disorders, including HIV, could also be considered. Nasopharyngeal carcinoma with lymph node metastases is less likely. The left neck nodes would be amenable to Ultrasound-guided Needle Biopsy. 2. Superimposed nonspecific appearing inflammatory process at the right level 1B nodal station, with associated overlying cellulitis. No associated fluid collection. Electronically Signed   By: Genevie Ann M.D.   On: 02/13/2017 15:42   Ct Chest Wo Contrast  Result Date: 02/14/2017 CLINICAL DATA:  Neck adenopathy and swelling, worsening. EXAM: CT CHEST WITHOUT CONTRAST TECHNIQUE: Multidetector CT imaging of the chest was performed following the standard protocol without IV contrast. COMPARISON:  02/13/2017 neck CT FINDINGS: Cardiovascular: Mild aortic arch and branch vessel atherosclerotic calcification.  Mediastinum/Nodes: Scattered mediastinal and hilar lymph nodes are present. This includes several small infraclavicular and superior mediastinal lymph nodes, some of which have indistinct margins ; a lymph node anterior to the SVC measuring 1.1 cm in short axis on image 62/2; a subcarinal node measuring 1.1 cm on image 77/2; a right hilar lymph node measuring about 1.1 cm on image 71/2 ; a right infrahilar node measuring 1.1 cm on image 84/2; and an anterior pericardial node measuring 0.8 cm on image 94/2. Small bilateral axillary lymph nodes are present. Lungs/Pleura: Centrilobular emphysema. Mild scarring in the right upper lobe and right middle lobe. Mild lingular scarring. Upper Abdomen: Dependent density in the gallbladder likely from sludge or gallstones. Small gastrohepatic ligament lymph nodes and scattered small porta hepatis lymph nodes, not overtly pathologically enlarged. Musculoskeletal: Dorsal column stimulator with lead tips projecting from T7 down to T12. 8 mm sclerotic lesion in the T10 vertebral body, likely benign/incidental but technically nonspecific. IMPRESSION: 1. Scattered upper normal to mildly enlarged lymph nodes in the mediastinum and upper abdomen come nonspecific for reactive versus neoplastic etiology. An index right hilar lymph node measures 1.1 cm in short axis. 2. Mild atherosclerosis. 3. Sludge or gallstones in the gallbladder. Electronically Signed   By: Van Clines M.D.   On: 02/14/2017 08:32       Today   Subjective:   Dwayne Richard  patient feeling better still has swelling on the side of the neck  Objective:   Blood pressure (!) 144/69, pulse 85, temperature 97.7 F (36.5 C), temperature source Oral, resp. rate 16, height 6\' 3"  (1.905 m), weight (!) 357 lb 12.8 oz (162.3 kg), SpO2 96 %.  .  Intake/Output Summary (Last 24 hours) at 02/15/17 1251 Last data filed at 02/15/17 0929  Gross per 24 hour  Intake              480 ml  Output                0  ml  Net              480 ml    Exam VITAL SIGNS: Blood pressure (!) 144/69, pulse 85, temperature 97.7 F (36.5 C), temperature source Oral, resp. rate 16, height 6\' 3"  (1.905 m), weight (!) 357 lb 12.8 oz (162.3 kg), SpO2 96 %.  GENERAL:  46 y.o.-year-old patient lying in the bed with no acute distress.  EYES: Pupils equal, round, reactive to light and accommodation. No scleral icterus. Extraocular muscles intact.  HEENT: Head atraumatic, normocephalic. Oropharynx and nasopharynx clear. NECK:  Supple, no jugular venous distention. No thyroid enlargement, no tenderness. Right neck swelling LUNGS: Normal breath sounds bilaterally, no wheezing, rales,rhonchi or crepitation. No use of accessory muscles of respiration.  CARDIOVASCULAR: S1, S2 normal. No murmurs, rubs, or gallops.  ABDOMEN: Soft, nontender, nondistended. Bowel sounds present. No organomegaly or mass.  EXTREMITIES: No pedal edema, cyanosis, or clubbing.  NEUROLOGIC: Cranial nerves II through XII are intact. Muscle strength 5/5 in all extremities. Sensation intact. Gait not checked.  PSYCHIATRIC: The patient is alert and oriented x 3.  SKIN: No obvious rash, lesion, or ulcer.   Data Review     CBC w Diff: Lab Results  Component Value Date   WBC 8.7 02/13/2017   HGB 10.5 (L) 02/13/2017   HCT 32.2 (L) 02/13/2017   PLT 281 02/13/2017   LYMPHOPCT 30 02/13/2017   MONOPCT 10 02/13/2017   EOSPCT 2 02/13/2017   BASOPCT 0 02/13/2017   CMP: Lab Results  Component Value Date   NA 137 02/13/2017   K 4.0 02/13/2017   CL 103 02/13/2017   CO2 27 02/13/2017   BUN 11 02/13/2017   CREATININE 0.81 02/13/2017   PROT 7.7 09/23/2016   ALBUMIN 4.1 09/23/2016   BILITOT 0.6 09/23/2016   ALKPHOS 71 09/23/2016   AST 50 (H) 09/23/2016   ALT 54 09/23/2016  .  Micro Results No results found for this or any previous visit (from the past 240 hour(s)).      Code Status Orders        Start     Ordered   02/13/17 1714  Full code   Continuous     02/13/17 1713    Code Status History    Date Active Date Inactive Code Status Order ID Comments User Context   This patient has a current code status but no historical code status.          Follow-up Information    Lequita Asal, MD Follow up in 1 week(s).   Specialty:  Hematology and Oncology Contact information: [place] [address] [phone]        Leonel Ramsay, MD Follow up in 1 week(s).   Specialty:  Infectious Diseases Contact information: [place] [address] [phone]        ent on thursday for bx Follow up.           Discharge Medications   Allergies as of 02/15/2017   No Known Allergies     Medication List    TAKE these medications   albuterol 108 (90 Base) MCG/ACT inhaler Commonly known as:  PROVENTIL HFA;VENTOLIN HFA Inhale 2 puffs into the lungs every 6 (six) hours as needed for wheezing.  amoxicillin-clavulanate 875-125 MG tablet Commonly known as:  AUGMENTIN Take 1 tablet by mouth every 12 (twelve) hours.   aspirin 500 MG tablet Take 500 mg by mouth 2 (two) times daily.   atorvastatin 20 MG tablet Commonly known as:  LIPITOR Take 20 mg by mouth daily.   B-D INS SYR ULTRAFINE .3CC/30G 30G X 1/2" 0.3 ML Misc Generic drug:  Insulin Syringe-Needle U-100 Use 1 Syringe as directed.   Calcium Citrate 200 MG Tabs Take 1,000 mg by mouth 2 (two) times daily.   Cetirizine HCl 10 MG Caps Take 10 mg by mouth 2 (two) times daily.   cimetidine 400 MG tablet Commonly known as:  TAGAMET Take 400 mg by mouth 2 (two) times daily.   cromolyn 100 MG/5ML solution Commonly known as:  GASTROCROM Take 200 mg by mouth 4 (four) times daily -  before meals and at bedtime.   diphenhydrAMINE 50 MG capsule Commonly known as:  BENADRYL Take 50 mg by mouth 2 (two) times daily.   EPINEPHrine 0.3 mg/0.3 mL Soaj injection Commonly known as:  EPI-PEN Inject 0.3 mg into the  muscle once.   fluticasone 50 MCG/ACT nasal spray Commonly known as:  FLONASE Place 2 sprays into both nostrils daily.   gabapentin 600 MG tablet Commonly known as:  NEURONTIN Take 1,200 mg by mouth 3 (three) times daily.   glimepiride 4 MG tablet Commonly known as:  AMARYL Take 4 mg by mouth 2 (two) times daily.   GLUCAGON EMERGENCY 1 MG injection Generic drug:  glucagon Inject 1 mg into the muscle once as needed.   imatinib 400 MG tablet Commonly known as:  GLEEVEC Take 1 tablet (400 mg total) by mouth daily.   insulin regular 250 units/2.29mL (100 units/mL) injection Commonly known as:  NOVOLIN R,HUMULIN R Inject 80 Units into the skin 3 (three) times daily as needed.   Lancets 28G Misc Use 1 each 3 (three) times daily. Use as instructed.   LANTUS [place] Inject 90 Units into the skin daily.   losartan 100 MG tablet Commonly known as:  COZAAR Take 100 mg by mouth daily.   Melatonin 10 MG Tabs Take 10 mg by mouth at bedtime.   metFORMIN 1000 MG tablet Commonly known as:  GLUCOPHAGE Take 2,000 mg by mouth daily with breakfast.   multivitamin tablet Take 1 tablet by mouth daily.   omeprazole 20 MG capsule Commonly known as:  PRILOSEC Take 20 mg by mouth 2 (two) times daily before a meal.   phenazopyridine 200 MG tablet Commonly known as:  PYRIDIUM Take 200 mg by mouth 2 (two) times daily.   tamsulosin 0.4 MG Caps capsule Commonly known as:  FLOMAX Take 0.4 mg by mouth daily.   tiZANidine 4 MG capsule Commonly known as:  ZANAFLEX Take 4 mg by mouth daily.   traMADol 50 MG tablet Commonly known as:  ULTRAM Take 50 mg by mouth 3 (three) times daily.   zolpidem 12.5 MG CR tablet Commonly known as:  AMBIEN CR Take 12.5 mg by mouth at bedtime. What changed:  Another medication with the same name was removed. Continue taking this medication, and follow the directions you see here.          Total Time in preparing paper work, data evaluation and todays  exam - 35 minutes  [name] M.D on 02/15/2017 at 12:51 PM  [facility]   Office  [phone]

## 2017-02-15 NOTE — Progress Notes (Signed)
Patient discharged home per MD order. All discharge instructions given and all questions answered. 

## 2017-02-15 NOTE — Consult Note (Signed)
Antavion, Bartoszek [id number] 09-Oct-1971 [name], MD  Reason for Secondary Consult: Reevaluate to set up open biopsy of left neck node    HPI: Patient was seen yesterday for evaluation of multiple neck nodes but especially enlargement of the left neck nodes is been going on for the last month. Patient has high suspicion for possible malignancy of these nodes. I discussed this with Dr. Mike Gip and plan an open biopsy on Thursday. There is no operating room for the next 2 days here in the hospital so we will plan for him to be discharged today and do a deep left neck node biopsy. I discussed this at length with the patient and he understands the procedure and the reasons for this. He understands the potential risks and has no further questions.  Allergies: No Known Allergies  ROS: Review of systems normal other than 12 systems except per HPI.  PMH:  Past Medical History:  Diagnosis Date  . COPD (chronic obstructive pulmonary disease) ([place])   . Depression   . Diabetes mellitus without complication ([place])   . Hypertension   . Neuropathy ([place])   . Sleep apnea   . Systemic mastocytosis    Per pt    FH:  Family History  Problem Relation Age of Onset  . Diabetes Father   . Hypertension Father   . Diabetes Paternal Grandmother     SH:  Social History   Social History  . Marital status: Married    Spouse name: N/A  . Number of children: N/A  . Years of education: N/A   Occupational History  . Not on file.   Social History Main Topics  . Smoking status: Former Smoker    Quit date: 11/22/2014  . Smokeless tobacco: Never Used     Comment: Patient smoked 24 years - 2 pks a day  . Alcohol use No  . Drug use: No  . Sexual activity: Not on file   Other Topics Concern  . Not on file   Social History Narrative  . No narrative on file    PSH:  Past Surgical History:  Procedure Laterality Date  . cataract surgery  08/06/2014  . cataract surgery  11/05/2014  . SEPTOPLASTY   12/15/2011  . Spinal implants  10/06/2011  . TYMPANOSTOMY TUBE PLACEMENT Right 11/03/2011  . X-STOP IMPLANTATION Right 07/25/2013    Physical  Exam: Obese white male in no acute distress. CN 2-12 grossly intact and symmetric. He is alert and oriented. Oral cavity, lips, gums, ororpharynx normal with no masses or lesions. Skin warm and dry. Nasal cavity without polyps or purulence. External nose and ears without masses or lesions. Neck with enlarged lymph nodes on both sides and neck especially his left side. These are firm and immobile and hard to delineate. Thyroid normal with no masses. The lungs are clear to auscultation. The heart shows regular rate and rhythm without murmur. His extremities are without deformity.  I reviewed the CT scan of the neck which shows multiple lymph nodes both sides and neck especially some large ones on the left side. There is a 4 cm node high underneath the sternocleidomastoid and then beneath it is a 2-1/2-3 cm node that is adjacent to the bottom of the submandibular gland.   A/P: Patient has multiple nodes with suspicion of tumor. The 2 large nodes on the left side appear to be to primary nodes, and removing the lower node would be most productive. This will be sent for some cultures  but especially for pathology and flow cytometry to look for likely lymphoma. This surgery will be done in the surgery center on Thursday since there is time available there and no time available in the hospital operating room, and we're trying to get this done in a timely fashion. The patient understands and the surgery has been arranged.      Shantil Vallejo H 02/15/2017 7:43 AM

## 2017-02-15 NOTE — Progress Notes (Addendum)
SLP Cancellation Note  Patient Details Name: Dwayne Richard MRN: [id number] DOB: August 02, 1971   Cancelled treatment:       Reason Eval/Treat Not Completed: SLP screened, no needs identified, will sign off (chart reviewed; met w/ pt and wife in room). Noted pt's admitting dx of Lymphadenopathy of head and neck - ENT f/u on TH per chart. Discussed w/ pt and wife his feelings re: his swallowing today. Pt stated he was doing well w/ his swallowing as long as he monitors his bite size and moistens the foods - he acknowledged the need to stay away from raw, hard foods such as salads. Pt was able to state general aspiration precautions including taking small bites and sips slowly.  Pt and wife did not feel he need a BSE at this time. Agreed w/ the upgrade of diet to Regular w/ SLP f/u after meal if any difficulties noted. Post lunch meal, pt stated he did "fine" w/ the foods and liquids "taking small bites". Recommended pt f/u w/ MD if any further difficulty noted w/ swallowing as he continues medical treatments. No further skilled ST services indicated at this time. NSG to reconsult if needed.    Orinda Kenner, MS, CCC-SLP Watson,Katherine 02/15/2017, 1:55 PM

## 2017-02-16 LAB — TRYPTASE: Tryptase: 28.2 ug/L — ABNORMAL HIGH (ref 2.2–13.2)

## 2017-02-16 NOTE — Discharge Instructions (Signed)

## 2017-02-17 ENCOUNTER — Ambulatory Visit
Admission: RE | Admit: 2017-02-17 | Discharge: 2017-02-17 | Disposition: A | Discharge status: Home / Self Care | Payer: BLUE CROSS/BLUE SHIELD | Source: Ambulatory Visit | Attending: Otolaryngology | Admitting: Otolaryngology

## 2017-02-17 ENCOUNTER — Encounter
Admission: RE | Disposition: A | Discharge status: Home / Self Care | Payer: Self-pay | Source: Ambulatory Visit | Attending: Otolaryngology

## 2017-02-17 ENCOUNTER — Ambulatory Visit: Payer: BLUE CROSS/BLUE SHIELD | Admitting: Anesthesiology

## 2017-02-17 DIAGNOSIS — G4730 Sleep apnea, unspecified: Secondary | ICD-10-CM | POA: Insufficient documentation

## 2017-02-17 DIAGNOSIS — Z87891 Personal history of nicotine dependence: Secondary | ICD-10-CM | POA: Insufficient documentation

## 2017-02-17 DIAGNOSIS — J449 Chronic obstructive pulmonary disease, unspecified: Secondary | ICD-10-CM | POA: Diagnosis not present

## 2017-02-17 DIAGNOSIS — Z6841 Body Mass Index (BMI) 40.0 and over, adult: Secondary | ICD-10-CM | POA: Diagnosis not present

## 2017-02-17 DIAGNOSIS — Z7982 Long term (current) use of aspirin: Secondary | ICD-10-CM | POA: Insufficient documentation

## 2017-02-17 DIAGNOSIS — Z9842 Cataract extraction status, left eye: Secondary | ICD-10-CM | POA: Diagnosis not present

## 2017-02-17 DIAGNOSIS — Z79899 Other long term (current) drug therapy: Secondary | ICD-10-CM | POA: Insufficient documentation

## 2017-02-17 DIAGNOSIS — C01 Malignant neoplasm of base of tongue: Secondary | ICD-10-CM | POA: Insufficient documentation

## 2017-02-17 DIAGNOSIS — R590 Localized enlarged lymph nodes: Secondary | ICD-10-CM | POA: Diagnosis present

## 2017-02-17 DIAGNOSIS — Z8249 Family history of ischemic heart disease and other diseases of the circulatory system: Secondary | ICD-10-CM | POA: Insufficient documentation

## 2017-02-17 DIAGNOSIS — K219 Gastro-esophageal reflux disease without esophagitis: Secondary | ICD-10-CM | POA: Insufficient documentation

## 2017-02-17 DIAGNOSIS — Z7951 Long term (current) use of inhaled steroids: Secondary | ICD-10-CM | POA: Insufficient documentation

## 2017-02-17 DIAGNOSIS — E119 Type 2 diabetes mellitus without complications: Secondary | ICD-10-CM | POA: Diagnosis not present

## 2017-02-17 DIAGNOSIS — Z794 Long term (current) use of insulin: Secondary | ICD-10-CM | POA: Diagnosis not present

## 2017-02-17 DIAGNOSIS — I10 Essential (primary) hypertension: Secondary | ICD-10-CM | POA: Insufficient documentation

## 2017-02-17 DIAGNOSIS — D4702 Systemic mastocytosis: Secondary | ICD-10-CM | POA: Diagnosis not present

## 2017-02-17 DIAGNOSIS — Z833 Family history of diabetes mellitus: Secondary | ICD-10-CM | POA: Diagnosis not present

## 2017-02-17 DIAGNOSIS — C770 Secondary and unspecified malignant neoplasm of lymph nodes of head, face and neck: Secondary | ICD-10-CM | POA: Diagnosis not present

## 2017-02-17 DIAGNOSIS — Z9841 Cataract extraction status, right eye: Secondary | ICD-10-CM | POA: Diagnosis not present

## 2017-02-17 DIAGNOSIS — Z9889 Other specified postprocedural states: Secondary | ICD-10-CM | POA: Insufficient documentation

## 2017-02-17 HISTORY — PX: MASS BIOPSY: SHX5445

## 2017-02-17 HISTORY — DX: Gastro-esophageal reflux disease without esophagitis: K21.9

## 2017-02-17 HISTORY — DX: Other specified postprocedural states: Z98.890

## 2017-02-17 HISTORY — DX: Presence of other specified functional implants: Z96.89

## 2017-02-17 LAB — GLUCOSE, CAPILLARY
GLUCOSE-CAPILLARY: 191 mg/dL — AB (ref 65–99)
Glucose-Capillary: 168 mg/dL — ABNORMAL HIGH (ref 65–99)

## 2017-02-17 LAB — FUNGAL ANTIBODIES PANEL, ID-BLOOD
ASPERGILLUS FLAVUS: NEGATIVE
ASPERGILLUS FUMIGATUS IGG: NEGATIVE
ASPERGILLUS NIGER: NEGATIVE
BLASTOMYCES ABS, QN, DID: NEGATIVE
Histoplasma Ab, Immunodiffusion: NEGATIVE

## 2017-02-17 SURGERY — BIOPSY, MASS, NECK
Anesthesia: General | Site: Neck | Laterality: Left | Wound class: Clean

## 2017-02-17 MED ORDER — DEXAMETHASONE SODIUM PHOSPHATE 4 MG/ML IJ SOLN
INTRAMUSCULAR | Status: DC | PRN
  Administered 2017-02-17: 8 mg via INTRAVENOUS

## 2017-02-17 MED ORDER — ACETAMINOPHEN 325 MG PO TABS: 325.0000 mg | ORAL_TABLET | ORAL | Status: DC | PRN

## 2017-02-17 MED ORDER — FENTANYL CITRATE (PF) 100 MCG/2ML IJ SOLN
INTRAMUSCULAR | Status: DC | PRN
  Administered 2017-02-17: 100 ug via INTRAVENOUS

## 2017-02-17 MED ORDER — OXYCODONE HCL 5 MG/5ML PO SOLN: 5.0000 mg | Freq: Once | ORAL | Status: AC | PRN

## 2017-02-17 MED ORDER — OXYCODONE HCL 5 MG PO TABS
5.0000 mg | ORAL_TABLET | Freq: Once | ORAL | Status: AC | PRN
  Administered 2017-02-17: 5 mg via ORAL

## 2017-02-17 MED ORDER — ONDANSETRON HCL 4 MG/2ML IJ SOLN: 4.0000 mg | Freq: Once | INTRAMUSCULAR | Status: DC | PRN

## 2017-02-17 MED ORDER — MIDAZOLAM HCL 5 MG/5ML IJ SOLN
INTRAMUSCULAR | Status: DC | PRN
  Administered 2017-02-17: 2 mg via INTRAVENOUS

## 2017-02-17 MED ORDER — BACITRACIN-NEOMYCIN-POLYMYXIN OINTMENT TUBE
TOPICAL_OINTMENT | CUTANEOUS | Status: DC | PRN
  Administered 2017-02-17: 1 via TOPICAL

## 2017-02-17 MED ORDER — LIDOCAINE HCL (CARDIAC) 20 MG/ML IV SOLN
INTRAVENOUS | Status: DC | PRN
  Administered 2017-02-17: 50 mg via INTRAVENOUS

## 2017-02-17 MED ORDER — BUPIVACAINE-EPINEPHRINE (PF) 0.5% -1:200000 IJ SOLN
INTRAMUSCULAR | Status: DC | PRN
  Administered 2017-02-17: 5 mL

## 2017-02-17 MED ORDER — ONDANSETRON HCL 4 MG/2ML IJ SOLN
INTRAMUSCULAR | Status: DC | PRN
  Administered 2017-02-17: 4 mg via INTRAVENOUS

## 2017-02-17 MED ORDER — FENTANYL CITRATE (PF) 100 MCG/2ML IJ SOLN: 25.0000 ug | INTRAMUSCULAR | Status: DC | PRN

## 2017-02-17 MED ORDER — SUCCINYLCHOLINE CHLORIDE 20 MG/ML IJ SOLN
INTRAMUSCULAR | Status: DC | PRN
  Administered 2017-02-17: 1000 mg via INTRAVENOUS

## 2017-02-17 MED ORDER — LACTATED RINGERS IV SOLN
INTRAVENOUS | Status: DC
  Administered 2017-02-17: 07:00:00 via INTRAVENOUS

## 2017-02-17 MED ORDER — ACETAMINOPHEN 160 MG/5ML PO SOLN: 325.0000 mg | ORAL | Status: DC | PRN

## 2017-02-17 MED ORDER — PROPOFOL 10 MG/ML IV BOLUS
INTRAVENOUS | Status: DC | PRN
  Administered 2017-02-17: 50 mg via INTRAVENOUS
  Administered 2017-02-17: 200 mg via INTRAVENOUS

## 2017-02-17 MED ORDER — GLYCOPYRROLATE 0.2 MG/ML IJ SOLN
INTRAMUSCULAR | Status: DC | PRN
  Administered 2017-02-17: 0.2 mg via INTRAVENOUS

## 2017-02-17 SURGICAL SUPPLY — 32 items
BLADE SURG 15 STRL LF DISP TIS (BLADE) IMPLANT
BLADE SURG 15 STRL SS (BLADE)
CORD BIP STRL DISP 12FT (MISCELLANEOUS) IMPLANT
DRAIN TLS ROUND 10FR (DRAIN) ×2 IMPLANT
DRAPE HEAD BAR (DRAPES) ×2 IMPLANT
DRESSING TELFA 4X3 1S ST N-ADH (GAUZE/BANDAGES/DRESSINGS) ×6 IMPLANT
ELECT CAUTERY BLADE TIP 2.5 (TIP)
ELECT CAUTERY NEEDLE 2.0 MIC (NEEDLE) ×2 IMPLANT
ELECTRODE CAUTERY BLDE TIP 2.5 (TIP) IMPLANT
GAUZE SPONGE 4X4 12PLY STRL (GAUZE/BANDAGES/DRESSINGS) IMPLANT
GLOVE PI ULTRA LF STRL 7.5 (GLOVE) ×2 IMPLANT
GLOVE PI ULTRA NON LATEX 7.5 (GLOVE) ×2
HARMONIC SCALPEL FOCUS (MISCELLANEOUS) ×2 IMPLANT
KIT ROOM TURNOVER OR (KITS) ×2 IMPLANT
LIQUID BAND (GAUZE/BANDAGES/DRESSINGS) IMPLANT
NEEDLE HYPO 25GX1X1/2 BEV (NEEDLE) ×2 IMPLANT
NS IRRIG 500ML POUR BTL (IV SOLUTION) ×2 IMPLANT
PACK DRAPE NASAL/ENT (PACKS) ×2 IMPLANT
PAD GROUND ADULT SPLIT (MISCELLANEOUS) ×2 IMPLANT
PENCIL ELECTRO HAND CTR (MISCELLANEOUS) ×2 IMPLANT
SOL PREP PVP 2OZ (MISCELLANEOUS) ×2
SOLUTION PREP PVP 2OZ (MISCELLANEOUS) ×1 IMPLANT
STRAP BODY AND KNEE 60X3 (MISCELLANEOUS) ×2 IMPLANT
SUCTION FRAZIER TIP 10 FR DISP (SUCTIONS) IMPLANT
SUT PROLENE 5 0 P 3 (SUTURE) ×2 IMPLANT
SUT SILK 2 0 (SUTURE) ×1
SUT SILK 2-0 18XBRD TIE 12 (SUTURE) ×1 IMPLANT
SUT SILK 3 0 (SUTURE) ×1
SUT SILK 3-0 18XBRD TIE 12 (SUTURE) ×1 IMPLANT
SUT VIC AB 4-0 RB1 27 (SUTURE) ×1
SUT VIC AB 4-0 RB1 27X BRD (SUTURE) ×1 IMPLANT
SYRINGE 10CC LL (SYRINGE) ×2 IMPLANT

## 2017-02-17 NOTE — Transfer of Care (Signed)
Immediate Anesthesia Transfer of Care Note  Patient: Dwayne Richard  Procedure(s) Performed: Procedure(s) with comments: NECK MASS BIOPSY (Left) - NEED HARMONIC SCAPLE diabetic - insulin and oral meds sleep apnea  Patient Location: PACU  Anesthesia Type: General ETT  Level of Consciousness: awake, alert  and patient cooperative  Airway and Oxygen Therapy: Patient Spontanous Breathing and Patient connected to supplemental oxygen  Post-op Assessment: Post-op Vital signs reviewed, Patient's Cardiovascular Status Stable, Respiratory Function Stable, Patent Airway and No signs of Nausea or vomiting  Post-op Vital Signs: Reviewed and stable  Complications: No apparent anesthesia complications

## 2017-02-17 NOTE — Op Note (Signed)
02/17/2017  9:39 AM    Bishop [place]  [id number]    Pre-Op Dx:  Large deep left neck nodes with suspicion of cancer  Post-op Dx: Same  Proc: Excision left deep neck node   Surg:  Tyriek Hofman H  Anes:  GOT  EBL:  20 mL  Comp:  None  Findings:  Very firm hard matted nodes underneath the SCM and deep to the left submandibular gland. The deep margin was the jugular vein.  Procedure: The patient was given general anesthesia by oral endotracheal intubation. His head was turned to the right and his left neck was visualized. His angle the jaw was marked for a landmark and the SCM was marked as well. A horizontal skin incision was marked at about mid neck overlying the left her neck masses. 1/4% Marcaine with epi 100,000 was used for infiltration the skin and underlying tissues. He was prepped and draped in a sterile fashion.  Skin incision was created through the skin and subcutaneous and the fatty tissue here. The platysma was then cut and there is another layer of fatty tissue underneath that. These were divided and self-retaining retractors were used to help hold the wound open. The SCM was encountered and this was freed up along its anterior border and retracted posteriorly. See large firm mass that was well encapsulated. The mass was freed up superficially of the fatty tissues so I could see this better. It had a smaller node in its anterior border and a bigger node up underneath the sternocleidomastoid as posterior border. The superior edge was freed up and soft tissue was aerated from the capsule. There was a dense fibrous capsule around it as well that was freed up. The vessels were cut with the Harmonic to free the entire superior edge. The inferior edge was separated from the sternocleidomastoid muscle posteriorly. The anterior attachments around the anterior node that was stuck to the larger node were freed up so I could get around underneath the mass somewhat on the anterior  border. It was densely adherent posteriorly. As able to finally find a Anes between the 2 large lymph nodes separate and leave the largest lymph node underneath the sternocleidomastoid. Separated the posterior attachments and around the underneath the posterior attachments was the jugular vein. As able to separate this from the inferior capsule and leave it intact. The posterior inferior area there was another small nodule that was pushing down into the muscle. This was freed up and entered removed as well. That then freed up all the attachments to the node. Main node was 3 cm in size and slightly oblong. There was another one half centimeter node in its anterior border and a 1 cm lump that was deep into the muscle posteriorly. The larger node that's 4 cm by x-ray underneath sternocleidomastoid was not removed.  The wound was irrigated and there was minimal signs of any ooze. It is felt best to put a drain in here so a 10 [place] TLS drain was placed through an anterior stab wound. Soft tissues collapsed on the wound. The platysma layer was closed with 40 Vicryls. The subcutaneous layer was then closed with 40 Vicryls. The skin edges were closed with a 5-0 nylon in a running locking suture. Wound was covered with triple anabolic ointment lightly and a Telfa was placed. The wound was then covered with Tegaderm. The drain was taped into place and put to low continuous Vacutainer suction.  The patient tolerated the procedure well. He was awakened  and taken to the recovery room in satisfactory condition. Were no operative complications. Total estimated blood loss was 20 mL.  PlanTo rest at home. We'll see him tomorrow morning to remove his drain. He uses CPAP at home and will the small amount of pain medication use as needed. Await college he. Specimen sent fresh to evaluate for lymphoma. Cultures were also taken for fungus and AFB and aerobic and anaerobic bacteria.  Huey Romans  02/17/2017 9:39  AM  Copies sent to Dr. Mike Gip, Dr. Ola Spurr,

## 2017-02-17 NOTE — H&P (Signed)
H&P has been reviewed and pt was reexamined, and no changes necessary. To be downloaded later.

## 2017-02-17 NOTE — Anesthesia Procedure Notes (Signed)
Procedure Name: Intubation Performed by: Londell Moh Pre-anesthesia Checklist: Patient identified, Emergency Drugs available, Suction available, Patient being monitored and Timeout performed Patient Re-evaluated:Patient Re-evaluated prior to inductionOxygen Delivery Method: Circle system utilized Preoxygenation: Pre-oxygenation with 100% oxygen Intubation Type: IV induction Ventilation: Mask ventilation without difficulty and Oral airway inserted - appropriate to patient size Laryngoscope Size: Mac Grade View: Grade IV Tube size: 7.5 mm Number of attempts: 1 Airway Equipment and Method: Patient positioned with wedge pillow,  Video-laryngoscopy and Rigid stylet Placement Confirmation: ETT inserted through vocal cords under direct vision,  positive ETCO2 and breath sounds checked- equal and bilateral Tube secured with: Tape Dental Injury: Teeth and Oropharynx as per pre-operative assessment  Difficulty Due To: Difficulty was anticipated, Difficult Airway-  due to edematous airway, Difficult Airway- due to reduced neck mobility and Difficult Airway- due to large tongue Future Recommendations: Recommend- induction with short-acting agent, and alternative techniques readily available Comments: Lymph node near aryepiglottic fold on the left obstructing the view to cords. Intubated using glidescope and no injury to oral mucosa.

## 2017-02-17 NOTE — Anesthesia Preprocedure Evaluation (Addendum)
Anesthesia Evaluation  Patient identified by MRN, date of birth, ID band Patient awake    Reviewed: Allergy & Precautions, H&P , NPO status , Patient's Chart, lab work & pertinent test results  Airway Mallampati: III  TM Distance: >3 FB Neck ROM: full    Dental  (+) Chipped   Pulmonary sleep apnea , COPD, former smoker,    Pulmonary exam normal        Cardiovascular hypertension, Normal cardiovascular exam     Neuro/Psych    GI/Hepatic GERD  ,  Endo/Other  diabetesMorbid obesity  Renal/GU      Musculoskeletal   Abdominal   Peds  Hematology   Anesthesia Other Findings   Reproductive/Obstetrics                            Anesthesia Physical Anesthesia Plan  ASA: III  Anesthesia Plan: General ETT   Post-op Pain Management:    Induction:   Airway Management Planned:   Additional Equipment:   Intra-op Plan:   Post-operative Plan:   Informed Consent: I have reviewed the patients History and Physical, chart, labs and discussed the procedure including the risks, benefits and alternatives for the proposed anesthesia with the patient or authorized representative who has indicated his/her understanding and acceptance.     Plan Discussed with:   Anesthesia Plan Comments:         Anesthesia Quick Evaluation

## 2017-02-17 NOTE — Anesthesia Postprocedure Evaluation (Signed)
Anesthesia Post Note  Patient: Dajion Bickford  Procedure(s) Performed: Procedure(s) (LRB): NECK MASS BIOPSY (Left)  Patient location during evaluation: PACU Anesthesia Type: General Level of consciousness: awake and alert and oriented Pain management: satisfactory to patient Vital Signs Assessment: post-procedure vital signs reviewed and stable Respiratory status: spontaneous breathing, nonlabored ventilation and respiratory function stable Cardiovascular status: blood pressure returned to baseline and stable Postop Assessment: Adequate PO intake and No signs of nausea or vomiting Anesthetic complications: no    Raliegh Ip

## 2017-02-21 ENCOUNTER — Telehealth: Payer: Self-pay | Admitting: *Deleted

## 2017-02-21 ENCOUNTER — Other Ambulatory Visit: Payer: Self-pay | Admitting: *Deleted

## 2017-02-21 ENCOUNTER — Encounter: Payer: Self-pay | Admitting: Oncology

## 2017-02-21 ENCOUNTER — Inpatient Hospital Stay: Payer: BLUE CROSS/BLUE SHIELD | Attending: Oncology | Admitting: Oncology

## 2017-02-21 VITALS — BP 133/77 | HR 102 | Temp 99.1°F | Resp 1 | Wt 363.8 lb

## 2017-02-21 DIAGNOSIS — C119 Malignant neoplasm of nasopharynx, unspecified: Secondary | ICD-10-CM | POA: Diagnosis not present

## 2017-02-21 DIAGNOSIS — J449 Chronic obstructive pulmonary disease, unspecified: Secondary | ICD-10-CM | POA: Diagnosis not present

## 2017-02-21 DIAGNOSIS — H9202 Otalgia, left ear: Secondary | ICD-10-CM | POA: Diagnosis not present

## 2017-02-21 DIAGNOSIS — I2510 Atherosclerotic heart disease of native coronary artery without angina pectoris: Secondary | ICD-10-CM

## 2017-02-21 DIAGNOSIS — D649 Anemia, unspecified: Secondary | ICD-10-CM | POA: Insufficient documentation

## 2017-02-21 DIAGNOSIS — Z79899 Other long term (current) drug therapy: Secondary | ICD-10-CM | POA: Diagnosis not present

## 2017-02-21 DIAGNOSIS — Z87891 Personal history of nicotine dependence: Secondary | ICD-10-CM | POA: Diagnosis not present

## 2017-02-21 DIAGNOSIS — I10 Essential (primary) hypertension: Secondary | ICD-10-CM | POA: Diagnosis not present

## 2017-02-21 DIAGNOSIS — C01 Malignant neoplasm of base of tongue: Secondary | ICD-10-CM | POA: Diagnosis present

## 2017-02-21 DIAGNOSIS — K5900 Constipation, unspecified: Secondary | ICD-10-CM | POA: Insufficient documentation

## 2017-02-21 DIAGNOSIS — J029 Acute pharyngitis, unspecified: Secondary | ICD-10-CM | POA: Diagnosis not present

## 2017-02-21 DIAGNOSIS — F329 Major depressive disorder, single episode, unspecified: Secondary | ICD-10-CM | POA: Diagnosis not present

## 2017-02-21 DIAGNOSIS — D4702 Systemic mastocytosis: Secondary | ICD-10-CM | POA: Diagnosis not present

## 2017-02-21 DIAGNOSIS — G629 Polyneuropathy, unspecified: Secondary | ICD-10-CM | POA: Insufficient documentation

## 2017-02-21 DIAGNOSIS — E119 Type 2 diabetes mellitus without complications: Secondary | ICD-10-CM | POA: Diagnosis not present

## 2017-02-21 DIAGNOSIS — R1310 Dysphagia, unspecified: Secondary | ICD-10-CM | POA: Diagnosis not present

## 2017-02-21 DIAGNOSIS — Z794 Long term (current) use of insulin: Secondary | ICD-10-CM

## 2017-02-21 DIAGNOSIS — K8020 Calculus of gallbladder without cholecystitis without obstruction: Secondary | ICD-10-CM | POA: Diagnosis not present

## 2017-02-21 DIAGNOSIS — G4730 Sleep apnea, unspecified: Secondary | ICD-10-CM | POA: Insufficient documentation

## 2017-02-21 DIAGNOSIS — R591 Generalized enlarged lymph nodes: Secondary | ICD-10-CM

## 2017-02-21 DIAGNOSIS — Z5111 Encounter for antineoplastic chemotherapy: Secondary | ICD-10-CM | POA: Diagnosis not present

## 2017-02-21 DIAGNOSIS — K219 Gastro-esophageal reflux disease without esophagitis: Secondary | ICD-10-CM | POA: Insufficient documentation

## 2017-02-21 DIAGNOSIS — Z7982 Long term (current) use of aspirin: Secondary | ICD-10-CM

## 2017-02-21 NOTE — Telephone Encounter (Signed)
Called patient and informed him of his upcoming appts for chemo class, dr. Baruch Gouty, Dr. Mike Gip and chemotherapy and PET scan, voiced understanding.

## 2017-02-21 NOTE — Progress Notes (Signed)
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 Patient also has a h/o systemic mastocytosis  Chief complaint/ Reason for visit- discuss results of biopsy  Heme/Onc history:  The patient has been followed in the oncology clinic since 08/23/2016 since 08/2016.  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  His disease has been controlled with cetirizine (Zyrtec), cimetidine (Tagamet), cromolyn (Gastrocrom), and Gleevec (400 mg a day). He uses an epinephrine pen 3-4 times a year.  He was scheduled for follow-up every 3 months.  He is at risk for disease transformation including acute hematologic neoplasms (myeloid leukemia, lymphoma).  He did not make his appointment on 12/23/2016.  He has been followed by Dr Josue Hector in the endocrinology clinic.  He was seen on 01/07/2017.  At that time, he noted neck swelling and discomfort x 3 weeks.  He denied any recent cough, cold or flu-like symptoms. Ultrasound of the neck on 12/31/2016 revealed 2 left sided cystic nodules (1-1.78 cm) and 2 adjacent masses measuring 3.46 cm and 3.66 cm.  On 01/13/2017, he underwent ultrasound guided fine needle aspirate of a left neck node by Dr. Gabriel Carina.  Pathology revealed a monotonous lymphoid population.  The patient presented to [facility] ER on 02/13/2017 with headache, low grade fever, sweating, neck swelling, and shortness of breath.    Neck CT on 02/13/2017 revealed bulky (left > right) cervical lymphadenopathy. The largest nodes were at the left level II station measuring up to 4.4 cm long axis (2.5-3 cm short axis).  There were smaller but abnormally rounded lymph nodes  throughout the neck elsewhere.  There were no cystic or necrotic nodes.  There was increased for age but symmetric appearing soft tissue at the nasopharynx.  There was superimposed nonspecific appearing inflammatory process at the right level 1B nodal station, with associated overlying cellulitis.  There was no associated fluid collection.  CT thorax on 02/14/17 showed: 1. Scattered upper normal to mildly enlarged lymph nodes in the mediastinum and upper abdomen come nonspecific for reactive versus neoplastic etiology. An index right hilar lymph node measures 1.1 cm in short axis. 2. Mild atherosclerosis. 3. Sludge or gallstones in the gallbladder.   Labs revealed a hematocrit of 32.2, hemoglobin 10.5, MCV 92.6, platelets 281,000, WBC 8700 with an ANC of 5000.  Differential was unremarkable.  Creatinine was 0.81.  Uric acid was 5.2 (normal).  LDH was 232 (slightly elevated).  Monospot was positive.  He was started on Unasyn and Decadron (4 mg po q 6 hours).  Symptomatically, he notes drenching sweats for the past 1 1/2 months.  He gets up nightly to change his clothes.  He notes low grade fevers.  He has lost 23 pounds since 11/2016.   Patient was seen by ENT Dr. Kathyrn Sheriff and underwent excisional cervical LN biopsy on 02/17/17. Pathology showed: DIAGNOSIS:  A. LYMPH NODE, LEFT NECK; EXCISION:  - METASTATIC SQUAMOUS CELL CARCINOMA, P16 POSITIVE.   Comment:  IHC for p16 demonstrates diffuse strong nuclear and cytoplasmic staining  of tumor cells.   If an oropharyngeal primary is identified, these findings are consistent  with p16 positive oropharyngeal carcinoma. Depending on the clinical  suspicion additional testing for high risk HPV may  be helpful.    Interval history- Patient continues to have on and off night sweats. Appetite has been fair. Feels that his neck nodes are increasing in size.   ECOG PS- 0 Pain scale- 6- back pain and pain in his neck Opioid associated constipation-  no  Review of systems- Review of Systems  Constitutional: Positive for weight loss. Negative for chills, fever and malaise/fatigue.       Night sweats +  HENT: Negative for congestion, ear discharge and nosebleeds.        Swollen LN  Eyes: Negative for blurred vision.  Respiratory: Negative for cough, hemoptysis, sputum production, shortness of breath and wheezing.   Cardiovascular: Negative for chest pain, palpitations, orthopnea and claudication.  Gastrointestinal: Negative for abdominal pain, blood in stool, constipation, diarrhea, heartburn, melena, nausea and vomiting.  Genitourinary: Negative for dysuria, flank pain, frequency, hematuria and urgency.  Musculoskeletal: Positive for back pain. Negative for joint pain and myalgias.  Skin: Negative for rash.  Neurological: Positive for tingling (baseline neuropathy in his LE). Negative for dizziness, focal weakness, seizures, weakness and headaches.  Endo/Heme/Allergies: Does not bruise/bleed easily.  Psychiatric/Behavioral: Negative for depression and suicidal ideas. The patient does not have insomnia.      Current treatment- yet to start  No Known Allergies   Past Medical History:  Diagnosis Date  . COPD (chronic obstructive pulmonary disease) ([place])   . Depression   . Diabetes mellitus without complication ([place])   . GERD (gastroesophageal reflux disease)   . Hypertension   . Neuropathy ([facility])    from knees down, "tingling" in hands  . S/P insertion of spinal cord stimulator    (x2)  . Sleep apnea   . Systemic mastocytosis    Per pt     Past Surgical History:  Procedure Laterality Date  . cataract surgery  08/06/2014  . cataract surgery  11/05/2014  . MASS BIOPSY Left 02/17/2017   Procedure: NECK MASS BIOPSY;  Surgeon: Margaretha Sheffield, MD;  Location: [place];  Service: ENT;  Laterality: Left;  NEED HARMONIC SCAPLE diabetic - insulin and oral meds sleep apnea  . SEPTOPLASTY  12/15/2011  . Spinal implants   10/06/2011  . TYMPANOSTOMY TUBE PLACEMENT Right 11/03/2011  . X-STOP IMPLANTATION Right 07/25/2013    Social History   Social History  . Marital status: Married    Spouse name: N/A  . Number of children: N/A  . Years of education: N/A   Occupational History  . Not on file.   Social History Main Topics  . Smoking status: Former Smoker    Quit date: 11/22/2014  . Smokeless tobacco: Never Used     Comment: Patient smoked 24 years - 2 pks a day  . Alcohol use No  . Drug use: No  . Sexual activity: Not on file   Other Topics Concern  . Not on file   Social History Narrative  . No narrative on file    Family History  Problem Relation Age of Onset  . Diabetes Father   . Hypertension Father   . Diabetes Paternal Grandmother      Current Outpatient Prescriptions:  .  albuterol (PROVENTIL HFA;VENTOLIN HFA) 108 (90 Base) MCG/ACT inhaler, Inhale 2 puffs into the lungs every 6 (six) hours as needed for wheezing. , Disp: , Rfl:  .  amoxicillin-clavulanate (AUGMENTIN) 875-125 MG tablet, Take 1 tablet by mouth every 12 (twelve) hours., Disp: 14 tablet, Rfl: 0 .  aspirin 500 MG tablet, Take 500  mg by mouth 2 (two) times daily., Disp: , Rfl:  .  atorvastatin (LIPITOR) 20 MG tablet, Take 20 mg by mouth daily. , Disp: , Rfl:  .  Calcium Citrate 200 MG TABS, Take 1,000 mg by mouth 2 (two) times daily. , Disp: , Rfl:  .  Cetirizine HCl 10 MG CAPS, Take 10 mg by mouth 2 (two) times daily. , Disp: , Rfl:  .  cimetidine (TAGAMET) 400 MG tablet, Take 400 mg by mouth 2 (two) times daily. , Disp: , Rfl:  .  cromolyn (GASTROCROM) 100 MG/5ML solution, Take 200 mg by mouth 4 (four) times daily -  before meals and at bedtime. , Disp: , Rfl:  .  diphenhydrAMINE (BENADRYL) 50 MG capsule, Take 50 mg by mouth 2 (two) times daily. , Disp: , Rfl:  .  EPINEPHrine 0.3 mg/0.3 mL IJ SOAJ injection, Inject 0.3 mg into the muscle once. , Disp: , Rfl:  .  fluticasone (FLONASE) 50 MCG/ACT nasal spray, Place 2  sprays into both nostrils daily. , Disp: , Rfl:  .  gabapentin (NEURONTIN) 600 MG tablet, Take 1,200 mg by mouth 3 (three) times daily. , Disp: , Rfl:  .  glimepiride (AMARYL) 4 MG tablet, Take 4 mg by mouth 2 (two) times daily. , Disp: , Rfl:  .  glucagon (GLUCAGON EMERGENCY) 1 MG injection, Inject 1 mg into the muscle once as needed. , Disp: , Rfl:  .  imatinib (GLEEVEC) 400 MG tablet, Take 1 tablet (400 mg total) by mouth daily., Disp: 30 tablet, Rfl: 0 .  Insulin Glargine (LANTUS Sunflower), Inject 90 Units into the skin daily. , Disp: , Rfl:  .  insulin regular (NOVOLIN R,HUMULIN R) 100 units/mL injection, Inject 80 Units into the skin 3 (three) times daily as needed. , Disp: , Rfl:  .  Insulin Syringe-Needle U-100 (B-D INS SYR ULTRAFINE .3CC/30G) 30G X 1/2" 0.3 ML MISC, Use 1 Syringe as directed., Disp: , Rfl:  .  Lancets 28G MISC, Use 1 each 3 (three) times daily. Use as instructed., Disp: , Rfl:  .  losartan (COZAAR) 100 MG tablet, Take 100 mg by mouth daily. , Disp: , Rfl:  .  Melatonin 10 MG TABS, Take 10 mg by mouth at bedtime. , Disp: , Rfl:  .  metFORMIN (GLUCOPHAGE) 1000 MG tablet, Take 2,000 mg by mouth daily with breakfast. , Disp: , Rfl:  .  Multiple Vitamin (MULTIVITAMIN) tablet, Take 1 tablet by mouth daily., Disp: , Rfl:  .  omeprazole (PRILOSEC) 20 MG capsule, Take 20 mg by mouth 2 (two) times daily before a meal. , Disp: , Rfl:  .  phenazopyridine (PYRIDIUM) 200 MG tablet, Take 200 mg by mouth 2 (two) times daily. , Disp: , Rfl:  .  tamsulosin (FLOMAX) 0.4 MG CAPS capsule, Take 0.4 mg by mouth daily. , Disp: , Rfl:  .  tiZANidine (ZANAFLEX) 4 MG capsule, Take 4 mg by mouth daily. , Disp: , Rfl:  .  traMADol (ULTRAM) 50 MG tablet, Take 50 mg by mouth 3 (three) times daily. , Disp: , Rfl:  .  zolpidem (AMBIEN CR) 12.5 MG CR tablet, Take 12.5 mg by mouth at bedtime. , Disp: , Rfl:   Physical exam:  Vitals:   02/21/17 1342  BP: 133/77  Pulse: (!) 102  Resp: (!) 1  Temp: 99.1 F  (37.3 C)  TempSrc: Tympanic  Weight: (!) 363 lb 12.1 oz (165 kg)   Physical Exam  Constitutional: He is  oriented to person, place, and time.  Obese, in no acute distress  HENT:  Head: Normocephalic and atraumatic.  Markedly enlarged LN in the left cervical region. Small palpable right sided cervical adenopathy  Eyes: EOM are normal. Pupils are equal, round, and reactive to light.  Neck: Normal range of motion.  Cardiovascular: Normal rate, regular rhythm and normal heart sounds.   Pulmonary/Chest: Effort normal and breath sounds normal.  Abdominal: Soft. Bowel sounds are normal.  Neurological: He is alert and oriented to person, place, and time.  Skin: Skin is warm and dry.     CMP Latest Ref Rng & Units 02/13/2017  Glucose 65 - 99 mg/dL 222(H)  BUN 6 - 20 mg/dL 11  Creatinine 0.61 - 1.24 mg/dL 0.81  Sodium 135 - 145 mmol/L 137  Potassium 3.5 - 5.1 mmol/L 4.0  Chloride 101 - 111 mmol/L 103  CO2 22 - 32 mmol/L 27  Calcium 8.9 - 10.3 mg/dL 8.8(L)  Total Protein 6.5 - 8.1 g/dL -  Total Bilirubin 0.3 - 1.2 mg/dL -  Alkaline Phos 38 - 126 U/L -  AST 15 - 41 U/L -  ALT 17 - 63 U/L -   CBC Latest Ref Rng & Units 02/13/2017  WBC 3.8 - 10.6 K/uL 8.7  Hemoglobin 13.0 - 18.0 g/dL 10.5(L)  Hematocrit 40.0 - 52.0 % 32.2(L)  Platelets 150 - 440 K/uL 281    No images are attached to the encounter.  Ct Soft Tissue Neck W Contrast  Result Date: 02/13/2017 CLINICAL DATA:  46 year old male with neck swelling, bilateral palpable lateral neck masses. Status post inconclusive left neck lymph node biopsy 3 weeks ago. Trouble breathing today. EXAM: CT NECK WITH CONTRAST TECHNIQUE: Multidetector CT imaging of the neck was performed using the standard protocol following the bolus administration of intravenous contrast. CONTRAST:  64mL ISOVUE-300 IOPAMIDOL (ISOVUE-300) INJECTION 61% COMPARISON:  None. FINDINGS: Pharynx and larynx: Negative larynx; the glottis is closed. Hypopharynx is within normal  limits. Mildly prominent bilateral palatine tonsils, although with speckled postinflammatory calcifications such that these are likely physiologic. There is increased symmetric appearing soft tissue in the nasopharynx suspicious for adenoid hypertrophy which would be atypical in this age group (series 2, image 23). Negative parapharyngeal and retropharyngeal spaces. Salivary glands: Negative sublingual space ; incidental left sublingual gland mylohyoid boutonniere (normal variant). The submandibular glands are normal aside from regional mass effect from bilateral abnormal cervical lymph nodes, worse on the left. See below. Bilateral parotid glands are within normal limits. Thyroid: Negative; subcentimeter left lobe hypodense nodule on series 2, image 82 which does not meet consensus criteria for ultrasound follow-up. Lymph nodes: Bulky solid-appearing left neck lymphadenopathy at the left level 2 nodal station with individual large rounded nodes measuring up to 44 mm long axis (27 mm short axis, series 2, image 38 and sagittal image 82). Mild mass effect on the posterior left submandibular gland. Elsewhere there are numerous abnormally rounded more mildly to moderately enlarged bilateral cervical lymph nodes. Right level IIa node or small nodal conglomeration measures 19 mm short axis (series 2, image 40). Bilateral level 3 nodes are rounded measuring 12-15 mm. Smaller bilateral level 4 lymph nodes also appear increased in number and measure up to 11 mm short axis. At the right level 1 B nodal station there is a 19 mm round focus of inflammation which may be an inflamed node (series 2, image 43). Nearby enlarged but noninflamed appearing 15 mm lymph node. There is associated asymmetric thickening of  the right platysma and subcutaneous inflammatory stranding which tracks inferiorly toward the midline strap muscles (series 2, image 56). No cystic or necrotic nodes identified. Vascular: Major vascular structures in the  neck and at the skullbase are patent. Limited intracranial: Negative. Visualized orbits: Not included. Mastoids and visualized paranasal sinuses: Mild left maxillary sinus mucous retention cyst. Otherwise clear. Skeleton: Occasional dental caries. Absent left posterior mandible dentition. Decreased bone detail in the lower cervical spine related to large body habitus. No acute or suspicious osseous lesion. Upper chest: Negative visualized lung parenchyma aside from possible mild centrilobular emphysema. Small but increased in number superior mediastinal lymph nodes. No axillary lymphadenopathy is visible. IMPRESSION: 1. Left greater than right cervical lymphadenopathy. The largest nodes are at the left level II station measuring up to 4.4 cm long axis (2.5-3 cm short axis). Smaller but abnormally rounded lymph nodes throughout the neck elsewhere. No cystic or necrotic nodes. Increased for age but symmetric appearing soft tissue at the nasopharynx. This constellation is most suspicious for Leukemia/lymphoma. Other lymphoproliferative disorders, including HIV, could also be considered. Nasopharyngeal carcinoma with lymph node metastases is less likely. The left neck nodes would be amenable to Ultrasound-guided Needle Biopsy. 2. Superimposed nonspecific appearing inflammatory process at the right level 1B nodal station, with associated overlying cellulitis. No associated fluid collection. Electronically Signed   By: Genevie Ann M.D.   On: 02/13/2017 15:42   Ct Chest Wo Contrast  Result Date: 02/14/2017 CLINICAL DATA:  Neck adenopathy and swelling, worsening. EXAM: CT CHEST WITHOUT CONTRAST TECHNIQUE: Multidetector CT imaging of the chest was performed following the standard protocol without IV contrast. COMPARISON:  02/13/2017 neck CT FINDINGS: Cardiovascular: Mild aortic arch and branch vessel atherosclerotic calcification. Mediastinum/Nodes: Scattered mediastinal and hilar lymph nodes are present. This includes  several small infraclavicular and superior mediastinal lymph nodes, some of which have indistinct margins ; a lymph node anterior to the SVC measuring 1.1 cm in short axis on image 62/2; a subcarinal node measuring 1.1 cm on image 77/2; a right hilar lymph node measuring about 1.1 cm on image 71/2 ; a right infrahilar node measuring 1.1 cm on image 84/2; and an anterior pericardial node measuring 0.8 cm on image 94/2. Small bilateral axillary lymph nodes are present. Lungs/Pleura: Centrilobular emphysema. Mild scarring in the right upper lobe and right middle lobe. Mild lingular scarring. Upper Abdomen: Dependent density in the gallbladder likely from sludge or gallstones. Small gastrohepatic ligament lymph nodes and scattered small porta hepatis lymph nodes, not overtly pathologically enlarged. Musculoskeletal: Dorsal column stimulator with lead tips projecting from T7 down to T12. 8 mm sclerotic lesion in the T10 vertebral body, likely benign/incidental but technically nonspecific. IMPRESSION: 1. Scattered upper normal to mildly enlarged lymph nodes in the mediastinum and upper abdomen come nonspecific for reactive versus neoplastic etiology. An index right hilar lymph node measures 1.1 cm in short axis. 2. Mild atherosclerosis. 3. Sludge or gallstones in the gallbladder. Electronically Signed   By: Van Clines M.D.   On: 02/14/2017 08:32     Assessment and plan- Patient is a 46 y.o. 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  I also spoke to Dr. Kathyrn Sheriff over the phone today. No primary mass was indetified in head and neck region on initial oropharyngealexam. However, on repeat NPL  exam he did notice mass in the left lateral nasopharyngeal wall. Hence nasopharyngeal SCC seems to be the  primary source. He atleast has Stage III disease T1N2Mx. HPV asssociated cancers are associated with better overall prognosis.    CT thorax also showed borderline enlarged mediastinal LN and upper abdomen as well. At this time I will get a PET/CT scan to look for metastatic disease as well as port placement in anticipation of chemotherapy. Patients blood sugars have been elevated which could be an issue getting PET/CT scan. If there is no evidence of metastatic disease, neck nodes could be potentially treated with concurrent chemo/RT with weekly cisplatin at 40 mg/meter square or 3 doses of cisplatin at 100 mg/meter square. Patient is seeing rad Onc tomorrow. I will discuss his case at this weeks tumor board as well. Dr. Kathyrn Sheriff is potentially considering elective neck dissection post chemo/RT. There is also a potential role for adjuvant chemotherapy after chemo/RT but this has not shown to improve OS  Patient will f/u with Dr. Mike Gip in 1 weeks time to discuss PET and biopsy results and hopefully start treatment next week which will likely be platinum based chemotherapy concurrently with RT. We will check cbc, cmp and EBV DNA PCR at that time. Patient cannot get MRI skull base as he has spinal cord stimulators in place 1 of which is not MRI compatible. I discussed risks versus benefits of cisplatin including all but not limited to nausea, vomiting, low blood counts, risk of infections as well as neuropathy, kidney injury and hearing loss associated with cisplati. Patient understands and agrees to proceed as planned. We will obtain baseline audiology evaluation as well. He will be attending chemo class this week   Total face to face encounter time for this patient visit was 30 min. >50% of the time was  spent in counseling and coordination of care.     Visit Diagnosis 1. NPC (nasopharyngeal carcinoma) ([place])      Dr. Randa Evens, MD, MPH [facility] Pager-  [phone] 02/21/2017 1:18 PM

## 2017-02-21 NOTE — Progress Notes (Signed)
Patient here today to be evaluated per Dr.Paul Juengal (ENT) related to left pharyngeal wall.  Patient is established patient of Dr. Drenda Freeze.  Biopsy has been performed on left neck.  Patient c/o  Throat being sore and swollen on that side.  States he is breathing fine but feels like his breaths are not deep enough.  Accompanied by his wife today.

## 2017-02-21 NOTE — Patient Instructions (Signed)
Cisplatin injection  What is this medicine?  CISPLATIN (SIS pla tin) is a chemotherapy drug. It targets fast dividing cells, like cancer cells, and causes these cells to die. This medicine is used to treat many types of cancer like bladder, ovarian, and testicular cancers.  This medicine may be used for other purposes; ask your health care provider or pharmacist if you have questions.  COMMON BRAND NAME(S): Platinol, Platinol -AQ  What should I tell my health care provider before I take this medicine?  They need to know if you have any of these conditions:  -blood disorders  -hearing problems  -kidney disease  -recent or ongoing radiation therapy  -an unusual or allergic reaction to cisplatin, carboplatin, other chemotherapy, other medicines, foods, dyes, or preservatives  -pregnant or trying to get pregnant  -breast-feeding  How should I use this medicine?  This drug is given as an infusion into a vein. It is administered in a hospital or clinic by a specially trained health care professional.  Talk to your pediatrician regarding the use of this medicine in children. Special care may be needed.  Overdosage: If you think you have taken too much of this medicine contact a poison control center or emergency room at once.  NOTE: This medicine is only for you. Do not share this medicine with others.  What if I miss a dose?  It is important not to miss a dose. Call your doctor or health care professional if you are unable to keep an appointment.  What may interact with this medicine?  -dofetilide  -foscarnet  -medicines for seizures  -medicines to increase blood counts like filgrastim, pegfilgrastim, sargramostim  -probenecid  -pyridoxine used with altretamine  -rituximab  -some antibiotics like amikacin, gentamicin, neomycin, polymyxin B, streptomycin, tobramycin  -sulfinpyrazone  -vaccines  -zalcitabine  Talk to your doctor or health care professional before taking any of these  medicines:  -acetaminophen  -aspirin  -ibuprofen  -ketoprofen  -naproxen  This list may not describe all possible interactions. Give your health care provider a list of all the medicines, herbs, non-prescription drugs, or dietary supplements you use. Also tell them if you smoke, drink alcohol, or use illegal drugs. Some items may interact with your medicine.  What should I watch for while using this medicine?  Your condition will be monitored carefully while you are receiving this medicine. You will need important blood work done while you are taking this medicine.  This drug may make you feel generally unwell. This is not uncommon, as chemotherapy can affect healthy cells as well as cancer cells. Report any side effects. Continue your course of treatment even though you feel ill unless your doctor tells you to stop.  In some cases, you may be given additional medicines to help with side effects. Follow all directions for their use.  Call your doctor or health care professional for advice if you get a fever, chills or sore throat, or other symptoms of a cold or flu. Do not treat yourself. This drug decreases your body's ability to fight infections. Try to avoid being around people who are sick.  This medicine may increase your risk to bruise or bleed. Call your doctor or health care professional if you notice any unusual bleeding.  Be careful brushing and flossing your teeth or using a toothpick because you may get an infection or bleed more easily. If you have any dental work done, tell your dentist you are receiving this medicine.  Avoid taking products   that contain aspirin, acetaminophen, ibuprofen, naproxen, or ketoprofen unless instructed by your doctor. These medicines may hide a fever.  Do not become pregnant while taking this medicine. Women should inform their doctor if they wish to become pregnant or think they might be pregnant. There is a potential for serious side effects to an unborn child. Talk to  your health care professional or pharmacist for more information. Do not breast-feed an infant while taking this medicine.  Drink fluids as directed while you are taking this medicine. This will help protect your kidneys.  Call your doctor or health care professional if you get diarrhea. Do not treat yourself.  What side effects may I notice from receiving this medicine?  Side effects that you should report to your doctor or health care professional as soon as possible:  -allergic reactions like skin rash, itching or hives, swelling of the face, lips, or tongue  -signs of infection - fever or chills, cough, sore throat, pain or difficulty passing urine  -signs of decreased platelets or bleeding - bruising, pinpoint red spots on the skin, black, tarry stools, nosebleeds  -signs of decreased red blood cells - unusually weak or tired, fainting spells, lightheadedness  -breathing problems  -changes in hearing  -gout pain  -low blood counts - This drug may decrease the number of white blood cells, red blood cells and platelets. You may be at increased risk for infections and bleeding.  -nausea and vomiting  -pain, swelling, redness or irritation at the injection site  -pain, tingling, numbness in the hands or feet  -problems with balance, movement  -trouble passing urine or change in the amount of urine  Side effects that usually do not require medical attention (report to your doctor or health care professional if they continue or are bothersome):  -changes in vision  -loss of appetite  -metallic taste in the mouth or changes in taste  This list may not describe all possible side effects. Call your doctor for medical advice about side effects. You may report side effects to FDA at 1-800-FDA-1088.  Where should I keep my medicine?  This drug is given in a hospital or clinic and will not be stored at home.  NOTE: This sheet is a summary. It may not cover all possible information. If you have questions about this medicine,  talk to your doctor, pharmacist, or health care provider.   2018 Elsevier/Gold Standard (2008-02-13 14:40:54)

## 2017-02-22 ENCOUNTER — Telehealth (INDEPENDENT_AMBULATORY_CARE_PROVIDER_SITE_OTHER): Payer: Self-pay

## 2017-02-22 ENCOUNTER — Other Ambulatory Visit: Payer: Self-pay | Admitting: *Deleted

## 2017-02-22 ENCOUNTER — Ambulatory Visit
Admission: RE | Admit: 2017-02-22 | Discharge: 2017-02-22 | Disposition: A | Discharge status: Home / Self Care | Payer: BLUE CROSS/BLUE SHIELD | Source: Ambulatory Visit | Attending: Vascular Surgery | Admitting: Vascular Surgery

## 2017-02-22 ENCOUNTER — Ambulatory Visit
Admission: RE | Admit: 2017-02-22 | Discharge: 2017-02-22 | Disposition: A | Discharge status: Home / Self Care | Payer: BLUE CROSS/BLUE SHIELD | Source: Ambulatory Visit | Attending: Radiation Oncology | Admitting: Radiation Oncology

## 2017-02-22 ENCOUNTER — Other Ambulatory Visit (INDEPENDENT_AMBULATORY_CARE_PROVIDER_SITE_OTHER): Payer: Self-pay | Admitting: Vascular Surgery

## 2017-02-22 ENCOUNTER — Inpatient Hospital Stay: Payer: BLUE CROSS/BLUE SHIELD

## 2017-02-22 ENCOUNTER — Encounter: Payer: Self-pay | Admitting: Radiation Oncology

## 2017-02-22 ENCOUNTER — Encounter
Admission: RE | Disposition: A | Discharge status: Home / Self Care | Payer: Self-pay | Source: Ambulatory Visit | Attending: Vascular Surgery

## 2017-02-22 VITALS — BP 133/78 | HR 87 | Temp 98.0°F | Resp 18 | Wt 365.1 lb

## 2017-02-22 DIAGNOSIS — Z969 Presence of functional implant, unspecified: Secondary | ICD-10-CM | POA: Diagnosis not present

## 2017-02-22 DIAGNOSIS — Z87891 Personal history of nicotine dependence: Secondary | ICD-10-CM | POA: Insufficient documentation

## 2017-02-22 DIAGNOSIS — I10 Essential (primary) hypertension: Secondary | ICD-10-CM | POA: Insufficient documentation

## 2017-02-22 DIAGNOSIS — E669 Obesity, unspecified: Secondary | ICD-10-CM | POA: Insufficient documentation

## 2017-02-22 DIAGNOSIS — E1140 Type 2 diabetes mellitus with diabetic neuropathy, unspecified: Secondary | ICD-10-CM | POA: Diagnosis not present

## 2017-02-22 DIAGNOSIS — Z794 Long term (current) use of insulin: Secondary | ICD-10-CM | POA: Insufficient documentation

## 2017-02-22 DIAGNOSIS — G629 Polyneuropathy, unspecified: Secondary | ICD-10-CM | POA: Insufficient documentation

## 2017-02-22 DIAGNOSIS — Z833 Family history of diabetes mellitus: Secondary | ICD-10-CM | POA: Insufficient documentation

## 2017-02-22 DIAGNOSIS — K219 Gastro-esophageal reflux disease without esophagitis: Secondary | ICD-10-CM | POA: Insufficient documentation

## 2017-02-22 DIAGNOSIS — D4702 Systemic mastocytosis: Secondary | ICD-10-CM | POA: Diagnosis not present

## 2017-02-22 DIAGNOSIS — J449 Chronic obstructive pulmonary disease, unspecified: Secondary | ICD-10-CM | POA: Insufficient documentation

## 2017-02-22 DIAGNOSIS — F329 Major depressive disorder, single episode, unspecified: Secondary | ICD-10-CM | POA: Insufficient documentation

## 2017-02-22 DIAGNOSIS — Z9889 Other specified postprocedural states: Secondary | ICD-10-CM | POA: Diagnosis not present

## 2017-02-22 DIAGNOSIS — Z510 Encounter for antineoplastic radiation therapy: Secondary | ICD-10-CM | POA: Diagnosis present

## 2017-02-22 DIAGNOSIS — Z8249 Family history of ischemic heart disease and other diseases of the circulatory system: Secondary | ICD-10-CM | POA: Insufficient documentation

## 2017-02-22 DIAGNOSIS — C770 Secondary and unspecified malignant neoplasm of lymph nodes of head, face and neck: Secondary | ICD-10-CM

## 2017-02-22 DIAGNOSIS — G4730 Sleep apnea, unspecified: Secondary | ICD-10-CM | POA: Insufficient documentation

## 2017-02-22 DIAGNOSIS — D4701 Cutaneous mastocytosis: Secondary | ICD-10-CM | POA: Insufficient documentation

## 2017-02-22 DIAGNOSIS — E119 Type 2 diabetes mellitus without complications: Secondary | ICD-10-CM | POA: Insufficient documentation

## 2017-02-22 DIAGNOSIS — Z7982 Long term (current) use of aspirin: Secondary | ICD-10-CM | POA: Insufficient documentation

## 2017-02-22 DIAGNOSIS — C119 Malignant neoplasm of nasopharynx, unspecified: Secondary | ICD-10-CM | POA: Insufficient documentation

## 2017-02-22 DIAGNOSIS — D649 Anemia, unspecified: Secondary | ICD-10-CM | POA: Diagnosis not present

## 2017-02-22 DIAGNOSIS — Z79899 Other long term (current) drug therapy: Secondary | ICD-10-CM | POA: Diagnosis not present

## 2017-02-22 HISTORY — PX: PORTA CATH INSERTION: CATH118285

## 2017-02-22 LAB — AEROBIC/ANAEROBIC CULTURE W GRAM STAIN (SURGICAL/DEEP WOUND): Culture: NO GROWTH

## 2017-02-22 LAB — AEROBIC/ANAEROBIC CULTURE (SURGICAL/DEEP WOUND)

## 2017-02-22 SURGERY — PORTA CATH INSERTION
Anesthesia: Moderate Sedation

## 2017-02-22 MED ORDER — FENTANYL CITRATE (PF) 100 MCG/2ML IJ SOLN
INTRAMUSCULAR | Status: DC | PRN
  Administered 2017-02-22 (×2): 50 ug via INTRAVENOUS

## 2017-02-22 MED ORDER — ONDANSETRON HCL 4 MG/2ML IJ SOLN: 4.0000 mg | Freq: Four times a day (QID) | INTRAMUSCULAR | Status: DC | PRN

## 2017-02-22 MED ORDER — LIDOCAINE-EPINEPHRINE (PF) 2 %-1:200000 IJ SOLN
INTRAMUSCULAR | Status: AC
  Filled 2017-02-22: qty 20

## 2017-02-22 MED ORDER — MIDAZOLAM HCL 2 MG/2ML IJ SOLN
INTRAMUSCULAR | Status: AC
  Filled 2017-02-22: qty 2

## 2017-02-22 MED ORDER — ALPRAZOLAM 1 MG PO TABS: 1.0000 mg | ORAL_TABLET | Freq: Once | ORAL | 0 refills | Status: AC

## 2017-02-22 MED ORDER — HYDROMORPHONE HCL 1 MG/ML IJ SOLN: 1.0000 mg | Freq: Once | INTRAMUSCULAR | Status: DC | PRN

## 2017-02-22 MED ORDER — MIDAZOLAM HCL 2 MG/2ML IJ SOLN
INTRAMUSCULAR | Status: DC | PRN
  Administered 2017-02-22 (×2): 2 mg via INTRAVENOUS

## 2017-02-22 MED ORDER — SODIUM CHLORIDE 0.9 % IR SOLN
Freq: Once | Status: DC
  Filled 2017-02-22: qty 2

## 2017-02-22 MED ORDER — FENTANYL CITRATE (PF) 100 MCG/2ML IJ SOLN
INTRAMUSCULAR | Status: AC
  Filled 2017-02-22: qty 2

## 2017-02-22 MED ORDER — LIDOCAINE-EPINEPHRINE (PF) 2 %-1:200000 IJ SOLN
INTRAMUSCULAR | Status: AC
Start: 2017-02-22 — End: 2017-02-22
  Filled 2017-02-22: qty 20

## 2017-02-22 MED ORDER — GENTAMICIN SULFATE 40 MG/ML IJ SOLN: Freq: Once | INTRAMUSCULAR | Status: DC

## 2017-02-22 MED ORDER — SODIUM CHLORIDE 0.9 % IV SOLN: INTRAVENOUS | Status: DC

## 2017-02-22 MED ORDER — CEFAZOLIN SODIUM-DEXTROSE 2-4 GM/100ML-% IV SOLN
2.0000 g | Freq: Once | INTRAVENOUS | Status: AC
  Administered 2017-02-22: 2 g via INTRAVENOUS

## 2017-02-22 SURGICAL SUPPLY — 7 items
KIT PORT POWER 8FR ISP CVUE (Catheter) ×2 IMPLANT
PACK ANGIOGRAPHY (CUSTOM PROCEDURE TRAY) ×2 IMPLANT
PAD GROUND ADULT SPLIT (MISCELLANEOUS) ×2 IMPLANT
PENCIL ELECTRO HAND CTR (MISCELLANEOUS) ×2 IMPLANT
SUT MNCRL AB 4-0 PS2 18 (SUTURE) ×2 IMPLANT
SUT PROLENE 0 CT 1 30 (SUTURE) ×2 IMPLANT
SUTURE VIC 3-0 (SUTURE) ×2 IMPLANT

## 2017-02-22 NOTE — Consult Note (Signed)
NEW PATIENT EVALUATION  Name: Dwayne Richard  MRN: [id number]  Date:   02/22/2017     DOB: [birth year]   This 46 y.o. 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  REFERRING PHYSICIAN: Maryland Pink, MD  CHIEF COMPLAINT:  Chief Complaint  Patient presents with  . Squamous Cell Carcinoma    Pharyngeal wall left   initial eval    DIAGNOSIS: The encounter diagnosis was NPC (nasopharyngeal carcinoma) ([place]).   PREVIOUS INVESTIGATIONS:  CT scans reviewed PET CT scan has been ordered Pathology report reviewed Clinical notes reviewed  HPI: Patient is a 46 year old male with history of mastocytosis which she presented with in 2006. He has been followed by endocrinology was noted to have swelling in his right neck underwent ultrasound-guided biopsy showing monotonous lymphoid population. This was performed in February 2018. He went on to present with increasing swelling in his left neck was noted on CT scan to have a 4.4 cm mass with other enlarged lymph nodes throughout the head and neck region. CT scan of the thorax showed scattered upper normal to mildly enlarged lymph nodes in the mediastinum and upper abdomen. He eventually presented again with increasing neck swelling more on the left side and dysphasia he was seen by ENT who performed excisional biopsy of the cervical lymph node showing metastatic squamous cell carcinoma P 16 positive. Upon verbal report nasopharyngoscopy demonstrated mass in the nasopharynx consistent with nasopharyngeal primary. Patient is scheduled for a PET CT scan early next week. He is doing fairly well still has some minor dysphasia and slight pain in his throat. He is having a port placed today. He is seen today for radiation oncology opinion. Patient is morbidly obese.  PLANNED TREATMENT REGIMEN: Concurrent chemoradiation  PAST MEDICAL HISTORY:  has a past medical  history of COPD (chronic obstructive pulmonary disease) ([place]); Depression; Diabetes mellitus without complication ([place]); GERD (gastroesophageal reflux disease); Hypertension; Neuropathy ([place]); S/P insertion of spinal cord stimulator; Sleep apnea; and Systemic mastocytosis.    PAST SURGICAL HISTORY:  Past Surgical History:  Procedure Laterality Date  . cataract surgery  08/06/2014  . cataract surgery  11/05/2014  . MASS BIOPSY Left 02/17/2017   Procedure: NECK MASS BIOPSY;  Surgeon: Margaretha Sheffield, MD;  Location: [facility];  Service: ENT;  Laterality: Left;  NEED HARMONIC SCAPLE diabetic - insulin and oral meds sleep apnea  . SEPTOPLASTY  12/15/2011  . Spinal implants  10/06/2011  . TYMPANOSTOMY TUBE PLACEMENT Right 11/03/2011  . X-STOP IMPLANTATION Right 07/25/2013    FAMILY HISTORY: family history includes Diabetes in his father and paternal grandmother; Hypertension in his father.  SOCIAL HISTORY:  reports that he quit smoking about 2 years ago. He has never used smokeless tobacco. He reports that he does not drink alcohol or use drugs.  ALLERGIES: Patient has no known allergies.  MEDICATIONS:  Current Outpatient Prescriptions  Medication Sig Dispense Refill  . albuterol (PROVENTIL HFA;VENTOLIN HFA) 108 (90 Base) MCG/ACT inhaler Inhale 2 puffs into the lungs every 6 (six) hours as needed for wheezing.     Marland Kitchen ALPRAZolam (XANAX) 1 MG tablet Take 1 tablet (1 mg total) by mouth once. Take 1 hour prior to PET scan 1 tablet 0  . amitriptyline (ELAVIL) 25 MG tablet Take 25 mg by mouth daily.    Marland Kitchen amLODipine (NORVASC) 5 MG tablet Take 5 mg by mouth daily.    Marland Kitchen amoxicillin-clavulanate (AUGMENTIN) 875-125 MG  tablet Take 1 tablet by mouth every 12 (twelve) hours. 14 tablet 0  . aspirin 500 MG tablet Take 500 mg by mouth 2 (two) times daily.    Marland Kitchen atorvastatin (LIPITOR) 20 MG tablet Take 20 mg by mouth daily.     . Calcium Citrate 200 MG TABS Take 1,000 mg by mouth 2 (two) times daily.      . Cetirizine HCl 10 MG CAPS Take 10 mg by mouth 2 (two) times daily.     . cimetidine (TAGAMET) 400 MG tablet Take 400 mg by mouth 2 (two) times daily.     . cromolyn (GASTROCROM) 100 MG/5ML solution Take 200 mg by mouth 4 (four) times daily -  before meals and at bedtime.     . diphenhydrAMINE (BENADRYL) 50 MG capsule Take 50 mg by mouth 2 (two) times daily.     Marland Kitchen EPINEPHrine 0.3 mg/0.3 mL IJ SOAJ injection Inject 0.3 mg into the muscle once.     . fluticasone (FLONASE) 50 MCG/ACT nasal spray Place 2 sprays into both nostrils daily.     Marland Kitchen gabapentin (NEURONTIN) 600 MG tablet Take 1,200 mg by mouth 3 (three) times daily.     Marland Kitchen glimepiride (AMARYL) 4 MG tablet Take 4 mg by mouth 2 (two) times daily.     Marland Kitchen glucagon (GLUCAGON EMERGENCY) 1 MG injection Inject 1 mg into the muscle once as needed.     . imatinib (GLEEVEC) 400 MG tablet Take 1 tablet (400 mg total) by mouth daily. 30 tablet 0  . Insulin Glargine (LANTUS [place]) Inject 90 Units into the skin daily.     . insulin regular (NOVOLIN R,HUMULIN R) 100 units/mL injection Inject 80 Units into the skin 3 (three) times daily as needed.     . Insulin Syringe-Needle U-100 (B-D INS SYR ULTRAFINE .3CC/30G) 30G X 1/2" 0.3 ML MISC Use 1 Syringe as directed.    . Lancets 28G MISC Use 1 each 3 (three) times daily. Use as instructed.    Marland Kitchen losartan (COZAAR) 100 MG tablet Take 100 mg by mouth daily.     . Melatonin 10 MG TABS Take 10 mg by mouth at bedtime.     . metFORMIN (GLUCOPHAGE) 1000 MG tablet Take 2,000 mg by mouth daily with breakfast.     . Multiple Vitamin (MULTIVITAMIN) tablet Take 1 tablet by mouth daily.    Marland Kitchen omeprazole (PRILOSEC) 20 MG capsule Take 20 mg by mouth 2 (two) times daily before a meal.     . phenazopyridine (PYRIDIUM) 200 MG tablet Take 200 mg by mouth 2 (two) times daily.     . tamsulosin (FLOMAX) 0.4 MG CAPS capsule Take 0.4 mg by mouth daily.     Marland Kitchen tiZANidine (ZANAFLEX) 4 MG capsule Take 4 mg by mouth daily.     . traMADol  (ULTRAM) 50 MG tablet Take 50 mg by mouth 3 (three) times daily.     Marland Kitchen zolpidem (AMBIEN CR) 12.5 MG CR tablet Take 12.5 mg by mouth at bedtime.      No current facility-administered medications for this encounter.     ECOG PERFORMANCE STATUS:  1 - Symptomatic but completely ambulatory  REVIEW OF SYSTEMS: Except for the dysphasia and sore throat Patient denies any weight loss, fatigue, weakness, fever, chills or night sweats. Patient denies any loss of vision, blurred vision. Patient denies any ringing  of the ears or hearing loss. No irregular heartbeat. Patient denies heart murmur or history of fainting. Patient denies any  chest pain or pain radiating to her upper extremities. Patient denies any shortness of breath, difficulty breathing at night, cough or hemoptysis. Patient denies any swelling in the lower legs. Patient denies any nausea vomiting, vomiting of blood, or coffee ground material in the vomitus. Patient denies any stomach pain. Patient states has had normal bowel movements no significant constipation or diarrhea. Patient denies any dysuria, hematuria or significant nocturia. Patient denies any problems walking, swelling in the joints or loss of balance. Patient denies any skin changes, loss of hair or loss of weight. Patient denies any excessive worrying or anxiety or significant depression. Patient denies any problems with insomnia. Patient denies excessive thirst, polyuria, polydipsia. Patient denies any swollen glands, patient denies easy bruising or easy bleeding. Patient denies any recent infections, allergies or URI. Patient "s visual fields have not changed significantly in recent time.    PHYSICAL EXAM: BP 133/78   Pulse 87   Temp 98 F (36.7 C)   Resp 18   Wt (!) 365 lb 1.3 oz (165.6 kg)   BMI 45.63 kg/m  Well-developed obese male in NAD. Teeth are in fair state of repair no oral mucosal lesions are identified indirect mirror examination is difficult. Posterior oropharynx  and base of tongue appear within normal limits. He has significant adenopathy in the neck bilaterally greater on the left with a large mid cervical node palpated. No supraclavicular adenopathy is identified. Well-developed well-nourished patient in NAD. HEENT reveals PERLA, EOMI, discs not visualized.  Oral cavity is clear. No oral mucosal lesions are identified. Neck is clear without evidence of cervical or supraclavicular adenopathy. Lungs are clear to A&P. Cardiac examination is essentially unremarkable with regular rate and rhythm without murmur rub or thrill. Abdomen is benign with no organomegaly or masses noted. Motor sensory and DTR levels are equal and symmetric in the upper and lower extremities. Cranial nerves II through XII are grossly intact. Proprioception is intact. No peripheral adenopathy or edema is identified. No motor or sensory levels are noted. Crude visual fields are within normal range.  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 We'll present his case at our weekly tumor conference. I like to go ahead and start treatment planning with curative intent for locally advanced squamous cell carcinoma the nasopharynx. Would plan on delivering 7000 cGy to areas of the nasopharynx and cervical nodes that are hypermetabolic. Would treat remainder of nodes in the head and neck region to 5400 cGy using I MRT dose painting technique. Risks and benefits of treatment including alteration of taste fatigue dryness of the mouth skin changes alteration of blood counts dysphasia all were discussed in detail with the patient and his wife. I first set up and ordered CT simulation. There will be extra effort by both professional staff as well as technical staff to coordinate and manage  concurrent chemoradiation and ensuing side effects during his treatments. We'll use PET CT fusion study to plan his radiation therapy treatments.  I would like to take this opportunity to thank you for allowing me to participate in the care of your patient.Marland Kitchen

## 2017-02-22 NOTE — Discharge Instructions (Signed)
Tunneled Catheter Insertion, Care After  Refer to this sheet in the next few weeks. These instructions provide you with information about caring for yourself after your procedure. Your health care provider may also give you more specific instructions. Your treatment has been planned according to current medical practices, but problems sometimes occur. Call your health care provider if you have any problems or questions after your procedure.  What can I expect after the procedure?  After the procedure, it is common to have:  · Some mild redness, swelling, and pain around your catheter site.  · A small amount of blood or clear fluid coming from your incisions.    Follow these instructions at home:  Incision care   · Check your incision areas every day for signs of infection. Check for:  ? More redness, swelling, or pain.  ? More fluid or blood.  ? Warmth.  ? Pus or a bad smell.  · Follow instructions from your health care provider about how to take care of your incisions. Make sure you:  ? Wash your hands with soap and water before you change your bandages (dressings). If soap and water are not available, use hand sanitizer.  ? Change your dressings as told by your health care provider. Wash the area around your incisions with a germ-killing (antiseptic) solution when you change your dressing, as told by your health care provider.  ? Leave stitches (sutures), skin glue, or adhesive strips in place. These skin closures may need to stay in place for 2 weeks or longer. If adhesive strip edges start to loosen and curl up, you may trim the loose edges. Do not remove adhesive strips completely unless your health care provider tells you to do that.  Catheter Care     · Wash your hands with soap and water before and after caring for your catheter. If soap and water are not available, use hand sanitizer.  · Keep your catheter site and your dressings clean and dry.  · Apply an antibiotic ointment to your catheter site as told  by your health care provider.  · Flush your catheter as told by your health care provider. This helps prevent it from becoming clogged.  · Do not open the caps on the ends of the catheter.  · Do not pull on your catheter.  · If your catheter is in your arm:  ? Avoid wearing tight clothes or tight jewelry on your arm that has the catheter.  ? Do not sleep with your head on the arm that has the catheter.  ? Do not allow your blood pressure to be taken on the arm that has the catheter.  ? Do not allow your blood to be drawn from the arm that has the catheter, except through the catheter itself.  Medicines   · Take over-the-counter and prescription medicines only as told by your health care provider.  · If you were prescribed an antibiotic medicine, take it as told by your health care provider. Do not stop taking the antibiotic even if you start to feel better.  Activity   · Return to your normal activities as told by your health care provider. Ask your health care provider what activities are safe for you.  · Do not lift anything that is heavier than 10 lb (4.5 kg) for 3 weeks or as long as told by your health care provider.  Driving   · Do not drive until your health care provider approves.  ·   Do not drive or operate heavy machinery while taking prescription pain medicine.  General instructions   · Follow your health care provider's specific instructions for the type of catheter that you have.  · Do not take baths, swim, or use a hot tub until your health care provider approves.  · Follow instructions from your health care provider about eating or drinking restrictions.  · Wear compression stockings as told by your health care provider. These stockings help to prevent blood clots and reduce swelling in your legs.  · Keep all follow-up visits as told by your health care provider. This is important.  Contact a health care provider if:  · You have more fluid or blood coming from your incisions.  · You have more redness,  swelling, or pain at your incisions or around the area where your catheter is inserted.  · Your incisions feel warm to the touch.  · You feel unusually weak.  · You feel nauseous.  · Your catheter is not working properly.  · You have blood or fluid draining from your catheter.  · You are unable to flush your catheter.  Get help right away if:  · Your catheter breaks.  · A hole develops in your catheter.  · Your catheter comes loose or gets pulled completely out. If this happens, press on your catheter site firmly with your hand or a clean cloth until you get medical help.  · Your catheter becomes blocked.  · You have swelling in your arm, shoulder, neck, or face.  · You develop chest pain.  · You have difficulty breathing.  · You feel dizzy or light-headed.  · You have pus or a bad smell coming from your incisions.  · You have a fever.  · You develop bleeding from your catheter or your insertion site, and your bleeding does not stop.  This information is not intended to replace advice given to you by your health care provider. Make sure you discuss any questions you have with your health care provider.  Document Released: 10/25/2012 Document Revised: 07/11/2016 Document Reviewed: 08/04/2015  Elsevier Interactive Patient Education © 2017 [employer]

## 2017-02-22 NOTE — Telephone Encounter (Signed)
Attempted to contact patient to schedule him for a port placement, I had to leave messages on both the home and mobile numbers for patient to return my call.

## 2017-02-22 NOTE — Op Note (Signed)
      [facility]       Operative Note  Date: 02/22/2017  Preoperative diagnosis:  1. Nasopharyngeal carcinoma, anemia, mastocytosis, poor venous access  Postoperative diagnosis:  Same as above  Procedures: #1. Ultrasound guidance for vascular access to the right internal jugular vein. #2. Fluoroscopic guidance for placement of catheter. #3. Placement of CT compatible Port-A-Cath, right internal jugular vein.  Surgeon: Leotis Pain, MD.   Anesthesia: Local with moderate conscious sedation for approximately 25  minutes using 4 mg of Versed and 100 mcg of Fentanyl  Fluoroscopy time: less than 1 minute  Contrast used: 0  Estimated blood loss: 10 cc  Indication for the procedure:  The patient is a 46 y.o.male with multiple issues and poor venous access.  The patient needs a Port-A-Cath for durable venous access, chemotherapy, lab draws, and CT scans. We are asked to place this. Risks and benefits were discussed and informed consent was obtained.  Description of procedure: The patient was brought to the vascular and interventional radiology suite.  Moderate conscious sedation was administered throughout the procedure during a face to face encounter with the patient with my supervision of the RN administering medicines and monitoring the patient's vital signs, pulse oximetry, telemetry and mental status throughout from the start of the procedure until the patient was taken to the recovery room. The right neck chest and shoulder were sterilely prepped and draped, and a sterile surgical field was created. Ultrasound was used to help visualize a patent right internal jugular vein. This was then accessed under direct ultrasound guidance without difficulty with the Seldinger needle and a permanent image was recorded. A J-wire was placed. After skin nick and dilatation, the peel-away sheath was then placed over the wire. I then anesthetized an area under the clavicle approximately 1-2  fingerbreadths. A transverse incision was created and an inferior pocket was created with electrocautery and blunt dissection. The port was then brought onto the field, placed into the pocket and secured to the chest wall with 2 Prolene sutures. The catheter was connected to the port and tunneled from the subclavicular incision to the access site. Fluoroscopic guidance was then used to cut the catheter to an appropriate length. The catheter was then placed through the peel-away sheath and the peel-away sheath was removed. The catheter tip was parked in excellent location under fluorocoscopic guidance in the cavoatrial junction. The pocket was then irrigated with antibiotic impregnated saline and the wound was closed with a running 3-0 Vicryl and a 4-0 Monocryl. The access incision was closed with a single 4-0 Monocryl. The Huber needle was used to withdraw blood and flush the port with heparinized saline. Dermabond was then placed as a dressing. The patient tolerated the procedure well and was taken to the recovery room in stable condition.   Leotis Pain 02/22/2017 5:38 PM   This note was created with Dragon Medical transcription system. Any errors in dictation are purely unintentional.

## 2017-02-22 NOTE — H&P (Signed)
 [facility] History & Physical Update  The patient was interviewed and re-examined.  The patient's previous History and Physical has been reviewed and is unchanged.  There is no change in the plan of care. We plan to proceed with the scheduled procedure.  Leotis Pain, MD  02/22/2017, 3:57 PM

## 2017-02-22 NOTE — Telephone Encounter (Signed)
Patient contacted and scheduled for 02/22/17 for port placement.

## 2017-02-23 ENCOUNTER — Encounter: Payer: Self-pay | Admitting: Vascular Surgery

## 2017-02-23 ENCOUNTER — Other Ambulatory Visit: Payer: Self-pay | Admitting: Pathology

## 2017-02-24 ENCOUNTER — Inpatient Hospital Stay: Payer: BLUE CROSS/BLUE SHIELD

## 2017-02-24 ENCOUNTER — Telehealth: Payer: Self-pay | Admitting: *Deleted

## 2017-02-24 ENCOUNTER — Ambulatory Visit
Admission: RE | Admit: 2017-02-24 | Discharge: 2017-02-24 | Disposition: A | Discharge status: Home / Self Care | Payer: BLUE CROSS/BLUE SHIELD | Source: Ambulatory Visit | Attending: Radiation Oncology | Admitting: Radiation Oncology

## 2017-02-24 ENCOUNTER — Ambulatory Visit: Payer: BLUE CROSS/BLUE SHIELD

## 2017-02-24 DIAGNOSIS — Z510 Encounter for antineoplastic radiation therapy: Secondary | ICD-10-CM | POA: Diagnosis not present

## 2017-02-24 NOTE — Telephone Encounter (Signed)
Called patient with appt changes voiced understanding

## 2017-02-28 ENCOUNTER — Ambulatory Visit
Admission: RE | Admit: 2017-02-28 | Discharge: 2017-02-28 | Disposition: A | Discharge status: Home / Self Care | Payer: BLUE CROSS/BLUE SHIELD | Source: Ambulatory Visit | Attending: Oncology | Admitting: Oncology

## 2017-02-28 ENCOUNTER — Inpatient Hospital Stay: Payer: BLUE CROSS/BLUE SHIELD

## 2017-02-28 ENCOUNTER — Inpatient Hospital Stay: Payer: BLUE CROSS/BLUE SHIELD | Admitting: Hematology and Oncology

## 2017-02-28 DIAGNOSIS — K760 Fatty (change of) liver, not elsewhere classified: Secondary | ICD-10-CM | POA: Diagnosis not present

## 2017-02-28 DIAGNOSIS — C801 Malignant (primary) neoplasm, unspecified: Secondary | ICD-10-CM | POA: Diagnosis not present

## 2017-02-28 DIAGNOSIS — C770 Secondary and unspecified malignant neoplasm of lymph nodes of head, face and neck: Secondary | ICD-10-CM | POA: Insufficient documentation

## 2017-02-28 DIAGNOSIS — R591 Generalized enlarged lymph nodes: Secondary | ICD-10-CM

## 2017-02-28 DIAGNOSIS — I700 Atherosclerosis of aorta: Secondary | ICD-10-CM | POA: Insufficient documentation

## 2017-02-28 DIAGNOSIS — K449 Diaphragmatic hernia without obstruction or gangrene: Secondary | ICD-10-CM | POA: Diagnosis not present

## 2017-02-28 DIAGNOSIS — K5730 Diverticulosis of large intestine without perforation or abscess without bleeding: Secondary | ICD-10-CM | POA: Diagnosis not present

## 2017-02-28 LAB — GLUCOSE, CAPILLARY: Glucose-Capillary: 108 mg/dL — ABNORMAL HIGH (ref 65–99)

## 2017-02-28 MED ORDER — FLUDEOXYGLUCOSE F - 18 (FDG) INJECTION
12.0000 | Freq: Once | INTRAVENOUS | Status: AC | PRN
  Administered 2017-02-28: 11.809 via INTRAVENOUS

## 2017-03-01 ENCOUNTER — Inpatient Hospital Stay: Payer: BLUE CROSS/BLUE SHIELD

## 2017-03-02 ENCOUNTER — Inpatient Hospital Stay (HOSPITAL_BASED_OUTPATIENT_CLINIC_OR_DEPARTMENT_OTHER): Payer: BLUE CROSS/BLUE SHIELD | Admitting: Hematology and Oncology

## 2017-03-02 ENCOUNTER — Inpatient Hospital Stay: Payer: BLUE CROSS/BLUE SHIELD

## 2017-03-02 ENCOUNTER — Other Ambulatory Visit: Payer: Self-pay | Admitting: *Deleted

## 2017-03-02 ENCOUNTER — Encounter: Payer: Self-pay | Admitting: Hematology and Oncology

## 2017-03-02 DIAGNOSIS — G4730 Sleep apnea, unspecified: Secondary | ICD-10-CM

## 2017-03-02 DIAGNOSIS — G629 Polyneuropathy, unspecified: Secondary | ICD-10-CM

## 2017-03-02 DIAGNOSIS — R1310 Dysphagia, unspecified: Secondary | ICD-10-CM | POA: Diagnosis not present

## 2017-03-02 DIAGNOSIS — I2510 Atherosclerotic heart disease of native coronary artery without angina pectoris: Secondary | ICD-10-CM | POA: Diagnosis not present

## 2017-03-02 DIAGNOSIS — C01 Malignant neoplasm of base of tongue: Secondary | ICD-10-CM | POA: Diagnosis not present

## 2017-03-02 DIAGNOSIS — D4702 Systemic mastocytosis: Secondary | ICD-10-CM

## 2017-03-02 DIAGNOSIS — J449 Chronic obstructive pulmonary disease, unspecified: Secondary | ICD-10-CM

## 2017-03-02 DIAGNOSIS — I10 Essential (primary) hypertension: Secondary | ICD-10-CM

## 2017-03-02 DIAGNOSIS — D649 Anemia, unspecified: Secondary | ICD-10-CM | POA: Diagnosis not present

## 2017-03-02 DIAGNOSIS — Z79899 Other long term (current) drug therapy: Secondary | ICD-10-CM

## 2017-03-02 DIAGNOSIS — K8020 Calculus of gallbladder without cholecystitis without obstruction: Secondary | ICD-10-CM

## 2017-03-02 DIAGNOSIS — K219 Gastro-esophageal reflux disease without esophagitis: Secondary | ICD-10-CM

## 2017-03-02 DIAGNOSIS — F329 Major depressive disorder, single episode, unspecified: Secondary | ICD-10-CM

## 2017-03-02 DIAGNOSIS — E119 Type 2 diabetes mellitus without complications: Secondary | ICD-10-CM

## 2017-03-02 DIAGNOSIS — Z87891 Personal history of nicotine dependence: Secondary | ICD-10-CM

## 2017-03-02 DIAGNOSIS — Z794 Long term (current) use of insulin: Secondary | ICD-10-CM

## 2017-03-02 DIAGNOSIS — Z7982 Long term (current) use of aspirin: Secondary | ICD-10-CM

## 2017-03-02 MED ORDER — LIDOCAINE-PRILOCAINE 2.5-2.5 % EX CREA: 1.0000 "application " | TOPICAL_CREAM | CUTANEOUS | 0 refills | Status: AC | PRN

## 2017-03-02 MED ORDER — IMATINIB MESYLATE 400 MG PO TABS: 400.0000 mg | ORAL_TABLET | Freq: Every day | ORAL | 0 refills | Status: DC

## 2017-03-02 NOTE — Progress Notes (Signed)
[facility] day:  03/02/2017  Chief Complaint: Dwayne Richard is a 46 y.o. male with systemic mastocytosis who is seen for reassessment after interval hospitalization and new diagnosis of squamous cell carcinoma of the base of tongue.  HPI:  The patient was last seen in the hematology clinic on 09/23/2016 for initial assessment.  At that time, he denied any new complaints.  He denied any flares of severe urticaria.  Exam was stable.  CBC revealed a mild normocytic anemia.  Hematocrit was 35.3 with a hemoglobin of 11.4.  He did not make his appointment on 12/23/2016.  He has been followed by Dr Josue Hector in the endocrinology clinic.  He was seen on 01/07/2017.  At that time, he noted neck swelling and discomfort x 3 weeks.  He denied any recent cough, cold or flu-like symptoms. Ultrasound of the neck on 12/31/2016 revealed 2 left sided cystic nodules (1-1.78 cm) and 2 adjacent masses measuring 3.46 cm and 3.66 cm.  On 01/13/2017, he underwent ultrasound guided fine needle aspirate of a left neck node by Dr. Gabriel Carina.  Pathology revealed a monotonous lymphoid population.  He was admitted to [facility] from 02/13/2017 - 02/15/2017.  He presented to [facility] ER on 02/13/2017 with headache, low grade fever, sweating, neck swelling, and shortness of breath.    Neck CT on 02/13/2017 revealed bulky (left > right) cervical lymphadenopathy. The largest nodes were at the left level II station measuring up to 4.4 cm long axis (2.5-3 cm short axis).  There were smaller but abnormally rounded lymph nodes throughout the neck elsewhere.  There were no cystic or necrotic nodes.  There was increased for age but symmetric appearing soft tissue at the nasopharynx.  There was superimposed nonspecific appearing inflammatory process at the right level 1B nodal station, with associated overlying cellulitis.  There was no associated fluid collection.  Labs revealed a hematocrit of 32.2,  hemoglobin 10.5, MCV 92.6, platelets 281,000, WBC 8700 with an ANC of 5000.  Differential was unremarkable.  Creatinine was 0.81.  Uric acid was 5.2 (normal).  LDH was 232 (slightly elevated).  Monospot was positive.  He was started on Unasyn and Decadron (4 mg po q 6 hours).  He was discharged on 02/15/2017.  He underwent an excisional left neck node biopsy by Dr. Kathyrn Sheriff on 02/17/2017.  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  PET scan on 02/28/2017 revealed focal hypermetabolism in the left posterior oropharynx centered at the left base of tongue with associated asymmetric soft tissue fullness on the CT images, compatible with primary oropharyngeal carcinoma.  There was hypermetabolic left level II cervical nodal metastases. There was no hypermetabolic contralateral cervical nodal metastases or distant metastatic disease.   There was aortic atherosclerosis, small hiatalhernia, diffuse hepatic steatosis and mild colonic diverticulosis.  Symptomatically, he notes some difficulty swallowing.  He has had nausea.  He has been eating junk food.  He continues to have sweats.     Past Medical History:  Diagnosis Date  . COPD (chronic obstructive pulmonary disease) ([place])   . Depression   . Diabetes mellitus without complication ([place])   . GERD (gastroesophageal reflux disease)   . Hypertension   . Neuropathy    from knees down, "tingling" in hands  . S/P insertion of spinal cord stimulator    (x2)  . Sleep apnea   . Systemic mastocytosis    Per pt    Past Surgical History:  Procedure Laterality  Date  . cataract surgery  08/06/2014  . cataract surgery  11/05/2014  . MASS BIOPSY Left 02/17/2017   Procedure: NECK MASS BIOPSY;  Surgeon: Margaretha Sheffield, MD;  Location: [facility];  Service: ENT;  Laterality: Left;  NEED HARMONIC SCAPLE diabetic - insulin and oral meds sleep apnea  . PORTA CATH INSERTION N/A 02/22/2017   Procedure: Glori Luis Cath Insertion;   Surgeon: Algernon Huxley, MD;  Location: [facility];  Service: Cardiovascular;  Laterality: N/A;  . SEPTOPLASTY  12/15/2011  . Spinal implants  10/06/2011  . TYMPANOSTOMY TUBE PLACEMENT Right 11/03/2011  . X-STOP IMPLANTATION Right 07/25/2013    Family History  Problem Relation Age of Onset  . Diabetes Father   . Hypertension Father   . Diabetes Paternal Grandmother     Social History:  reports that he quit smoking about 2 years ago. He has never used smokeless tobacco. He reports that he does not drink alcohol or use drugs.  He and his wife moved from [place].  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  The patient is accompanied by his wife, Stanton Kidney, today.  Allergies: No Known Allergies  Current Medications: Current Outpatient Prescriptions  Medication Sig Dispense Refill  . amitriptyline (ELAVIL) 25 MG tablet Take 25 mg by mouth daily.    Marland Kitchen amLODipine (NORVASC) 5 MG tablet Take 5 mg by mouth daily.    Marland Kitchen aspirin 500 MG tablet Take 500 mg by mouth 2 (two) times daily.    Marland Kitchen atorvastatin (LIPITOR) 20 MG tablet Take 20 mg by mouth daily.     . Calcium Citrate 200 MG TABS Take 1,000 mg by mouth 2 (two) times daily.     . Cetirizine HCl 10 MG CAPS Take 10 mg by mouth 2 (two) times daily.     . cimetidine (TAGAMET) 400 MG tablet Take 400 mg by mouth 2 (two) times daily.     . cromolyn (GASTROCROM) 100 MG/5ML solution Take 200 mg by mouth 4 (four) times daily -  before meals and at bedtime.     . diphenhydrAMINE (BENADRYL) 50 MG capsule Take 50 mg by mouth 2 (two) times daily.     Marland Kitchen EPINEPHrine 0.3 mg/0.3 mL IJ SOAJ injection Inject 0.3 mg into the muscle once.     . fluticasone (FLONASE) 50 MCG/ACT nasal spray Place 2 sprays into both nostrils daily.     Marland Kitchen gabapentin (NEURONTIN) 600 MG tablet Take 1,200 mg by mouth 3 (three) times daily.     Marland Kitchen glimepiride (AMARYL) 4 MG tablet Take 4 mg by mouth 2 (two) times daily.     Marland Kitchen glucagon (GLUCAGON EMERGENCY) 1 MG injection Inject 1 mg into the muscle once  as needed.     . imatinib (GLEEVEC) 400 MG tablet Take 1 tablet (400 mg total) by mouth daily. 30 tablet 0  . Insulin Glargine (LANTUS [place]) Inject 90 Units into the skin daily.     . insulin regular (NOVOLIN R,HUMULIN R) 100 units/mL injection Inject 80 Units into the skin 3 (three) times daily as needed.     . Insulin Syringe-Needle U-100 (B-D INS SYR ULTRAFINE .3CC/30G) 30G X 1/2" 0.3 ML MISC Use 1 Syringe as directed.    . Lancets 28G MISC Use 1 each 3 (three) times daily. Use as instructed.    Marland Kitchen losartan (COZAAR) 100 MG tablet Take 100 mg by mouth daily.     . metFORMIN (GLUCOPHAGE) 1000 MG tablet Take 2,000 mg by mouth daily  with breakfast.     . Multiple Vitamin (MULTIVITAMIN) tablet Take 1 tablet by mouth daily.    Marland Kitchen omeprazole (PRILOSEC) 20 MG capsule Take 20 mg by mouth 2 (two) times daily before a meal.     . phenazopyridine (PYRIDIUM) 200 MG tablet Take 200 mg by mouth 2 (two) times daily.     . tamsulosin (FLOMAX) 0.4 MG CAPS capsule Take 0.4 mg by mouth daily.     Marland Kitchen tiZANidine (ZANAFLEX) 4 MG capsule Take 4 mg by mouth daily.     . traMADol (ULTRAM) 50 MG tablet Take 50 mg by mouth 3 (three) times daily.     Marland Kitchen zolpidem (AMBIEN CR) 12.5 MG CR tablet Take 12.5 mg by mouth at bedtime.     Marland Kitchen albuterol (PROVENTIL HFA;VENTOLIN HFA) 108 (90 Base) MCG/ACT inhaler Inhale 2 puffs into the lungs every 6 (six) hours as needed for wheezing.     . lidocaine-prilocaine (EMLA) cream Apply 1 application topically as needed. 30 g 0  . Melatonin 10 MG TABS Take 10 mg by mouth at bedtime.      No current facility-administered medications for this visit.     Review of Systems:  GENERAL:  Feels "the same".  No fevers or sweats.  Weight stable. PERFORMANCE STATUS (ECOG):  1 HEENT:  Difficulty swallowing.  No visual changes, runny nose, sore throat, mouth sores or tenderness. Lungs: Shortness of breath on exertion.  No cough.  No hemoptysis.  Sleep apnea. Cardiac:  No chest pain, palpitations,  orthopnea, or PND. GI:  Nausea and diarrhea secondary to mastocytosis.  No vomiting, constipation, melena or hematochezia.  Hemorrhoids. GU:  No urgency, frequency, dysuria, or hematuria. On Flomax. Musculoskeletal:  No back pain.  No joint pain.  No muscle tenderness. Extremities:  No pain or swelling. Skin:  No severe flares or urticaria. Neuro:  No headache, numbness or weakness, balance or coordination issues. Endocrine:  Diabetes.  No tthyroid issues, hot flashes or night sweats. Psych:  No mood changes, depression or anxiety. Pain:  No focal pain. Review of systems:  All other systems reviewed and found   Physical Exam: Blood pressure (!) 146/71, pulse (!) 114, temperature 98.4 F (36.9 C), temperature source Tympanic, resp. rate 18, weight (!) 373 lb 14.4 oz (169.6 kg). GENERAL:  Well developed, well nourished, overweight gentleman sitting comfortably in the exam room in no acute distress. MENTAL STATUS:  Alert and oriented to person, place and time. HEAD:  Short crewcut.  Normocephalic, atraumatic, face symmetric, no Cushingoid features. EYES:  Glasses.  Pupils equal round and reactive to light and accomodation.  No conjunctivitis or scleral icterus. ENT:  Oropharynx clear without lesion.  Tongue normal. Mucous membranes moist.  NECK:  Well healing left neck incision s/p lymph node removal.  Left neck adenopathy. RESPIRATORY:  Clear to auscultation without rales, wheezes or rhonchi. CARDIOVASCULAR:  Regular rate and rhythm without murmur, rub or gallop. ABDOMEN:  Soft, non-tender, with active bowel sounds, and no appreciable hepatosplenomegaly.  No masses. 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  EXTREMITIES: No edema, no skin discoloration or tenderness.  No palpable cords. LYMPH NODES: No palpable cervical, supraclavicular, axillary or inguinal adenopathy  NEUROLOGICAL: Unremarkable. PSYCH:  Appropriate.   Appointment on 03/02/2017  Component Date Value Ref Range Status   . [facility] test code 03/02/2017 [id number]   Final  . [facility] test name 03/02/2017 GALACTOSE ALPHA 1,3   Final  . Misc [facility] result 03/02/2017 COMMENT   Final  Comment: (NOTE) Performed At: [facility] [address] [place], [place] [id number] [place] PO:[id number]   Hospital Outpatient Visit on 02/28/2017  Component Date Value Ref Range Status  . Glucose-Capillary 02/28/2017 108* 65 - 99 mg/dL Final    Assessment:  Dwayne Richard is a 46 y.o. 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  Excisional left neck node biopsy on 02/17/2017 revealed metastatic squamous cell carcinoma, P16 positive.  Neck CT on 02/13/2017 revealed bulky (left > right) cervical lymphadenopathy. The largest nodes were at the left level II station measuring up to 4.4 cm long axis (2.5-3 cm short axis).  There were smaller but abnormally rounded lymph nodes throughout the neck elsewhere.  There were no cystic or necrotic nodes.  There was superimposed nonspecific appearing inflammatory process at the right level 1B nodal station, with associated overlying cellulitis.  There was no associated fluid collection.  Chest CT on 02/14/2017 revealed scattered upper normal to mildly enlarged lymph nodes in the mediastinum and upper abdomen come nonspecific for reactive versus neoplastic etiology. An index right hilar lymph node measures 1.1 cm in short axis.  PET scan on 02/28/2017 revealed focal hypermetabolism in the left posterior oropharynx centered at the left base of tongue with associated asymmetric soft tissue fullness on the CT images, compatible with primary oropharyngeal carcinoma.  There was hypermetabolic left level II cervical nodal metastases. There was no hypermetabolic contralateral cervical nodal metastases or distant metastatic disease.  Clinical stage is T2N1M0.  He has systemic mastocytosis.  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 Tryptase was 32.3 (2.2-13.2) on 07/29/2016.  He is on several medications: cetirizine (Zyrtec), cimetidine (Tagamet), cromolyn (Gastrocrom), and Gleevec (400 mg a day).  He uses an epinephrine pen 3-4 times a year.  He has a chronic gastrointestinal symptoms (nausea and diarrhea).   He was on a trial of interferon in early 2016 x 3 months.  Interferon was discontinued in 07/2015.  Interferon did not improve his symptoms.  He experienced uncontrolled diabetes and elevated liver function tests with interferon.  Bone marrow biopsy on 07/09/2009 at [facility] in [place], [place] demonstrated normal cellularity. There were small poorly defined areas of apparent spindle cells which did not stained positive for mast cells. CK mutation was negative. There were rare atypical cells expressing CD-117 and CD-25. The cells represented approximately 0.1% of the leukocytes.   Bone marrow biopsy on 10/10/2013 performed at [facility] in [place] revealed evidence of persistent systemic mastocytosis (5-10% of cellularity).  There was no increase in blast or overt dyspoiesis.  There was moderate reticulin fibrosis. Storage iron was present. There were no ring sideroblasts.  Flow cytometry revealed no phenotypic evidence of lymphoma or increased blasts.  Cytogenetics were normal (46, XY). There was no evidence of KIT (D816V) point mutation by PCR.   He has osteopenia on bone density study in 2014.  He is on calcium and vitamin D.  He has not had any fractures.  Symptomatically, he notes some difficulty swallowing.  He has had nausea.  He continues to have sweats.    Plan: 1.  Review imaging studies and pathology.  Patient presented at tumor board.  Discuss diagnosis of base of tongue squamous cell carcinoma.  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  Discuss planned treatment with cisplatin and radiation.  Discuss weekly cisplatin (40 mg/m2) versus standard  cisplatin (100 mg/m2 every 3 weeks  x 3).  Side effects of therapy reviewed in detail.  Discuss concern for every 3 week dose based on BSA.  Concern for renal dysfunction, hearing loss, and nausea/vomiting.  Discuss cetuximab with radiation.  Side effects reviewed.  Discussed potential allergic reaction and testing.  Discuss f/u with [facility] program.  We discussed the plan for weekly cisplatin with radiation.  Patient not interested in surgery.  The patient and his wife are more comfortable with this plan.   2.  Labs today: galactose 1, 3-alpha galastose. 3.  RTC on 04/16 or 03/08/2017 in [place] for MD assessment, labs (CBC with diff, CMP, Mg), and cisplatin.  Addendum:  Spoke with physician from [facility] program.  Agree that standard of care (cisplatin 100 mg/m2 every 3 weeks x 3) would be toxic given his BSA.  Discussed weekly cisplatin 40 mg/m2 with radiation.  [facility] program gives 20 mg/m2 daily x 5 (100 mg/m2 over 1 week) with radiation on weeks 1 and 5.  Total cisplatin dose 200 mg/m2.   Lequita Asal, MD  03/02/2017

## 2017-03-03 LAB — MISC LABCORP TEST (SEND OUT): Labcorp test code: 806562

## 2017-03-03 LAB — HIV ANTIBODY (ROUTINE TESTING W REFLEX): HIV SCREEN 4TH GENERATION: NONREACTIVE

## 2017-03-03 LAB — TRYPTASE: TRYPTASE: 28.4 ug/L — AB (ref 2.2–13.2)

## 2017-03-04 ENCOUNTER — Ambulatory Visit: Payer: BLUE CROSS/BLUE SHIELD | Admitting: Hematology and Oncology

## 2017-03-04 DIAGNOSIS — Z510 Encounter for antineoplastic radiation therapy: Secondary | ICD-10-CM | POA: Diagnosis not present

## 2017-03-07 ENCOUNTER — Ambulatory Visit
Admission: RE | Admit: 2017-03-07 | Discharge: 2017-03-07 | Disposition: A | Discharge status: Home / Self Care | Payer: BLUE CROSS/BLUE SHIELD | Source: Ambulatory Visit | Attending: Radiation Oncology | Admitting: Radiation Oncology

## 2017-03-07 ENCOUNTER — Other Ambulatory Visit: Payer: BLUE CROSS/BLUE SHIELD

## 2017-03-07 ENCOUNTER — Ambulatory Visit: Payer: BLUE CROSS/BLUE SHIELD | Admitting: Hematology and Oncology

## 2017-03-08 ENCOUNTER — Inpatient Hospital Stay: Payer: BLUE CROSS/BLUE SHIELD

## 2017-03-08 ENCOUNTER — Ambulatory Visit
Admission: RE | Admit: 2017-03-08 | Discharge: 2017-03-08 | Disposition: A | Discharge status: Home / Self Care | Payer: BLUE CROSS/BLUE SHIELD | Source: Ambulatory Visit | Attending: Radiation Oncology | Admitting: Radiation Oncology

## 2017-03-08 ENCOUNTER — Inpatient Hospital Stay (HOSPITAL_BASED_OUTPATIENT_CLINIC_OR_DEPARTMENT_OTHER): Payer: BLUE CROSS/BLUE SHIELD | Admitting: Hematology and Oncology

## 2017-03-08 ENCOUNTER — Other Ambulatory Visit: Payer: Self-pay | Admitting: Hematology and Oncology

## 2017-03-08 VITALS — BP 135/78 | HR 109 | Temp 97.9°F | Resp 18 | Wt 368.0 lb

## 2017-03-08 DIAGNOSIS — K219 Gastro-esophageal reflux disease without esophagitis: Secondary | ICD-10-CM

## 2017-03-08 DIAGNOSIS — F329 Major depressive disorder, single episode, unspecified: Secondary | ICD-10-CM

## 2017-03-08 DIAGNOSIS — I10 Essential (primary) hypertension: Secondary | ICD-10-CM | POA: Diagnosis not present

## 2017-03-08 DIAGNOSIS — Z95828 Presence of other vascular implants and grafts: Secondary | ICD-10-CM

## 2017-03-08 DIAGNOSIS — C01 Malignant neoplasm of base of tongue: Secondary | ICD-10-CM

## 2017-03-08 DIAGNOSIS — G4730 Sleep apnea, unspecified: Secondary | ICD-10-CM

## 2017-03-08 DIAGNOSIS — Z794 Long term (current) use of insulin: Secondary | ICD-10-CM

## 2017-03-08 DIAGNOSIS — R1310 Dysphagia, unspecified: Secondary | ICD-10-CM

## 2017-03-08 DIAGNOSIS — Z79899 Other long term (current) drug therapy: Secondary | ICD-10-CM

## 2017-03-08 DIAGNOSIS — J449 Chronic obstructive pulmonary disease, unspecified: Secondary | ICD-10-CM

## 2017-03-08 DIAGNOSIS — K8020 Calculus of gallbladder without cholecystitis without obstruction: Secondary | ICD-10-CM

## 2017-03-08 DIAGNOSIS — G629 Polyneuropathy, unspecified: Secondary | ICD-10-CM

## 2017-03-08 DIAGNOSIS — E119 Type 2 diabetes mellitus without complications: Secondary | ICD-10-CM

## 2017-03-08 DIAGNOSIS — D4702 Systemic mastocytosis: Secondary | ICD-10-CM | POA: Diagnosis not present

## 2017-03-08 DIAGNOSIS — Z510 Encounter for antineoplastic radiation therapy: Secondary | ICD-10-CM | POA: Diagnosis not present

## 2017-03-08 DIAGNOSIS — Z87891 Personal history of nicotine dependence: Secondary | ICD-10-CM

## 2017-03-08 DIAGNOSIS — D649 Anemia, unspecified: Secondary | ICD-10-CM | POA: Diagnosis not present

## 2017-03-08 DIAGNOSIS — Z7982 Long term (current) use of aspirin: Secondary | ICD-10-CM

## 2017-03-08 DIAGNOSIS — I2510 Atherosclerotic heart disease of native coronary artery without angina pectoris: Secondary | ICD-10-CM

## 2017-03-08 LAB — CBC WITH DIFFERENTIAL/PLATELET
Basophils Absolute: 0 10*3/uL (ref 0–0.1)
Basophils Relative: 0 %
Eosinophils Absolute: 0.3 10*3/uL (ref 0–0.7)
Eosinophils Relative: 4 %
HCT: 32.8 % — ABNORMAL LOW (ref 40.0–52.0)
Hemoglobin: 10.7 g/dL — ABNORMAL LOW (ref 13.0–18.0)
Lymphocytes Relative: 33 %
Lymphs Abs: 2.6 10*3/uL (ref 1.0–3.6)
MCH: 29.7 pg (ref 26.0–34.0)
MCHC: 32.6 g/dL (ref 32.0–36.0)
MCV: 91.2 fL (ref 80.0–100.0)
Monocytes Absolute: 0.6 10*3/uL (ref 0.2–1.0)
Monocytes Relative: 8 %
Neutro Abs: 4.4 10*3/uL (ref 1.4–6.5)
Neutrophils Relative %: 55 %
Platelets: 248 10*3/uL (ref 150–440)
RBC: 3.6 MIL/uL — ABNORMAL LOW (ref 4.40–5.90)
RDW: 16 % — ABNORMAL HIGH (ref 11.5–14.5)
WBC: 7.9 10*3/uL (ref 3.8–10.6)

## 2017-03-08 LAB — COMPREHENSIVE METABOLIC PANEL
ALT: 72 U/L — ABNORMAL HIGH (ref 17–63)
AST: 58 U/L — ABNORMAL HIGH (ref 15–41)
Albumin: 4.2 g/dL (ref 3.5–5.0)
Alkaline Phosphatase: 82 U/L (ref 38–126)
Anion gap: 8 (ref 5–15)
BUN: 8 mg/dL (ref 6–20)
CO2: 25 mmol/L (ref 22–32)
Calcium: 8.9 mg/dL (ref 8.9–10.3)
Chloride: 102 mmol/L (ref 101–111)
Creatinine, Ser: 0.73 mg/dL (ref 0.61–1.24)
GFR calc Af Amer: >60 mL/min (ref >60)
GFR calc non Af Amer: >60 mL/min (ref >60)
Glucose, Bld: 201 mg/dL — ABNORMAL HIGH (ref 65–99)
Potassium: 3.7 mmol/L (ref 3.5–5.1)
Sodium: 135 mmol/L (ref 135–145)
Total Bilirubin: 0.5 mg/dL (ref 0.3–1.2)
Total Protein: 7.6 g/dL (ref 6.5–8.1)

## 2017-03-08 LAB — MAGNESIUM: Magnesium: 1.6 mg/dL — ABNORMAL LOW (ref 1.7–2.4)

## 2017-03-08 MED ORDER — EPINEPHRINE 0.3 MG/0.3ML IJ SOAJ: 0.3000 mg | Freq: Once | INTRAMUSCULAR | 2 refills | Status: AC

## 2017-03-08 MED ORDER — SODIUM CHLORIDE 0.9% FLUSH
10.0000 mL | INTRAVENOUS | Status: DC | PRN
  Administered 2017-03-08: 10 mL via INTRAVENOUS
  Filled 2017-03-08: qty 10

## 2017-03-08 MED ORDER — HEPARIN SOD (PORK) LOCK FLUSH 100 UNIT/ML IV SOLN
500.0000 [IU] | Freq: Once | INTRAVENOUS | Status: AC
  Administered 2017-03-08: 500 [IU] via INTRAVENOUS

## 2017-03-08 MED ORDER — LORAZEPAM 0.5 MG PO TABS
0.5000 mg | ORAL_TABLET | Freq: Four times a day (QID) | ORAL | 0 refills | Status: DC | PRN
Start: 2017-03-08 — End: 2017-04-22

## 2017-03-08 MED ORDER — EPINEPHRINE 0.3 MG/0.3ML IJ SOAJ: 0.3000 mg | Freq: Once | INTRAMUSCULAR | 2 refills | Status: DC

## 2017-03-08 MED ORDER — DEXAMETHASONE 4 MG PO TABS: ORAL_TABLET | ORAL | 1 refills | Status: DC

## 2017-03-08 MED ORDER — HEPARIN SOD (PORK) LOCK FLUSH 100 UNIT/ML IV SOLN
INTRAVENOUS | Status: AC
  Filled 2017-03-08: qty 5

## 2017-03-08 MED ORDER — ONDANSETRON HCL 8 MG PO TABS: 8.0000 mg | ORAL_TABLET | Freq: Two times a day (BID) | ORAL | 1 refills | Status: DC | PRN

## 2017-03-08 NOTE — Progress Notes (Signed)
Patient on plan of care prior to pathways. 

## 2017-03-08 NOTE — Progress Notes (Signed)
Patient requesting new prescription for EPI pen.   Offers no complaints today.

## 2017-03-08 NOTE — Progress Notes (Signed)
START ON PATHWAY REGIMEN - Head and Neck     Administer weekly:     Cisplatin   **Always confirm dose/schedule in your pharmacy ordering system**    Patient Characteristics: Oropharynx, HPV Positive, Clinically Staged, T1-4, cN1-3 or T3-4, cN0 Disease Classification: Oropharynx HPV Status: Positive (+) AJCC N Category: cN1 AJCC 8 Stage Grouping: I Current Disease Status: No Distant Metastases and No Recurrent Disease AJCC T Category: T2 AJCC M Category: M0 Intent of Therapy: Curative Intent, Discussed with Patient 

## 2017-03-08 NOTE — Progress Notes (Signed)
[facility] day:  03/08/2017  Chief Complaint: Zyrell Carmean is a 46 y.o. male with systemic mastocytosis who is seen for assessment prior to initiation of weekly cisplatin with concurrent radiation.  HPI:  The patient was last seen in the medical oncology clinic on 03/02/2017.  At that time, he was seen for assessment after interval hospitalization.  We discussed his pathology and imaging imaging studies. We discussed standard of therapy with cisplatin every 3 weeks 3 with radiation versus weekly cisplatin. Side effects were reviewed in detail.  Concern was raised regarding large dose of cisplatin every 3 weeks based on his body surface area.  Issues with nausea, vomiting, renal insufficiency and hearing loss were discussed.  [facility] physicians noted their preference of daily cisplatin 20 mg/m 5 days every 5 weeks 2 (in-house protocol; variant from NCCN guidelines).  Total cisplatin dose was 200 mg/m. Cisplatin weekly 40 mg/m was also felt to be a good option.  He underwent audiogram with ENT (results unavailable).  He denies any new complaints.   Past Medical History:  Diagnosis Date  . COPD (chronic obstructive pulmonary disease) ([place])   . Depression   . Diabetes mellitus without complication ([place])   . GERD (gastroesophageal reflux disease)   . Hypertension   . Neuropathy    from knees down, "tingling" in hands  . S/P insertion of spinal cord stimulator    (x2)  . Sleep apnea   . Systemic mastocytosis    Per pt    Past Surgical History:  Procedure Laterality Date  . cataract surgery  08/06/2014  . cataract surgery  11/05/2014  . MASS BIOPSY Left 02/17/2017   Procedure: NECK MASS BIOPSY;  Surgeon: Margaretha Sheffield, MD;  Location: [facility];  Service: ENT;  Laterality: Left;  NEED HARMONIC SCAPLE diabetic - insulin and oral meds sleep apnea  . PORTA CATH INSERTION N/A 02/22/2017   Procedure: Glori Luis Cath Insertion;   Surgeon: Algernon Huxley, MD;  Location: [facility];  Service: Cardiovascular;  Laterality: N/A;  . SEPTOPLASTY  12/15/2011  . Spinal implants  10/06/2011  . TYMPANOSTOMY TUBE PLACEMENT Right 11/03/2011  . X-STOP IMPLANTATION Right 07/25/2013    Family History  Problem Relation Age of Onset  . Diabetes Father   . Hypertension Father   . Diabetes Paternal Grandmother     Social History:  reports that he quit smoking about 2 years ago. He has never used smokeless tobacco. He reports that he does not drink alcohol or use drugs.  He and his wife moved from [place].  He lives in [place].  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  Allergies: No Known Allergies  Current Medications: Current Outpatient Prescriptions  Medication Sig Dispense Refill  . albuterol (PROVENTIL HFA;VENTOLIN HFA) 108 (90 Base) MCG/ACT inhaler Inhale 2 puffs into the lungs every 6 (six) hours as needed for wheezing.     Marland Kitchen amitriptyline (ELAVIL) 25 MG tablet Take 25 mg by mouth daily.    Marland Kitchen amLODipine (NORVASC) 5 MG tablet Take 5 mg by mouth daily.    Marland Kitchen aspirin 500 MG tablet Take 500 mg by mouth 2 (two) times daily.    Marland Kitchen atorvastatin (LIPITOR) 20 MG tablet Take 20 mg by mouth daily.     . Calcium Citrate 200 MG TABS Take 1,000 mg by mouth 2 (two) times daily.     . Cetirizine HCl 10 MG CAPS Take 10 mg by mouth  2 (two) times daily.     . cimetidine (TAGAMET) 400 MG tablet Take 400 mg by mouth 2 (two) times daily.     . cromolyn (GASTROCROM) 100 MG/5ML solution Take 200 mg by mouth 4 (four) times daily -  before meals and at bedtime.     Marland Kitchen dexamethasone (DECADRON) 4 MG tablet Take 2 tablets by mouth once a day on the day after chemotherapy and then take 2 tablets two times a day for 2 days. Take with food. 30 tablet 1  . diphenhydrAMINE (BENADRYL) 50 MG capsule Take 50 mg by mouth 2 (two) times daily.     Marland Kitchen EPINEPHrine 0.3 mg/0.3 mL IJ SOAJ injection Inject 0.3 mg into the muscle once.     . fluticasone (FLONASE) 50 MCG/ACT nasal  spray Place 2 sprays into both nostrils daily.     Marland Kitchen gabapentin (NEURONTIN) 600 MG tablet Take 1,200 mg by mouth 3 (three) times daily.     Marland Kitchen glimepiride (AMARYL) 4 MG tablet Take 4 mg by mouth 2 (two) times daily.     Marland Kitchen glucagon (GLUCAGON EMERGENCY) 1 MG injection Inject 1 mg into the muscle once as needed.     . imatinib (GLEEVEC) 400 MG tablet Take 1 tablet (400 mg total) by mouth daily. 30 tablet 0  . Insulin Glargine (LANTUS [place]) Inject 90 Units into the skin daily.     . insulin regular (NOVOLIN R,HUMULIN R) 100 units/mL injection Inject 80 Units into the skin 3 (three) times daily as needed.     . Insulin Syringe-Needle U-100 (B-D INS SYR ULTRAFINE .3CC/30G) 30G X 1/2" 0.3 ML MISC Use 1 Syringe as directed.    . Lancets 28G MISC Use 1 each 3 (three) times daily. Use as instructed.    . lidocaine-prilocaine (EMLA) cream Apply 1 application topically as needed. 30 g 0  . LORazepam (ATIVAN) 0.5 MG tablet Take 1 tablet (0.5 mg total) by mouth every 6 (six) hours as needed (Nausea or vomiting). 30 tablet 0  . losartan (COZAAR) 100 MG tablet Take 100 mg by mouth daily.     . Melatonin 10 MG TABS Take 10 mg by mouth at bedtime.     . metFORMIN (GLUCOPHAGE) 1000 MG tablet Take 2,000 mg by mouth daily with breakfast.     . Multiple Vitamin (MULTIVITAMIN) tablet Take 1 tablet by mouth daily.    Marland Kitchen omeprazole (PRILOSEC) 20 MG capsule Take 20 mg by mouth 2 (two) times daily before a meal.     . ondansetron (ZOFRAN) 8 MG tablet Take 1 tablet (8 mg total) by mouth 2 (two) times daily as needed. Start on the third day after chemotherapy. 30 tablet 1  . phenazopyridine (PYRIDIUM) 200 MG tablet Take 200 mg by mouth 2 (two) times daily.     . tamsulosin (FLOMAX) 0.4 MG CAPS capsule Take 0.4 mg by mouth daily.     Marland Kitchen tiZANidine (ZANAFLEX) 4 MG capsule Take 4 mg by mouth daily.     . traMADol (ULTRAM) 50 MG tablet Take 50 mg by mouth 3 (three) times daily.     Marland Kitchen zolpidem (AMBIEN CR) 12.5 MG CR tablet Take 12.5  mg by mouth at bedtime.     . sucralfate (CARAFATE) 1 g tablet Take 1 tablet (1 g total) by mouth 3 (three) times daily. Dissolve in 2-3 tbsp warm water, swish and swallow. 90 tablet 3   No current facility-administered medications for this visit.     Review of  Systems:  GENERAL:  Feels "the same".  No fevers or sweats.  Weight up 5 pounds. PERFORMANCE STATUS (ECOG):  1 HEENT:  No visual changes, runny nose, sore throat, mouth sores or tenderness. Lungs: Shortness of breath on exertion.  No cough.  No hemoptysis.  Sleep apnea. Cardiac:  No chest pain, palpitations, orthopnea, or PND. GI:  Nausea and diarrhea secondary to mastocytosis.  No vomiting, constipation, melena or hematochezia.  Hemorrhoids. GU:  No urgency, frequency, dysuria, or hematuria. On Flomax. Musculoskeletal:  No back pain.  No joint pain.  No muscle tenderness. Extremities:  No pain or swelling. Skin:  No severe flares or urticaria.  Requests epi pen. Neuro:  No headache, numbness or weakness, balance or coordination issues. Endocrine:  Diabetes.  No tthyroid issues, hot flashes or night sweats. Psych:  No mood changes, depression or anxiety. Pain:  No focal pain. Review of systems:  All other systems reviewed and found   Physical Exam: Blood pressure 135/78, pulse (!) 109, temperature 97.9 F (36.6 C), temperature source Tympanic, resp. rate 18, weight (!) 368 lb (166.9 kg). GENERAL:  Well developed, well nourished, overweight gentleman sitting comfortably in the exam room in no acute distress. MENTAL STATUS:  Alert and oriented to person, place and time. HEAD:  Short crewcut.  Normocephalic, atraumatic, face symmetric, no Cushingoid features. EYES:  Glasses.  Pupils equal round and reactive to light and accomodation.  No conjunctivitis or scleral icterus. ENT:  Oropharynx clear without lesion.  Tongue normal. Mucous membranes moist.  NECK:  Left neck adenopathy. RESPIRATORY:  Clear to auscultation without rales,  wheezes or rhonchi. CARDIOVASCULAR:  Regular rate and rhythm without murmur, rub or gallop. ABDOMEN:  Soft, non-tender, with active bowel sounds, and no appreciable hepatosplenomegaly.  No masses. 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    EXTREMITIES: No edema, no skin discoloration or tenderness.  No palpable cords. LYMPH NODES:  Conglomerate left cervical adenopathy (3-4 cm).  No palpable supraclavicular, axillary or inguinal adenopathy  NEUROLOGICAL: Unremarkable. PSYCH:  Appropriate.   Appointment on 03/08/2017  Component Date Value Ref Range Status  . WBC 03/08/2017 7.9  3.8 - 10.6 K/uL Final  . RBC 03/08/2017 3.60* 4.40 - 5.90 MIL/uL Final  . Hemoglobin 03/08/2017 10.7* 13.0 - 18.0 g/dL Final  . HCT 03/08/2017 32.8* 40.0 - 52.0 % Final  . MCV 03/08/2017 91.2  80.0 - 100.0 fL Final  . MCH 03/08/2017 29.7  26.0 - 34.0 pg Final  . MCHC 03/08/2017 32.6  32.0 - 36.0 g/dL Final  . RDW 03/08/2017 16.0* 11.5 - 14.5 % Final  . Platelets 03/08/2017 248  150 - 440 K/uL Final  . Neutrophils Relative % 03/08/2017 55  % Final  . Neutro Abs 03/08/2017 4.4  1.4 - 6.5 K/uL Final  . Lymphocytes Relative 03/08/2017 33  % Final  . Lymphs Abs 03/08/2017 2.6  1.0 - 3.6 K/uL Final  . Monocytes Relative 03/08/2017 8  % Final  . Monocytes Absolute 03/08/2017 0.6  0.2 - 1.0 K/uL Final  . Eosinophils Relative 03/08/2017 4  % Final  . Eosinophils Absolute 03/08/2017 0.3  0 - 0.7 K/uL Final  . Basophils Relative 03/08/2017 0  % Final  . Basophils Absolute 03/08/2017 0.0  0 - 0.1 K/uL Final  . Sodium 03/08/2017 135  135 - 145 mmol/L Final  . Potassium 03/08/2017 3.7  3.5 - 5.1 mmol/L Final  . Chloride 03/08/2017 102  101 - 111 mmol/L Final  . CO2 03/08/2017 25  22 - 32 mmol/L Final  . Glucose, Bld 03/08/2017 201* 65 - 99 mg/dL Final  . BUN 03/08/2017 8  6 - 20 mg/dL Final  . Creatinine, Ser 03/08/2017 0.73  0.61 - 1.24 mg/dL Final  . Calcium 03/08/2017 8.9  8.9 - 10.3 mg/dL Final  . Total Protein  03/08/2017 7.6  6.5 - 8.1 g/dL Final  . Albumin 03/08/2017 4.2  3.5 - 5.0 g/dL Final  . AST 03/08/2017 58* 15 - 41 U/L Final  . ALT 03/08/2017 72* 17 - 63 U/L Final  . Alkaline Phosphatase 03/08/2017 82  38 - 126 U/L Final  . Total Bilirubin 03/08/2017 0.5  0.3 - 1.2 mg/dL Final  . GFR calc non Af Amer 03/08/2017 >60  >60 mL/min Final  . GFR calc Af Amer 03/08/2017 >60  >60 mL/min Final   Comment: (NOTE) The eGFR has been calculated using the CKD EPI equation. This calculation has not been validated in all clinical situations. eGFR's persistently <60 mL/min signify possible Chronic Kidney Disease.   . Anion gap 03/08/2017 8  5 - 15 Final  . Magnesium 03/08/2017 1.6* 1.7 - 2.4 mg/dL Final    Assessment:  Jasiah Elsen is a 46 y.o. 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  Excisional left neck node biopsy on 02/17/2017 revealed metastatic squamous cell carcinoma, P16 positive.  HPV in situ hybridization was positive (16/18).  EBV ISH was negative.  Galactose alpha 1,3 was negative.  Neck CT on 02/13/2017 revealed bulky (left > right) cervical lymphadenopathy. The largest nodes were at the left level II station measuring up to 4.4 cm long axis (2.5-3 cm short axis).  There were smaller but abnormally rounded lymph nodes throughout the neck elsewhere.  There were no cystic or necrotic nodes.  There was superimposed nonspecific appearing inflammatory process at the right level 1B nodal station, with associated overlying cellulitis.  There was no associated fluid collection.  Chest CT on 02/14/2017 revealed scattered upper normal to mildly enlarged lymph nodes in the mediastinum and upper abdomen come nonspecific for reactive versus neoplastic etiology. An index right hilar lymph node measures 1.1 cm in short axis.  PET scan on 02/28/2017 revealed focal hypermetabolism in the left posterior oropharynx centered at the left base of tongue with  associated asymmetric soft tissue fullness on the CT images, compatible with primary oropharyngeal carcinoma.  There was hypermetabolic left level II cervical nodal metastases. There was no hypermetabolic contralateral cervical nodal metastases or distant metastatic disease.  Clinical stage is T2N1M0.  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 Tryptase was 32.3 (2.2-13.2) on 07/29/2016.  He is on several medications: cetirizine (Zyrtec), cimetidine (Tagamet), cromolyn (Gastrocrom), and Gleevec (400 mg a day).  He uses an epinephrine pen 3-4 times a year.  He was on a trial of interferon in early 2016 x 3 months.  Interferon was discontinued in 07/2015.  Interferon did not improve his symptoms.  He experienced uncontrolled diabetes and elevated liver function tests with interferon.  Bone marrow biopsy on 07/09/2009 at [facility] in [place], [place] demonstrated normal cellularity. There were small poorly defined areas of apparent spindle cells which did not stained positive for mast cells. CK mutation was negative. There were rare atypical cells expressing CD-117 and CD-25. The cells represented approximately 0.1% of the leukocytes.   Bone marrow biopsy on 10/10/2013 performed at [facility] in [place] revealed  evidence of persistent systemic mastocytosis (5-10% of cellularity).  There was no increase in blast or overt dyspoiesis.  There was moderate reticulin fibrosis. Storage iron was present. There were no ring sideroblasts.  Flow cytometry revealed no phenotypic evidence of lymphoma or increased blasts.  Cytogenetics were normal (46, XY). There was no evidence of KIT (D816V) point mutation by PCR.   Neck CT on 02/13/2017 revealed bulky (left > right) cervical lymphadenopathy. The largest nodes were at the left level II station measuring up to 4.4 cm long axis (2.5-3 cm short axis).  There were smaller but  abnormally rounded lymph nodes throughout the neck elsewhere.  There were no cystic or necrotic nodes.  There was superimposed nonspecific appearing inflammatory process at the right level 1B nodal station, with associated overlying cellulitis.  There was no associated fluid collection.  Chest CT on 02/14/2017 revealed scattered upper normal to mildly enlarged lymph nodes in the mediastinum and upper abdomen come nonspecific for reactive versus neoplastic etiology. An index right hilar lymph node measures 1.1 cm in short axis.  He has a chronic gastrointestinal symptoms (nausea and diarrhea). He has osteopenia on bone density study in 2014.  He is on calcium and vitamin D.  He began radiation on 03/08/2017.  Symptomatically, he has sweats.  Exam reveals left sided cervical adenopathy.  Plan: 1.  Labs today:  CBC with diff, CMP, Mg. 2.  Discuss conversation with [facility] physicians.  Discuss plan for weekly cisplatin.  Side effects reviewed in detail.  Discuss importance of good hydration.  Discuss management of nausea.  Patient consented to treatment. 3.  Week #1 cisplatin today. 4.  Rx: ondansetron, Ativan, Decadron. 5.  Obtain copy of audiogram. 6.  RTC weekly x 5 for MD assessment, labs (CBC with diff, CMP, Mg) and cisplatin.  Addendum:  Chemotherapy initiation was delayed today secondary to insurance approval.  He was rescheduled to 03/11/2017.   Lequita Asal, MD  03/08/2017

## 2017-03-09 ENCOUNTER — Encounter: Payer: Self-pay | Admitting: Hematology and Oncology

## 2017-03-09 ENCOUNTER — Ambulatory Visit
Admission: RE | Admit: 2017-03-09 | Discharge: 2017-03-09 | Disposition: A | Discharge status: Home / Self Care | Payer: BLUE CROSS/BLUE SHIELD | Source: Ambulatory Visit | Attending: Radiation Oncology | Admitting: Radiation Oncology

## 2017-03-09 DIAGNOSIS — Z510 Encounter for antineoplastic radiation therapy: Secondary | ICD-10-CM | POA: Diagnosis not present

## 2017-03-09 LAB — SURGICAL PATHOLOGY

## 2017-03-10 ENCOUNTER — Other Ambulatory Visit: Payer: Self-pay | Admitting: Hematology and Oncology

## 2017-03-10 ENCOUNTER — Ambulatory Visit
Admission: RE | Admit: 2017-03-10 | Discharge: 2017-03-10 | Disposition: A | Discharge status: Home / Self Care | Payer: BLUE CROSS/BLUE SHIELD | Source: Ambulatory Visit | Attending: Radiation Oncology | Admitting: Radiation Oncology

## 2017-03-10 ENCOUNTER — Other Ambulatory Visit: Payer: Self-pay | Admitting: *Deleted

## 2017-03-10 DIAGNOSIS — C01 Malignant neoplasm of base of tongue: Secondary | ICD-10-CM

## 2017-03-10 DIAGNOSIS — Z510 Encounter for antineoplastic radiation therapy: Secondary | ICD-10-CM | POA: Diagnosis not present

## 2017-03-10 DIAGNOSIS — R591 Generalized enlarged lymph nodes: Secondary | ICD-10-CM

## 2017-03-11 ENCOUNTER — Ambulatory Visit
Admission: RE | Admit: 2017-03-11 | Discharge: 2017-03-11 | Disposition: A | Discharge status: Home / Self Care | Payer: BLUE CROSS/BLUE SHIELD | Source: Ambulatory Visit | Attending: Radiation Oncology | Admitting: Radiation Oncology

## 2017-03-11 ENCOUNTER — Ambulatory Visit: Payer: BLUE CROSS/BLUE SHIELD | Admitting: Hematology and Oncology

## 2017-03-11 ENCOUNTER — Inpatient Hospital Stay: Payer: BLUE CROSS/BLUE SHIELD

## 2017-03-11 ENCOUNTER — Other Ambulatory Visit: Payer: BLUE CROSS/BLUE SHIELD

## 2017-03-11 VITALS — BP 135/84 | HR 96 | Temp 98.4°F | Resp 20

## 2017-03-11 DIAGNOSIS — Z510 Encounter for antineoplastic radiation therapy: Secondary | ICD-10-CM | POA: Diagnosis not present

## 2017-03-11 DIAGNOSIS — C01 Malignant neoplasm of base of tongue: Secondary | ICD-10-CM | POA: Diagnosis not present

## 2017-03-11 MED ORDER — HEPARIN SOD (PORK) LOCK FLUSH 100 UNIT/ML IV SOLN
500.0000 [IU] | Freq: Once | INTRAVENOUS | Status: AC | PRN
  Administered 2017-03-11: 500 [IU]
  Filled 2017-03-11: qty 5

## 2017-03-11 MED ORDER — POTASSIUM CHLORIDE 2 MEQ/ML IV SOLN
Freq: Once | INTRAVENOUS | Status: AC
  Administered 2017-03-11: 09:00:00 via INTRAVENOUS
  Filled 2017-03-11: qty 1000

## 2017-03-11 MED ORDER — SODIUM CHLORIDE 0.9% FLUSH
10.0000 mL | INTRAVENOUS | Status: DC | PRN
  Administered 2017-03-11: 10 mL
  Filled 2017-03-11: qty 10

## 2017-03-11 MED ORDER — SODIUM CHLORIDE 0.9 % IV SOLN
40.0000 mg/m2 | Freq: Once | INTRAVENOUS | Status: AC
  Administered 2017-03-11: 120 mg via INTRAVENOUS
  Filled 2017-03-11: qty 120

## 2017-03-11 MED ORDER — SODIUM CHLORIDE 0.9 % IV SOLN
Freq: Once | INTRAVENOUS | Status: AC
  Administered 2017-03-11: 09:00:00 via INTRAVENOUS
  Filled 2017-03-11: qty 1000

## 2017-03-11 MED ORDER — FOSAPREPITANT DIMEGLUMINE INJECTION 150 MG
Freq: Once | INTRAVENOUS | Status: AC
  Administered 2017-03-11: 11:00:00 via INTRAVENOUS
  Filled 2017-03-11: qty 5

## 2017-03-11 MED ORDER — PALONOSETRON HCL INJECTION 0.25 MG/5ML
0.2500 mg | Freq: Once | INTRAVENOUS | Status: AC
  Administered 2017-03-11: 0.25 mg via INTRAVENOUS
  Filled 2017-03-11: qty 5

## 2017-03-14 ENCOUNTER — Ambulatory Visit
Admission: RE | Admit: 2017-03-14 | Discharge: 2017-03-14 | Disposition: A | Discharge status: Home / Self Care | Payer: BLUE CROSS/BLUE SHIELD | Source: Ambulatory Visit | Attending: Radiation Oncology | Admitting: Radiation Oncology

## 2017-03-14 DIAGNOSIS — Z510 Encounter for antineoplastic radiation therapy: Secondary | ICD-10-CM | POA: Diagnosis not present

## 2017-03-15 ENCOUNTER — Other Ambulatory Visit: Payer: BLUE CROSS/BLUE SHIELD

## 2017-03-15 ENCOUNTER — Ambulatory Visit: Payer: BLUE CROSS/BLUE SHIELD | Admitting: Hematology and Oncology

## 2017-03-15 ENCOUNTER — Ambulatory Visit
Admission: RE | Admit: 2017-03-15 | Discharge: 2017-03-15 | Disposition: A | Discharge status: Home / Self Care | Payer: BLUE CROSS/BLUE SHIELD | Source: Ambulatory Visit | Attending: Radiation Oncology | Admitting: Radiation Oncology

## 2017-03-15 ENCOUNTER — Ambulatory Visit: Payer: BLUE CROSS/BLUE SHIELD

## 2017-03-15 DIAGNOSIS — Z510 Encounter for antineoplastic radiation therapy: Secondary | ICD-10-CM | POA: Diagnosis not present

## 2017-03-16 ENCOUNTER — Ambulatory Visit
Admission: RE | Admit: 2017-03-16 | Discharge: 2017-03-16 | Disposition: A | Discharge status: Home / Self Care | Payer: BLUE CROSS/BLUE SHIELD | Source: Ambulatory Visit | Attending: Radiation Oncology | Admitting: Radiation Oncology

## 2017-03-16 ENCOUNTER — Other Ambulatory Visit: Payer: Self-pay | Admitting: *Deleted

## 2017-03-16 DIAGNOSIS — Z510 Encounter for antineoplastic radiation therapy: Secondary | ICD-10-CM | POA: Diagnosis not present

## 2017-03-16 MED ORDER — SUCRALFATE 1 G PO TABS: 1.0000 g | ORAL_TABLET | Freq: Three times a day (TID) | ORAL | 3 refills | Status: DC

## 2017-03-17 ENCOUNTER — Ambulatory Visit
Admission: RE | Admit: 2017-03-17 | Discharge: 2017-03-17 | Disposition: A | Discharge status: Home / Self Care | Payer: BLUE CROSS/BLUE SHIELD | Source: Ambulatory Visit | Attending: Radiation Oncology | Admitting: Radiation Oncology

## 2017-03-17 DIAGNOSIS — Z510 Encounter for antineoplastic radiation therapy: Secondary | ICD-10-CM | POA: Diagnosis not present

## 2017-03-18 ENCOUNTER — Ambulatory Visit
Admission: RE | Admit: 2017-03-18 | Discharge: 2017-03-18 | Disposition: A | Discharge status: Home / Self Care | Payer: BLUE CROSS/BLUE SHIELD | Source: Ambulatory Visit | Attending: Radiation Oncology | Admitting: Radiation Oncology

## 2017-03-18 ENCOUNTER — Inpatient Hospital Stay (HOSPITAL_BASED_OUTPATIENT_CLINIC_OR_DEPARTMENT_OTHER): Payer: BLUE CROSS/BLUE SHIELD | Admitting: Hematology and Oncology

## 2017-03-18 ENCOUNTER — Inpatient Hospital Stay: Payer: BLUE CROSS/BLUE SHIELD

## 2017-03-18 VITALS — BP 146/90 | HR 96 | Temp 99.0°F | Wt 367.6 lb

## 2017-03-18 DIAGNOSIS — Z5111 Encounter for antineoplastic chemotherapy: Secondary | ICD-10-CM

## 2017-03-18 DIAGNOSIS — Z79899 Other long term (current) drug therapy: Secondary | ICD-10-CM

## 2017-03-18 DIAGNOSIS — C01 Malignant neoplasm of base of tongue: Secondary | ICD-10-CM

## 2017-03-18 DIAGNOSIS — R591 Generalized enlarged lymph nodes: Secondary | ICD-10-CM

## 2017-03-18 DIAGNOSIS — H9202 Otalgia, left ear: Secondary | ICD-10-CM | POA: Diagnosis not present

## 2017-03-18 DIAGNOSIS — Z87891 Personal history of nicotine dependence: Secondary | ICD-10-CM

## 2017-03-18 DIAGNOSIS — G629 Polyneuropathy, unspecified: Secondary | ICD-10-CM

## 2017-03-18 DIAGNOSIS — J029 Acute pharyngitis, unspecified: Secondary | ICD-10-CM

## 2017-03-18 DIAGNOSIS — Z7982 Long term (current) use of aspirin: Secondary | ICD-10-CM

## 2017-03-18 DIAGNOSIS — G4730 Sleep apnea, unspecified: Secondary | ICD-10-CM

## 2017-03-18 DIAGNOSIS — D649 Anemia, unspecified: Secondary | ICD-10-CM

## 2017-03-18 DIAGNOSIS — Z510 Encounter for antineoplastic radiation therapy: Secondary | ICD-10-CM | POA: Diagnosis not present

## 2017-03-18 DIAGNOSIS — F329 Major depressive disorder, single episode, unspecified: Secondary | ICD-10-CM

## 2017-03-18 DIAGNOSIS — E119 Type 2 diabetes mellitus without complications: Secondary | ICD-10-CM

## 2017-03-18 DIAGNOSIS — R1310 Dysphagia, unspecified: Secondary | ICD-10-CM

## 2017-03-18 DIAGNOSIS — K5900 Constipation, unspecified: Secondary | ICD-10-CM

## 2017-03-18 DIAGNOSIS — K219 Gastro-esophageal reflux disease without esophagitis: Secondary | ICD-10-CM

## 2017-03-18 DIAGNOSIS — D4702 Systemic mastocytosis: Secondary | ICD-10-CM

## 2017-03-18 DIAGNOSIS — I10 Essential (primary) hypertension: Secondary | ICD-10-CM

## 2017-03-18 DIAGNOSIS — J449 Chronic obstructive pulmonary disease, unspecified: Secondary | ICD-10-CM

## 2017-03-18 DIAGNOSIS — Z794 Long term (current) use of insulin: Secondary | ICD-10-CM

## 2017-03-18 DIAGNOSIS — I2510 Atherosclerotic heart disease of native coronary artery without angina pectoris: Secondary | ICD-10-CM | POA: Diagnosis not present

## 2017-03-18 DIAGNOSIS — K8020 Calculus of gallbladder without cholecystitis without obstruction: Secondary | ICD-10-CM

## 2017-03-18 LAB — FUNGUS CULTURE WITH STAIN

## 2017-03-18 LAB — CBC WITH DIFFERENTIAL/PLATELET
Basophils Absolute: 0.1 10*3/uL (ref 0–0.1)
Basophils Relative: 1 %
Eosinophils Absolute: 0.2 10*3/uL (ref 0–0.7)
Eosinophils Relative: 1 %
HCT: 32.5 % — ABNORMAL LOW (ref 40.0–52.0)
Hemoglobin: 10.7 g/dL — ABNORMAL LOW (ref 13.0–18.0)
Lymphocytes Relative: 19 %
Lymphs Abs: 2.2 10*3/uL (ref 1.0–3.6)
MCH: 30 pg (ref 26.0–34.0)
MCHC: 33 g/dL (ref 32.0–36.0)
MCV: 91.2 fL (ref 80.0–100.0)
Monocytes Absolute: 0.9 10*3/uL (ref 0.2–1.0)
Monocytes Relative: 8 %
Neutro Abs: 8.3 10*3/uL — ABNORMAL HIGH (ref 1.4–6.5)
Neutrophils Relative %: 71 %
Platelets: 288 10*3/uL (ref 150–440)
RBC: 3.57 MIL/uL — ABNORMAL LOW (ref 4.40–5.90)
RDW: 16.4 % — ABNORMAL HIGH (ref 11.5–14.5)
WBC: 11.7 10*3/uL — ABNORMAL HIGH (ref 3.8–10.6)

## 2017-03-18 LAB — COMPREHENSIVE METABOLIC PANEL
ALT: 90 U/L — ABNORMAL HIGH (ref 17–63)
AST: 53 U/L — ABNORMAL HIGH (ref 15–41)
Albumin: 3.7 g/dL (ref 3.5–5.0)
Alkaline Phosphatase: 69 U/L (ref 38–126)
Anion gap: 5 (ref 5–15)
BUN: 9 mg/dL (ref 6–20)
CO2: 29 mmol/L (ref 22–32)
Calcium: 8.7 mg/dL — ABNORMAL LOW (ref 8.9–10.3)
Chloride: 102 mmol/L (ref 101–111)
Creatinine, Ser: 0.69 mg/dL (ref 0.61–1.24)
GFR calc Af Amer: >60 mL/min (ref >60)
GFR calc non Af Amer: >60 mL/min (ref >60)
Glucose, Bld: 140 mg/dL — ABNORMAL HIGH (ref 65–99)
Potassium: 3.6 mmol/L (ref 3.5–5.1)
Sodium: 136 mmol/L (ref 135–145)
Total Bilirubin: 0.9 mg/dL (ref 0.3–1.2)
Total Protein: 6.6 g/dL (ref 6.5–8.1)

## 2017-03-18 LAB — FUNGAL ORGANISM REFLEX

## 2017-03-18 LAB — FUNGUS CULTURE RESULT

## 2017-03-18 LAB — MAGNESIUM: Magnesium: 1.7 mg/dL (ref 1.7–2.4)

## 2017-03-18 MED ORDER — POTASSIUM CHLORIDE 2 MEQ/ML IV SOLN
Freq: Once | INTRAVENOUS | Status: AC
  Administered 2017-03-18: 10:00:00 via INTRAVENOUS
  Filled 2017-03-18: qty 1000

## 2017-03-18 MED ORDER — SODIUM CHLORIDE 0.9% FLUSH
10.0000 mL | INTRAVENOUS | Status: DC | PRN
  Administered 2017-03-18: 10 mL via INTRAVENOUS
  Filled 2017-03-18: qty 10

## 2017-03-18 MED ORDER — SODIUM CHLORIDE 0.9 % IV SOLN
40.0000 mg/m2 | Freq: Once | INTRAVENOUS | Status: AC
  Administered 2017-03-18: 120 mg via INTRAVENOUS
  Filled 2017-03-18: qty 120

## 2017-03-18 MED ORDER — FOSAPREPITANT DIMEGLUMINE INJECTION 150 MG
Freq: Once | INTRAVENOUS | Status: AC
  Administered 2017-03-18: 12:00:00 via INTRAVENOUS
  Filled 2017-03-18: qty 5

## 2017-03-18 MED ORDER — SODIUM CHLORIDE 0.9 % IV SOLN
Freq: Once | INTRAVENOUS | Status: AC
  Administered 2017-03-18: 10:00:00 via INTRAVENOUS
  Filled 2017-03-18: qty 1000

## 2017-03-18 MED ORDER — HEPARIN SOD (PORK) LOCK FLUSH 100 UNIT/ML IV SOLN
500.0000 [IU] | Freq: Once | INTRAVENOUS | Status: AC
  Administered 2017-03-18: 500 [IU] via INTRAVENOUS
  Filled 2017-03-18: qty 5

## 2017-03-18 MED ORDER — PALONOSETRON HCL INJECTION 0.25 MG/5ML
0.2500 mg | Freq: Once | INTRAVENOUS | Status: AC
  Administered 2017-03-18: 0.25 mg via INTRAVENOUS
  Filled 2017-03-18: qty 5

## 2017-03-18 NOTE — Progress Notes (Signed)
[facility] day:  03/18/2017   Chief Complaint: Dwayne Richard is a 46 y.o. male with systemic mastocytosis who is seen for assessment prior to week #2 cisplatin with concurrent radiation.  HPI:  The patient was last seen in the medical oncology clinic on 04/172018.  At that time, we discussed plans for weekly cisplatin and radiation.  Treatment began on 03/11/2017.  During the interim, he has done well.  He has a slight sore throat.  He has left ear pain. He has had a history of chronic ear infections. He has a tympanostomy tube in his right ear.   He notes some constipation for which he took MiraLax yesterday. Last night he noted some swelling in his right cheek. He denies any respiratory symptoms. He denies any runny nose. He has a little bit of nausea. He notes no hearing changes.  He denies any fevers.   Past Medical History:  Diagnosis Date  . COPD (chronic obstructive pulmonary disease) ([place])   . Depression   . Diabetes mellitus without complication ([place])   . GERD (gastroesophageal reflux disease)   . Hypertension   . Neuropathy    from knees down, "tingling" in hands  . S/P insertion of spinal cord stimulator    (x2)  . Sleep apnea   . Systemic mastocytosis    Per pt    Past Surgical History:  Procedure Laterality Date  . cataract surgery  08/06/2014  . cataract surgery  11/05/2014  . MASS BIOPSY Left 02/17/2017   Procedure: NECK MASS BIOPSY;  Surgeon: Margaretha Sheffield, MD;  Location: [facility];  Service: ENT;  Laterality: Left;  NEED HARMONIC SCAPLE diabetic - insulin and oral meds sleep apnea  . PORTA CATH INSERTION N/A 02/22/2017   Procedure: Glori Luis Cath Insertion;  Surgeon: Algernon Huxley, MD;  Location: [facility];  Service: Cardiovascular;  Laterality: N/A;  . SEPTOPLASTY  12/15/2011  . Spinal implants  10/06/2011  . TYMPANOSTOMY TUBE PLACEMENT Right 11/03/2011  . X-STOP IMPLANTATION Right 07/25/2013     Family History  Problem Relation Age of Onset  . Diabetes Father   . Hypertension Father   . Diabetes Paternal Grandmother     Social History:  reports that he quit smoking about 2 years ago. He has never used smokeless tobacco. He reports that he does not drink alcohol or use drugs.  He and his wife moved from [place].  He lives in [place].  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  The patient is accompanied by his daughter, Eliezer Lofts.  Allergies: No Known Allergies  Current Medications: Current Outpatient Prescriptions  Medication Sig Dispense Refill  . amitriptyline (ELAVIL) 25 MG tablet Take 25 mg by mouth daily.    Marland Kitchen amLODipine (NORVASC) 5 MG tablet Take 5 mg by mouth daily.    Marland Kitchen aspirin 500 MG tablet Take 500 mg by mouth 2 (two) times daily.    Marland Kitchen atorvastatin (LIPITOR) 20 MG tablet Take 20 mg by mouth daily.     . Calcium Citrate 200 MG TABS Take 1,000 mg by mouth 2 (two) times daily.     . Cetirizine HCl 10 MG CAPS Take 10 mg by mouth 2 (two) times daily.     . cimetidine (TAGAMET) 400 MG tablet Take 400 mg by mouth 2 (two) times daily.     . cromolyn (GASTROCROM) 100 MG/5ML solution Take 200 mg by mouth 4 (four) times daily -  before meals  and at bedtime.     Marland Kitchen dexamethasone (DECADRON) 4 MG tablet Take 2 tablets by mouth once a day on the day after chemotherapy and then take 2 tablets two times a day for 2 days. Take with food. 30 tablet 1  . diphenhydrAMINE (BENADRYL) 50 MG capsule Take 50 mg by mouth 2 (two) times daily.     Marland Kitchen EPINEPHrine 0.3 mg/0.3 mL IJ SOAJ injection Inject 0.3 mg into the muscle once.     . fluticasone (FLONASE) 50 MCG/ACT nasal spray Place 2 sprays into both nostrils daily.     Marland Kitchen gabapentin (NEURONTIN) 600 MG tablet Take 1,200 mg by mouth 3 (three) times daily.     Marland Kitchen glimepiride (AMARYL) 4 MG tablet Take 4 mg by mouth 2 (two) times daily.     Marland Kitchen glucagon (GLUCAGON EMERGENCY) 1 MG injection Inject 1 mg into the muscle once as needed.     . imatinib (GLEEVEC) 400 MG  tablet Take 1 tablet (400 mg total) by mouth daily. 30 tablet 0  . Insulin Glargine (LANTUS Rembrandt) Inject 90 Units into the skin daily.     . insulin regular (NOVOLIN R,HUMULIN R) 100 units/mL injection Inject 80 Units into the skin 3 (three) times daily as needed.     . Insulin Syringe-Needle U-100 (B-D INS SYR ULTRAFINE .3CC/30G) 30G X 1/2" 0.3 ML MISC Use 1 Syringe as directed.    . Lancets 28G MISC Use 1 each 3 (three) times daily. Use as instructed.    . lidocaine-prilocaine (EMLA) cream Apply 1 application topically as needed. 30 g 0  . LORazepam (ATIVAN) 0.5 MG tablet Take 1 tablet (0.5 mg total) by mouth every 6 (six) hours as needed (Nausea or vomiting). 30 tablet 0  . losartan (COZAAR) 100 MG tablet Take 100 mg by mouth daily.     . Melatonin 10 MG TABS Take 10 mg by mouth at bedtime.     . metFORMIN (GLUCOPHAGE) 1000 MG tablet Take 2,000 mg by mouth daily with breakfast.     . Multiple Vitamin (MULTIVITAMIN) tablet Take 1 tablet by mouth daily.    Marland Kitchen omeprazole (PRILOSEC) 20 MG capsule Take 20 mg by mouth 2 (two) times daily before a meal.     . ondansetron (ZOFRAN) 8 MG tablet Take 1 tablet (8 mg total) by mouth 2 (two) times daily as needed. Start on the third day after chemotherapy. 30 tablet 1  . phenazopyridine (PYRIDIUM) 200 MG tablet Take 200 mg by mouth 2 (two) times daily.     . sucralfate (CARAFATE) 1 g tablet Take 1 tablet (1 g total) by mouth 3 (three) times daily. Dissolve in 2-3 tbsp warm water, swish and swallow. 90 tablet 3  . tamsulosin (FLOMAX) 0.4 MG CAPS capsule Take 0.4 mg by mouth daily.     Marland Kitchen tiZANidine (ZANAFLEX) 4 MG capsule Take 4 mg by mouth daily.     . traMADol (ULTRAM) 50 MG tablet Take 50 mg by mouth 3 (three) times daily.     Marland Kitchen zolpidem (AMBIEN CR) 12.5 MG CR tablet Take 12.5 mg by mouth at bedtime.     Marland Kitchen albuterol (PROVENTIL HFA;VENTOLIN HFA) 108 (90 Base) MCG/ACT inhaler Inhale 2 puffs into the lungs every 6 (six) hours as needed for wheezing.      No  current facility-administered medications for this visit.    Facility-Administered Medications Ordered in Other Visits  Medication Dose Route Frequency Provider Last Rate Last Dose  . heparin lock flush  100 unit/mL  500 Units Intravenous Once Lequita Asal, MD      . sodium chloride flush (NS) 0.9 % injection 10 mL  10 mL Intravenous PRN Lequita Asal, MD   10 mL at 03/18/17 8315    Review of Systems:  GENERAL:  Feels fine.  No fevers or sweats.  Weight down 1 pound. PERFORMANCE STATUS (ECOG):  1 HEENT:  Sore throat.  Left ear pain.  No visual changes, runny nose, mouth sores or tenderness. Lungs: Shortness of breath on exertion.  No cough.  No hemoptysis.  Sleep apnea. Cardiac:  No chest pain, palpitations, orthopnea, or PND. GI:  Constipation (see HPI).  Little nausea.  No vomiting, diarrhea, melena or hematochezia.  Hemorrhoids. GU:  No urgency, frequency, dysuria, or hematuria. On Flomax. Musculoskeletal:  No back pain.  No joint pain.  No muscle tenderness. Extremities:  No pain or swelling. Skin:  No severe flares or urticaria. Neuro:  No headache, numbness or weakness, balance or coordination issues. Endocrine:  Diabetes.  No tthyroid issues, hot flashes or night sweats. Psych:  No mood changes, depression or anxiety. Pain:  Left ear pain. Review of systems:  All other systems reviewed and found   Physical Exam: Blood pressure (!) 146/90, pulse 96, temperature 99 F (37.2 C), temperature source Tympanic, weight (!) 367 lb 9.6 oz (166.7 kg). GENERAL:  Well developed, well nourished, overweight gentleman sitting comfortably in the exam room in no acute distress. MENTAL STATUS:  Alert and oriented to person, place and time. HEAD:  Short dark crewcut.  Right side of face slightly full (subtle) overlying parotid gland.  Normocephalic, atraumatic, face symmetric, no Cushingoid features. EYES:  Glasses.  Pupils equal round and reactive to light and accomodation.  No  conjunctivitis or scleral icterus. ENT:  Oropharynx clear without lesion.  Right sided TM tube.  Left TM non erythematous with normal landmarks.  Tongue normal. Mucous membranes moist.  RESPIRATORY:  Clear to auscultation without rales, wheezes or rhonchi. CARDIOVASCULAR:  Regular rate and rhythm without murmur, rub or gallop. ABDOMEN:  Soft, non-tender, with active bowel sounds, and no appreciable hepatosplenomegaly.  No masses. SKIN:  No rashes or skin changes.  EXTREMITIES: No edema, no skin discoloration or tenderness.  No palpable cords. LYMPH NODES:  Left sided cervical adenopathy, improved (slightly flatter).  No palpable supraclavicular, axillary or inguinal adenopathy  NEUROLOGICAL: Unremarkable. PSYCH:  Appropriate.   Infusion on 03/18/2017  Component Date Value Ref Range Status  . WBC 03/18/2017 11.7* 3.8 - 10.6 K/uL Final  . RBC 03/18/2017 3.57* 4.40 - 5.90 MIL/uL Final  . Hemoglobin 03/18/2017 10.7* 13.0 - 18.0 g/dL Final  . HCT 03/18/2017 32.5* 40.0 - 52.0 % Final  . MCV 03/18/2017 91.2  80.0 - 100.0 fL Final  . MCH 03/18/2017 30.0  26.0 - 34.0 pg Final  . MCHC 03/18/2017 33.0  32.0 - 36.0 g/dL Final  . RDW 03/18/2017 16.4* 11.5 - 14.5 % Final  . Platelets 03/18/2017 288  150 - 440 K/uL Final  . Neutrophils Relative % 03/18/2017 71  % Final  . Neutro Abs 03/18/2017 8.3* 1.4 - 6.5 K/uL Final  . Lymphocytes Relative 03/18/2017 19  % Final  . Lymphs Abs 03/18/2017 2.2  1.0 - 3.6 K/uL Final  . Monocytes Relative 03/18/2017 8  % Final  . Monocytes Absolute 03/18/2017 0.9  0.2 - 1.0 K/uL Final  . Eosinophils Relative 03/18/2017 1  % Final  . Eosinophils Absolute 03/18/2017 0.2  0 -  0.7 K/uL Final  . Basophils Relative 03/18/2017 1  % Final  . Basophils Absolute 03/18/2017 0.1  0 - 0.1 K/uL Final  . Sodium 03/18/2017 136  135 - 145 mmol/L Final  . Potassium 03/18/2017 3.6  3.5 - 5.1 mmol/L Final  . Chloride 03/18/2017 102  101 - 111 mmol/L Final  . CO2 03/18/2017 29  22 -  32 mmol/L Final  . Glucose, Bld 03/18/2017 140* 65 - 99 mg/dL Final  . BUN 03/18/2017 9  6 - 20 mg/dL Final  . Creatinine, Ser 03/18/2017 0.69  0.61 - 1.24 mg/dL Final  . Calcium 03/18/2017 8.7* 8.9 - 10.3 mg/dL Final  . Total Protein 03/18/2017 6.6  6.5 - 8.1 g/dL Final  . Albumin 03/18/2017 3.7  3.5 - 5.0 g/dL Final  . AST 03/18/2017 53* 15 - 41 U/L Final  . ALT 03/18/2017 90* 17 - 63 U/L Final  . Alkaline Phosphatase 03/18/2017 69  38 - 126 U/L Final  . Total Bilirubin 03/18/2017 0.9  0.3 - 1.2 mg/dL Final  . GFR calc non Af Amer 03/18/2017 >60  >60 mL/min Final  . GFR calc Af Amer 03/18/2017 >60  >60 mL/min Final   Comment: (NOTE) The eGFR has been calculated using the CKD EPI equation. This calculation has not been validated in all clinical situations. eGFR's persistently <60 mL/min signify possible Chronic Kidney Disease.   . Anion gap 03/18/2017 5  5 - 15 Final  . Magnesium 03/18/2017 1.7  1.7 - 2.4 mg/dL Final    Assessment:  Dario Yono is a 46 y.o. 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  Excisional left neck node biopsy on 02/17/2017 revealed metastatic squamous cell carcinoma, P16 positive.  HPV in situ hybridization was positive (16/18).  EBV ISH was negative.  Galactose alpha 1,3 was negative.  Neck CT on 03/25/2018revealed bulky (left >right) cervical lymphadenopathy. The largest nodes wereat the left level II station measuring up to 4.4 cm long axis (2.5-3 cm short axis). There were smaller but abnormally rounded lymph nodes throughout the neck elsewhere. There were nocystic or necrotic nodes. There was superimposed nonspecific appearing inflammatory process at the right level 1B nodal station, with associated overlying cellulitis. There was no associated fluid collection.  Chest CT on 03/26/2018revealed scattered upper normal to mildly enlarged lymph nodes in the mediastinum and upper abdomen come  nonspecific for reactive versus neoplastic etiology. An index right hilar lymph node measures 1.1 cm in short axis.  PET scan on 04/09/2018revealed focal hypermetabolism in the left posterior oropharynx centered at the left base of tongue with associated asymmetric soft tissue fullness on the CT images, compatible with primary oropharyngeal carcinoma. There was hypermetabolic left level II cervical nodal metastases. There was no hypermetabolic contralateral cervical nodal metastases or distant metastatic disease. Clinical stageis T2N1M0.  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 Tryptase was 32.3 (2.2-13.2) on 07/29/2016. He is on several medications: cetirizine (Zyrtec), cimetidine (Tagamet), cromolyn (Gastrocrom), and Gleevec (400 mg a day). He uses an epinephrine pen 3-4 times a year.  He was on a trial ofinterferonin early 2016 x 48month. Interferon was discontinued in 07/2015. Interferon did not improve his symptoms. He experienceduncontrolled diabetes and elevated liver function tests with interferon.  [facility] [place] [facility]. There were small poorly defined areas of apparent spindle cells which did not stained positive for  mastcells. CK mutation was negative. There were rare atypical cells expressing CD-117 and CD-25. The cells represented approximately 0.1% of the leukocytes.   Bone marrow biopsyon 10/10/2013 performed at [facility] in [place] revealed evidence of persistent systemic mastocytosis (5-10% of cellularity). There was no increase in blast or overt dyspoiesis. There was moderate reticulin fibrosis. Storage iron was present. There were no ring sideroblasts. Flow cytometry revealed no phenotypic evidence of lymphoma or increased blasts. Cytogenetics were normal (46, XY). There was no evidence of  KIT(D816V) point mutation by PCR.   Neck CT on 03/25/2018revealed bulky (left >right) cervical lymphadenopathy. The largest nodes wereat the left level II station measuring up to 4.4 cm long axis (2.5-3 cm short axis). There were smaller but abnormally rounded lymph nodes throughout the neck elsewhere. There were nocystic or necrotic nodes. There was superimposed nonspecific appearing inflammatory process at the right level 1B nodal station, with associated overlying cellulitis. There was no associated fluid collection.  Chest CT on 03/26/2018revealed scattered upper normal to mildly enlarged lymph nodes in the mediastinum and upper abdomen come nonspecific for reactive versus neoplastic etiology. An index right hilar lymph node measures 1.1 cm in short axis.  He has osteopeniaon bone density study in 2014. He is on calcium and vitamin D.  He began radiation on 03/08/2017.  He began weekly cisplatin (40 mg/m2) on 03/11/2017.  He is tolerating treatment well with minimal nausea.  Symptomatically, he has left sided ear pain (similar to prior OM infections).  Exam reveals decreased left sided cervical adenopathy, subtle right parotid fulness, and a normal left tympanic membrane.  Calcium is slightly low.  Plan: 1.  Labs today:  CBC with diff, CMP, Mg. 2.  Week #2 cisplatin today 3.  Discuss calcium supplementation. 4.  Discuss ear pain etiology unclear.  Possible eustachian tube blockage. Discuss symptom management. 5.  Obtain copy of audiogram pre-treatment. 6.  RTC in 1 week for MD assessment, labs (CBC with diff, CMP, Mg) and week #3 cisplatin   Lequita Asal, MD  03/18/2017, 9:16 AM

## 2017-03-18 NOTE — Progress Notes (Signed)
Constipation x 2 days started taking miralax otc yesterday. c/o left ear pain no drainage, right facial swelling noted with redness and flat rash, pt forgot to take decadron yesterday prior to chemo.  No other c/o noted.

## 2017-03-19 ENCOUNTER — Encounter: Payer: Self-pay | Admitting: Hematology and Oncology

## 2017-03-19 DIAGNOSIS — Z5111 Encounter for antineoplastic chemotherapy: Secondary | ICD-10-CM | POA: Insufficient documentation

## 2017-03-21 ENCOUNTER — Ambulatory Visit
Admission: RE | Admit: 2017-03-21 | Discharge: 2017-03-21 | Disposition: A | Discharge status: Home / Self Care | Payer: BLUE CROSS/BLUE SHIELD | Source: Ambulatory Visit | Attending: Radiation Oncology | Admitting: Radiation Oncology

## 2017-03-21 DIAGNOSIS — Z510 Encounter for antineoplastic radiation therapy: Secondary | ICD-10-CM | POA: Diagnosis not present

## 2017-03-22 ENCOUNTER — Other Ambulatory Visit: Payer: BLUE CROSS/BLUE SHIELD

## 2017-03-22 ENCOUNTER — Ambulatory Visit: Payer: BLUE CROSS/BLUE SHIELD

## 2017-03-22 ENCOUNTER — Ambulatory Visit: Payer: BLUE CROSS/BLUE SHIELD | Admitting: Hematology and Oncology

## 2017-03-22 ENCOUNTER — Ambulatory Visit
Admission: RE | Admit: 2017-03-22 | Discharge: 2017-03-22 | Disposition: A | Discharge status: Home / Self Care | Payer: BLUE CROSS/BLUE SHIELD | Source: Ambulatory Visit | Attending: Radiation Oncology | Admitting: Radiation Oncology

## 2017-03-22 DIAGNOSIS — Z510 Encounter for antineoplastic radiation therapy: Secondary | ICD-10-CM | POA: Diagnosis not present

## 2017-03-23 ENCOUNTER — Other Ambulatory Visit: Payer: Self-pay | Admitting: *Deleted

## 2017-03-23 ENCOUNTER — Ambulatory Visit
Admission: RE | Admit: 2017-03-23 | Discharge: 2017-03-23 | Disposition: A | Discharge status: Home / Self Care | Payer: BLUE CROSS/BLUE SHIELD | Source: Ambulatory Visit | Attending: Radiation Oncology | Admitting: Radiation Oncology

## 2017-03-23 DIAGNOSIS — C029 Malignant neoplasm of tongue, unspecified: Secondary | ICD-10-CM

## 2017-03-23 DIAGNOSIS — Z510 Encounter for antineoplastic radiation therapy: Secondary | ICD-10-CM | POA: Diagnosis not present

## 2017-03-24 ENCOUNTER — Ambulatory Visit
Admission: RE | Admit: 2017-03-24 | Discharge: 2017-03-24 | Disposition: A | Discharge status: Home / Self Care | Payer: BLUE CROSS/BLUE SHIELD | Source: Ambulatory Visit | Attending: Radiation Oncology | Admitting: Radiation Oncology

## 2017-03-24 DIAGNOSIS — Z510 Encounter for antineoplastic radiation therapy: Secondary | ICD-10-CM | POA: Diagnosis not present

## 2017-03-25 ENCOUNTER — Inpatient Hospital Stay: Payer: BLUE CROSS/BLUE SHIELD | Attending: Hematology and Oncology

## 2017-03-25 ENCOUNTER — Ambulatory Visit
Admission: RE | Admit: 2017-03-25 | Discharge: 2017-03-25 | Disposition: A | Discharge status: Home / Self Care | Payer: BLUE CROSS/BLUE SHIELD | Source: Ambulatory Visit | Attending: Radiation Oncology | Admitting: Radiation Oncology

## 2017-03-25 ENCOUNTER — Inpatient Hospital Stay (HOSPITAL_BASED_OUTPATIENT_CLINIC_OR_DEPARTMENT_OTHER): Payer: BLUE CROSS/BLUE SHIELD | Admitting: Hematology and Oncology

## 2017-03-25 ENCOUNTER — Inpatient Hospital Stay: Payer: BLUE CROSS/BLUE SHIELD

## 2017-03-25 VITALS — BP 126/72 | HR 100 | Temp 98.5°F | Resp 18 | Wt 362.5 lb

## 2017-03-25 DIAGNOSIS — J449 Chronic obstructive pulmonary disease, unspecified: Secondary | ICD-10-CM

## 2017-03-25 DIAGNOSIS — Z794 Long term (current) use of insulin: Secondary | ICD-10-CM | POA: Insufficient documentation

## 2017-03-25 DIAGNOSIS — M8580 Other specified disorders of bone density and structure, unspecified site: Secondary | ICD-10-CM | POA: Diagnosis not present

## 2017-03-25 DIAGNOSIS — G893 Neoplasm related pain (acute) (chronic): Secondary | ICD-10-CM | POA: Insufficient documentation

## 2017-03-25 DIAGNOSIS — D4702 Systemic mastocytosis: Secondary | ICD-10-CM | POA: Diagnosis not present

## 2017-03-25 DIAGNOSIS — K1230 Oral mucositis (ulcerative), unspecified: Secondary | ICD-10-CM | POA: Diagnosis not present

## 2017-03-25 DIAGNOSIS — Z7982 Long term (current) use of aspirin: Secondary | ICD-10-CM | POA: Diagnosis not present

## 2017-03-25 DIAGNOSIS — Z5111 Encounter for antineoplastic chemotherapy: Secondary | ICD-10-CM | POA: Diagnosis not present

## 2017-03-25 DIAGNOSIS — F329 Major depressive disorder, single episode, unspecified: Secondary | ICD-10-CM

## 2017-03-25 DIAGNOSIS — K1233 Oral mucositis (ulcerative) due to radiation: Secondary | ICD-10-CM | POA: Insufficient documentation

## 2017-03-25 DIAGNOSIS — G629 Polyneuropathy, unspecified: Secondary | ICD-10-CM

## 2017-03-25 DIAGNOSIS — H9202 Otalgia, left ear: Secondary | ICD-10-CM | POA: Diagnosis not present

## 2017-03-25 DIAGNOSIS — J029 Acute pharyngitis, unspecified: Secondary | ICD-10-CM | POA: Diagnosis not present

## 2017-03-25 DIAGNOSIS — Z510 Encounter for antineoplastic radiation therapy: Secondary | ICD-10-CM | POA: Diagnosis not present

## 2017-03-25 DIAGNOSIS — Z79899 Other long term (current) drug therapy: Secondary | ICD-10-CM | POA: Diagnosis not present

## 2017-03-25 DIAGNOSIS — I10 Essential (primary) hypertension: Secondary | ICD-10-CM

## 2017-03-25 DIAGNOSIS — K219 Gastro-esophageal reflux disease without esophagitis: Secondary | ICD-10-CM | POA: Diagnosis not present

## 2017-03-25 DIAGNOSIS — C01 Malignant neoplasm of base of tongue: Secondary | ICD-10-CM | POA: Insufficient documentation

## 2017-03-25 DIAGNOSIS — Z87891 Personal history of nicotine dependence: Secondary | ICD-10-CM

## 2017-03-25 DIAGNOSIS — E119 Type 2 diabetes mellitus without complications: Secondary | ICD-10-CM | POA: Insufficient documentation

## 2017-03-25 DIAGNOSIS — Z923 Personal history of irradiation: Secondary | ICD-10-CM

## 2017-03-25 DIAGNOSIS — B370 Candidal stomatitis: Secondary | ICD-10-CM | POA: Insufficient documentation

## 2017-03-25 DIAGNOSIS — C029 Malignant neoplasm of tongue, unspecified: Secondary | ICD-10-CM

## 2017-03-25 LAB — COMPREHENSIVE METABOLIC PANEL
ALT: 84 U/L — ABNORMAL HIGH (ref 17–63)
AST: 46 U/L — ABNORMAL HIGH (ref 15–41)
Albumin: 3.6 g/dL (ref 3.5–5.0)
Alkaline Phosphatase: 66 U/L (ref 38–126)
Anion gap: 8 (ref 5–15)
BUN: 16 mg/dL (ref 6–20)
CO2: 27 mmol/L (ref 22–32)
Calcium: 9 mg/dL (ref 8.9–10.3)
Chloride: 102 mmol/L (ref 101–111)
Creatinine, Ser: 0.86 mg/dL (ref 0.61–1.24)
GFR calc Af Amer: >60 mL/min (ref >60)
GFR calc non Af Amer: >60 mL/min (ref >60)
Glucose, Bld: 163 mg/dL — ABNORMAL HIGH (ref 65–99)
Potassium: 4.2 mmol/L (ref 3.5–5.1)
Sodium: 137 mmol/L (ref 135–145)
Total Bilirubin: 1 mg/dL (ref 0.3–1.2)
Total Protein: 6.7 g/dL (ref 6.5–8.1)

## 2017-03-25 LAB — CBC WITH DIFFERENTIAL/PLATELET
Basophils Absolute: 0.1 10*3/uL (ref 0–0.1)
Basophils Relative: 0 %
Eosinophils Absolute: 0.1 10*3/uL (ref 0–0.7)
Eosinophils Relative: 1 %
HCT: 31.7 % — ABNORMAL LOW (ref 40.0–52.0)
Hemoglobin: 10.4 g/dL — ABNORMAL LOW (ref 13.0–18.0)
Lymphocytes Relative: 10 %
Lymphs Abs: 1.4 10*3/uL (ref 1.0–3.6)
MCH: 30.5 pg (ref 26.0–34.0)
MCHC: 32.9 g/dL (ref 32.0–36.0)
MCV: 92.7 fL (ref 80.0–100.0)
Monocytes Absolute: 1 10*3/uL (ref 0.2–1.0)
Monocytes Relative: 7 %
Neutro Abs: 12 10*3/uL — ABNORMAL HIGH (ref 1.4–6.5)
Neutrophils Relative %: 82 %
Platelets: 226 10*3/uL (ref 150–440)
RBC: 3.42 MIL/uL — ABNORMAL LOW (ref 4.40–5.90)
RDW: 17 % — ABNORMAL HIGH (ref 11.5–14.5)
WBC: 14.7 10*3/uL — ABNORMAL HIGH (ref 3.8–10.6)

## 2017-03-25 LAB — MAGNESIUM: Magnesium: 1.9 mg/dL (ref 1.7–2.4)

## 2017-03-25 MED ORDER — OXYCODONE-ACETAMINOPHEN 5-325 MG PO TABS: 1.0000 | ORAL_TABLET | Freq: Four times a day (QID) | ORAL | 0 refills | Status: DC | PRN

## 2017-03-25 MED ORDER — HEPARIN SOD (PORK) LOCK FLUSH 100 UNIT/ML IV SOLN
500.0000 [IU] | Freq: Once | INTRAVENOUS | Status: AC | PRN
  Administered 2017-03-25: 500 [IU]
  Filled 2017-03-25 (×2): qty 5

## 2017-03-25 MED ORDER — MAGIC MOUTHWASH W/LIDOCAINE: 5.0000 mL | Freq: Four times a day (QID) | ORAL | 0 refills | Status: DC

## 2017-03-25 MED ORDER — PALONOSETRON HCL INJECTION 0.25 MG/5ML
0.2500 mg | Freq: Once | INTRAVENOUS | Status: AC
  Administered 2017-03-25: 0.25 mg via INTRAVENOUS
  Filled 2017-03-25: qty 5

## 2017-03-25 MED ORDER — SODIUM CHLORIDE 0.9 % IV SOLN
Freq: Once | INTRAVENOUS | Status: AC
  Administered 2017-03-25: 13:00:00 via INTRAVENOUS
  Filled 2017-03-25: qty 5

## 2017-03-25 MED ORDER — SODIUM CHLORIDE 0.9 % IV SOLN
Freq: Once | INTRAVENOUS | Status: AC
  Administered 2017-03-25: 10:00:00 via INTRAVENOUS
  Filled 2017-03-25: qty 1000

## 2017-03-25 MED ORDER — SODIUM CHLORIDE 0.9 % IV SOLN
40.0000 mg/m2 | Freq: Once | INTRAVENOUS | Status: AC
  Administered 2017-03-25: 120 mg via INTRAVENOUS
  Filled 2017-03-25: qty 50

## 2017-03-25 MED ORDER — SODIUM CHLORIDE 0.9% FLUSH
10.0000 mL | INTRAVENOUS | Status: DC | PRN
  Administered 2017-03-25: 10 mL
  Filled 2017-03-25: qty 10

## 2017-03-25 MED ORDER — POTASSIUM CHLORIDE 2 MEQ/ML IV SOLN
Freq: Once | INTRAVENOUS | Status: AC
  Administered 2017-03-25: 10:00:00 via INTRAVENOUS
  Filled 2017-03-25: qty 1000

## 2017-03-25 NOTE — Progress Notes (Signed)
Patient states he sometimes gets phlegm in his throat that is hard to expectorate.  Also his throat is sore from radiation.

## 2017-03-25 NOTE — Progress Notes (Signed)
[facility] day:  03/25/2017   Chief Complaint: Terel Bann is a 46 y.o. male with systemic mastocytosis who is seen for assessment prior to week #3 cisplatin with concurrent radiation.  HPI:  The patient was last seen in the medical oncology clinic on 03/18/2017.  At that time, he had left sided ear pain (similar to prior OM infections).  Exam revealed decreased left sided cervical adenopathy, subtle right parotid fulness, and a normal left tympanic membrane.  Calcium was slightly low.  He received week #2 cisplatin.  During the interim, he has continued radiation. He states that his ear "cleared up".  He has occasional ringing in his ears. He notes a sore throat rated an 8 out of 10.  Phlegm is thick.  He is using saltwater rinses.  His wife notes that his neck adenopathy has improved.  He denies any nausea or vomiting.   Past Medical History:  Diagnosis Date  . COPD (chronic obstructive pulmonary disease) ([place])   . Depression   . Diabetes mellitus without complication ([place])   . GERD (gastroesophageal reflux disease)   . Hypertension   . Neuropathy    from knees down, "tingling" in hands  . S/P insertion of spinal cord stimulator    (x2)  . Sleep apnea   . Systemic mastocytosis    Per pt    Past Surgical History:  Procedure Laterality Date  . cataract surgery  08/06/2014  . cataract surgery  11/05/2014  . MASS BIOPSY Left 02/17/2017   Procedure: NECK MASS BIOPSY;  Surgeon: Margaretha Sheffield, MD;  Location: [facility];  Service: ENT;  Laterality: Left;  NEED HARMONIC SCAPLE diabetic - insulin and oral meds sleep apnea  . PORTA CATH INSERTION N/A 02/22/2017   Procedure: Glori Luis Cath Insertion;  Surgeon: Algernon Huxley, MD;  Location: [facility];  Service: Cardiovascular;  Laterality: N/A;  . SEPTOPLASTY  12/15/2011  . Spinal implants  10/06/2011  . TYMPANOSTOMY TUBE PLACEMENT Right 11/03/2011  . X-STOP IMPLANTATION Right  07/25/2013    Family History  Problem Relation Age of Onset  . Diabetes Father   . Hypertension Father   . Diabetes Paternal Grandmother     Social History:  reports that he quit smoking about 2 years ago. He has never used smokeless tobacco. He reports that he does not drink alcohol or use drugs.  He and his wife moved from [place].  He lives in [place].  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  The patient is accompanied by his wife, Stanton Kidney.  Allergies: No Known Allergies  Current Medications: Current Outpatient Prescriptions  Medication Sig Dispense Refill  . albuterol (PROVENTIL HFA;VENTOLIN HFA) 108 (90 Base) MCG/ACT inhaler Inhale 2 puffs into the lungs every 6 (six) hours as needed for wheezing.     Marland Kitchen amitriptyline (ELAVIL) 25 MG tablet Take 25 mg by mouth daily.    Marland Kitchen amLODipine (NORVASC) 5 MG tablet Take 5 mg by mouth daily.    Marland Kitchen aspirin 500 MG tablet Take 500 mg by mouth 2 (two) times daily.    Marland Kitchen atorvastatin (LIPITOR) 20 MG tablet Take 20 mg by mouth daily.     . Calcium Citrate 200 MG TABS Take 1,000 mg by mouth 2 (two) times daily.     . Cetirizine HCl 10 MG CAPS Take 10 mg by mouth 2 (two) times daily.     . cimetidine (TAGAMET) 400 MG tablet Take 400 mg by  mouth 2 (two) times daily.     . cromolyn (GASTROCROM) 100 MG/5ML solution Take 200 mg by mouth 4 (four) times daily -  before meals and at bedtime.     Marland Kitchen dexamethasone (DECADRON) 4 MG tablet Take 2 tablets by mouth once a day on the day after chemotherapy and then take 2 tablets two times a day for 2 days. Take with food. 30 tablet 1  . diphenhydrAMINE (BENADRYL) 50 MG capsule Take 50 mg by mouth 2 (two) times daily.     Marland Kitchen EPINEPHrine 0.3 mg/0.3 mL IJ SOAJ injection Inject 0.3 mg into the muscle once.     . fluticasone (FLONASE) 50 MCG/ACT nasal spray Place 2 sprays into both nostrils daily.     Marland Kitchen gabapentin (NEURONTIN) 600 MG tablet Take 1,200 mg by mouth 3 (three) times daily.     Marland Kitchen glimepiride (AMARYL) 4 MG tablet Take 4 mg by mouth  2 (two) times daily.     Marland Kitchen glucagon (GLUCAGON EMERGENCY) 1 MG injection Inject 1 mg into the muscle once as needed.     . imatinib (GLEEVEC) 400 MG tablet Take 1 tablet (400 mg total) by mouth daily. 30 tablet 0  . Insulin Glargine (LANTUS ) Inject 90 Units into the skin daily.     . insulin regular (NOVOLIN R,HUMULIN R) 100 units/mL injection Inject 80 Units into the skin 3 (three) times daily as needed.     . Insulin Syringe-Needle U-100 (B-D INS SYR ULTRAFINE .3CC/30G) 30G X 1/2" 0.3 ML MISC Use 1 Syringe as directed.    . Lancets 28G MISC Use 1 each 3 (three) times daily. Use as instructed.    . lidocaine-prilocaine (EMLA) cream Apply 1 application topically as needed. 30 g 0  . LORazepam (ATIVAN) 0.5 MG tablet Take 1 tablet (0.5 mg total) by mouth every 6 (six) hours as needed (Nausea or vomiting). 30 tablet 0  . losartan (COZAAR) 100 MG tablet Take 100 mg by mouth daily.     . Melatonin 10 MG TABS Take 10 mg by mouth at bedtime.     . metFORMIN (GLUCOPHAGE) 1000 MG tablet Take 2,000 mg by mouth daily with breakfast.     . Multiple Vitamin (MULTIVITAMIN) tablet Take 1 tablet by mouth daily.    Marland Kitchen omeprazole (PRILOSEC) 20 MG capsule Take 20 mg by mouth 2 (two) times daily before a meal.     . ondansetron (ZOFRAN) 8 MG tablet Take 1 tablet (8 mg total) by mouth 2 (two) times daily as needed. Start on the third day after chemotherapy. 30 tablet 1  . phenazopyridine (PYRIDIUM) 200 MG tablet Take 200 mg by mouth 2 (two) times daily.     . sucralfate (CARAFATE) 1 g tablet Take 1 tablet (1 g total) by mouth 3 (three) times daily. Dissolve in 2-3 tbsp warm water, swish and swallow. 90 tablet 3  . tamsulosin (FLOMAX) 0.4 MG CAPS capsule Take 0.4 mg by mouth daily.     Marland Kitchen tiZANidine (ZANAFLEX) 4 MG capsule Take 4 mg by mouth daily.     . traMADol (ULTRAM) 50 MG tablet Take 50 mg by mouth 3 (three) times daily.     Marland Kitchen zolpidem (AMBIEN CR) 12.5 MG CR tablet Take 12.5 mg by mouth at bedtime.      No  current facility-administered medications for this visit.     Review of Systems:  GENERAL:  Feels"ok".  No fevers or sweats.  Weight down 5 pounds. PERFORMANCE STATUS (ECOG):  1 HEENT:  Sore throat (see HPI).  Thick secretions.  Left ear pain, resolved.  Occasional tinnitus.  No visual changes, runny nose, mouth sores or tenderness. Lungs: Shortness of breath on exertion.  No cough.  No hemoptysis.  Sleep apnea. Cardiac:  No chest pain, palpitations, orthopnea, or PND. GI:  Little nausea.  No vomiting, diarrhea, melena or hematochezia.  Hemorrhoids. GU:  No urgency, frequency, dysuria, or hematuria. On Flomax. Musculoskeletal:  No back pain.  No joint pain.  No muscle tenderness. Extremities:  No pain or swelling. Skin:  No severe flares or urticaria. Neuro:  No headache, numbness or weakness, balance or coordination issues. Endocrine:  Diabetes.  No tthyroid issues, hot flashes or night sweats. Psych:  No mood changes, depression or anxiety. Pain:  Left ear pain. Review of systems:  All other systems reviewed and found   Physical Exam: Blood pressure 126/72, pulse 100, temperature 98.5 F (36.9 C), temperature source Tympanic, resp. rate 18, weight (!) 362 lb 8 oz (164.4 kg). GENERAL:  Well developed, well nourished, overweight gentleman sitting comfortably in the exam room in no acute distress. MENTAL STATUS:  Alert and oriented to person, place and time. HEAD:  Short dark crewcut.  Normocephalic, atraumatic, face symmetric, no Cushingoid features. EYES:  Glasses.  Pupils equal round and reactive to light and accomodation.  No conjunctivitis or scleral icterus. ENT:  Oropharynx with early mucositis posterior pharynx/uvula.  Secretions thick.  Tongue normal.  Mucous membranes moist.  RESPIRATORY:  Clear to auscultation without rales, wheezes or rhonchi. CARDIOVASCULAR:  Regular rate and rhythm without murmur, rub or gallop. ABDOMEN:  Soft, non-tender, with active bowel sounds, and no  appreciable hepatosplenomegaly.  No masses. SKIN:  No rashes or skin changes.  EXTREMITIES: No edema, no skin discoloration or tenderness.  No palpable cords. LYMPH NODES:  Left sided cervical adenopathy, improved (soft and flat).  No palpable supraclavicular, axillary or inguinal adenopathy  NEUROLOGICAL: Unremarkable. PSYCH:  Appropriate.   Appointment on 03/25/2017  Component Date Value Ref Range Status  . WBC 03/25/2017 14.7* 3.8 - 10.6 K/uL Final  . RBC 03/25/2017 3.42* 4.40 - 5.90 MIL/uL Final  . Hemoglobin 03/25/2017 10.4* 13.0 - 18.0 g/dL Final  . HCT 03/25/2017 31.7* 40.0 - 52.0 % Final  . MCV 03/25/2017 92.7  80.0 - 100.0 fL Final  . MCH 03/25/2017 30.5  26.0 - 34.0 pg Final  . MCHC 03/25/2017 32.9  32.0 - 36.0 g/dL Final  . RDW 03/25/2017 17.0* 11.5 - 14.5 % Final  . Platelets 03/25/2017 226  150 - 440 K/uL Final  . Neutrophils Relative % 03/25/2017 82  % Final  . Neutro Abs 03/25/2017 12.0* 1.4 - 6.5 K/uL Final  . Lymphocytes Relative 03/25/2017 10  % Final  . Lymphs Abs 03/25/2017 1.4  1.0 - 3.6 K/uL Final  . Monocytes Relative 03/25/2017 7  % Final  . Monocytes Absolute 03/25/2017 1.0  0.2 - 1.0 K/uL Final  . Eosinophils Relative 03/25/2017 1  % Final  . Eosinophils Absolute 03/25/2017 0.1  0 - 0.7 K/uL Final  . Basophils Relative 03/25/2017 0  % Final  . Basophils Absolute 03/25/2017 0.1  0 - 0.1 K/uL Final  . Sodium 03/25/2017 137  135 - 145 mmol/L Final  . Potassium 03/25/2017 4.2  3.5 - 5.1 mmol/L Final  . Chloride 03/25/2017 102  101 - 111 mmol/L Final  . CO2 03/25/2017 27  22 - 32 mmol/L Final  . Glucose, Bld 03/25/2017 163* 65 -  99 mg/dL Final  . BUN 03/25/2017 16  6 - 20 mg/dL Final  . Creatinine, Ser 03/25/2017 0.86  0.61 - 1.24 mg/dL Final  . Calcium 03/25/2017 9.0  8.9 - 10.3 mg/dL Final  . Total Protein 03/25/2017 6.7  6.5 - 8.1 g/dL Final  . Albumin 03/25/2017 3.6  3.5 - 5.0 g/dL Final  . AST 03/25/2017 46* 15 - 41 U/L Final  . ALT 03/25/2017 84* 17  - 63 U/L Final  . Alkaline Phosphatase 03/25/2017 66  38 - 126 U/L Final  . Total Bilirubin 03/25/2017 1.0  0.3 - 1.2 mg/dL Final  . GFR calc non Af Amer 03/25/2017 >60  >60 mL/min Final  . GFR calc Af Amer 03/25/2017 >60  >60 mL/min Final   Comment: (NOTE) The eGFR has been calculated using the CKD EPI equation. This calculation has not been validated in all clinical situations. eGFR's persistently <60 mL/min signify possible Chronic Kidney Disease.   . Anion gap 03/25/2017 8  5 - 15 Final  . Magnesium 03/25/2017 1.9  1.7 - 2.4 mg/dL Final    Assessment:  Dwayne Richard is a 46 y.o. 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  Excisional left neck node biopsy on 02/17/2017 revealed metastatic squamous cell carcinoma, P16 positive.  HPV in situ hybridization was positive (16/18).  EBV ISH was negative.  Galactose alpha 1,3 was negative.  Neck CT on 03/25/2018revealed bulky (left >right) cervical lymphadenopathy. The largest nodes wereat the left level II station measuring up to 4.4 cm long axis (2.5-3 cm short axis). There were smaller but abnormally rounded lymph nodes throughout the neck elsewhere. There were nocystic or necrotic nodes. There was superimposed nonspecific appearing inflammatory process at the right level 1B nodal station, with associated overlying cellulitis. There was no associated fluid collection.  Chest CT on 03/26/2018revealed scattered upper normal to mildly enlarged lymph nodes in the mediastinum and upper abdomen come nonspecific for reactive versus neoplastic etiology. An index right hilar lymph node measures 1.1 cm in short axis.  PET scan on 04/09/2018revealed focal hypermetabolism in the left posterior oropharynx centered at the left base of tongue with associated asymmetric soft tissue fullness on the CT images, compatible with primary oropharyngeal carcinoma. There was hypermetabolic left level II  cervical nodal metastases. There was no hypermetabolic contralateral cervical nodal metastases or distant metastatic disease. Clinical stageis T2N1M0.  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 Tryptase was 32.3 (2.2-13.2) on 07/29/2016. He is on several medications: cetirizine (Zyrtec), cimetidine (Tagamet), cromolyn (Gastrocrom), and Gleevec (400 mg a day). He uses an epinephrine pen 3-4 times a year.  He was on a trial ofinterferonin early 2016 x 32month. Interferon was discontinued in 07/2015. Interferon did not improve his symptoms. He experienceduncontrolled diabetes and elevated liver function tests with interferon.  [facility]. There were small poorly defined areas of apparent spindle cells which did not stained positive for mastcells. CK mutation was negative. There were rare atypical cells expressing CD-117 and CD-25. The cells represented approximately 0.1% of the leukocytes.   [facility] [place] of persistent systemic mastocytosis (5-10% of cellularity). There was no increase in blast or overt dyspoiesis. There was moderate reticulin fibrosis. Storage iron was present. There were no ring sideroblasts. Flow cytometry revealed no phenotypic evidence of  lymphoma or increased blasts. Cytogenetics were normal (46, XY). There was no evidence of KIT(D816V) point mutation by PCR.   Neck CT on 03/25/2018revealed bulky (left >right) cervical lymphadenopathy. The largest nodes wereat the left level II station measuring up to 4.4 cm long axis (2.5-3 cm short axis). There were smaller but abnormally rounded lymph nodes throughout the neck elsewhere. There were nocystic or necrotic nodes. There was superimposed nonspecific appearing  inflammatory process at the right level 1B nodal station, with associated overlying cellulitis. There was no associated fluid collection.  Chest CT on 03/26/2018revealed scattered upper normal to mildly enlarged lymph nodes in the mediastinum and upper abdomen come nonspecific for reactive versus neoplastic etiology. An index right hilar lymph node measures 1.1 cm in short axis.  He has osteopeniaon bone density study in 2014. He is on calcium and vitamin D.  He began radiation on 03/08/2017.  He began weekly cisplatin (40 mg/m2) on 03/11/2017.  He is currently on week #3.  He is tolerating treatment well.  Symptomatically, he has throat pain.  Exam reveals early mucositis.  Left sided cervical adenopathy has decreased.  Plan: 1.  Labs today:  CBC with diff, CMP, Mg. 2.  Week #3 cisplatin today. 3.  Rx:  Magic mouthwash with lidocaine 4.  Rx: oxycodone 5/acetaminophen 325 1 tablet po q 6 prn pain (dis: #30) 5.  Discuss importance of fluid and caloric intake. 6.  RTC in 1 week for MD assessment, labs (CBC with diff, CMP, Mg) and week #4 cisplatin   Lequita Asal, MD  03/25/2017, 9:43 AM

## 2017-03-26 ENCOUNTER — Encounter: Payer: Self-pay | Admitting: Hematology and Oncology

## 2017-03-26 DIAGNOSIS — K1233 Oral mucositis (ulcerative) due to radiation: Secondary | ICD-10-CM | POA: Insufficient documentation

## 2017-03-28 ENCOUNTER — Ambulatory Visit
Admission: RE | Admit: 2017-03-28 | Discharge: 2017-03-28 | Disposition: A | Discharge status: Home / Self Care | Payer: BLUE CROSS/BLUE SHIELD | Source: Ambulatory Visit | Attending: Radiation Oncology | Admitting: Radiation Oncology

## 2017-03-28 DIAGNOSIS — Z510 Encounter for antineoplastic radiation therapy: Secondary | ICD-10-CM | POA: Diagnosis not present

## 2017-03-29 ENCOUNTER — Other Ambulatory Visit: Payer: Self-pay | Admitting: Hematology and Oncology

## 2017-03-29 ENCOUNTER — Ambulatory Visit: Payer: BLUE CROSS/BLUE SHIELD | Admitting: Hematology and Oncology

## 2017-03-29 ENCOUNTER — Other Ambulatory Visit: Payer: BLUE CROSS/BLUE SHIELD

## 2017-03-29 ENCOUNTER — Ambulatory Visit: Payer: BLUE CROSS/BLUE SHIELD

## 2017-03-29 ENCOUNTER — Ambulatory Visit
Admission: RE | Admit: 2017-03-29 | Discharge: 2017-03-29 | Disposition: A | Discharge status: Home / Self Care | Payer: BLUE CROSS/BLUE SHIELD | Source: Ambulatory Visit | Attending: Radiation Oncology | Admitting: Radiation Oncology

## 2017-03-29 DIAGNOSIS — Z510 Encounter for antineoplastic radiation therapy: Secondary | ICD-10-CM | POA: Diagnosis not present

## 2017-03-30 ENCOUNTER — Ambulatory Visit: Payer: BLUE CROSS/BLUE SHIELD

## 2017-03-31 ENCOUNTER — Ambulatory Visit: Payer: BLUE CROSS/BLUE SHIELD

## 2017-04-01 ENCOUNTER — Ambulatory Visit: Payer: BLUE CROSS/BLUE SHIELD

## 2017-04-01 ENCOUNTER — Inpatient Hospital Stay: Payer: BLUE CROSS/BLUE SHIELD

## 2017-04-01 ENCOUNTER — Encounter: Payer: Self-pay | Admitting: Oncology

## 2017-04-01 ENCOUNTER — Other Ambulatory Visit: Payer: Self-pay | Admitting: Hematology and Oncology

## 2017-04-01 ENCOUNTER — Inpatient Hospital Stay (HOSPITAL_BASED_OUTPATIENT_CLINIC_OR_DEPARTMENT_OTHER): Payer: BLUE CROSS/BLUE SHIELD | Admitting: Oncology

## 2017-04-01 VITALS — BP 144/84 | HR 103 | Temp 98.4°F | Resp 20 | Wt 361.2 lb

## 2017-04-01 DIAGNOSIS — C01 Malignant neoplasm of base of tongue: Secondary | ICD-10-CM | POA: Diagnosis not present

## 2017-04-01 DIAGNOSIS — M8580 Other specified disorders of bone density and structure, unspecified site: Secondary | ICD-10-CM

## 2017-04-01 DIAGNOSIS — I10 Essential (primary) hypertension: Secondary | ICD-10-CM

## 2017-04-01 DIAGNOSIS — D4702 Systemic mastocytosis: Secondary | ICD-10-CM

## 2017-04-01 DIAGNOSIS — K219 Gastro-esophageal reflux disease without esophagitis: Secondary | ICD-10-CM | POA: Diagnosis not present

## 2017-04-01 DIAGNOSIS — H9202 Otalgia, left ear: Secondary | ICD-10-CM

## 2017-04-01 DIAGNOSIS — G629 Polyneuropathy, unspecified: Secondary | ICD-10-CM

## 2017-04-01 DIAGNOSIS — G893 Neoplasm related pain (acute) (chronic): Secondary | ICD-10-CM

## 2017-04-01 DIAGNOSIS — J029 Acute pharyngitis, unspecified: Secondary | ICD-10-CM

## 2017-04-01 DIAGNOSIS — Z79899 Other long term (current) drug therapy: Secondary | ICD-10-CM

## 2017-04-01 DIAGNOSIS — F329 Major depressive disorder, single episode, unspecified: Secondary | ICD-10-CM

## 2017-04-01 DIAGNOSIS — K1230 Oral mucositis (ulcerative), unspecified: Secondary | ICD-10-CM

## 2017-04-01 DIAGNOSIS — Z5111 Encounter for antineoplastic chemotherapy: Secondary | ICD-10-CM

## 2017-04-01 DIAGNOSIS — Z87891 Personal history of nicotine dependence: Secondary | ICD-10-CM

## 2017-04-01 DIAGNOSIS — J449 Chronic obstructive pulmonary disease, unspecified: Secondary | ICD-10-CM

## 2017-04-01 DIAGNOSIS — E119 Type 2 diabetes mellitus without complications: Secondary | ICD-10-CM

## 2017-04-01 DIAGNOSIS — Z923 Personal history of irradiation: Secondary | ICD-10-CM

## 2017-04-01 DIAGNOSIS — Z794 Long term (current) use of insulin: Secondary | ICD-10-CM

## 2017-04-01 DIAGNOSIS — Z7982 Long term (current) use of aspirin: Secondary | ICD-10-CM

## 2017-04-01 LAB — COMPREHENSIVE METABOLIC PANEL
ALT: 67 U/L — ABNORMAL HIGH (ref 17–63)
AST: 30 U/L (ref 15–41)
Albumin: 3.5 g/dL (ref 3.5–5.0)
Alkaline Phosphatase: 64 U/L (ref 38–126)
Anion gap: 4 — ABNORMAL LOW (ref 5–15)
BUN: 15 mg/dL (ref 6–20)
CO2: 30 mmol/L (ref 22–32)
Calcium: 8.5 mg/dL — ABNORMAL LOW (ref 8.9–10.3)
Chloride: 106 mmol/L (ref 101–111)
Creatinine, Ser: 0.76 mg/dL (ref 0.61–1.24)
GFR calc Af Amer: >60 mL/min (ref >60)
GFR calc non Af Amer: >60 mL/min (ref >60)
Glucose, Bld: 92 mg/dL (ref 65–99)
Potassium: 3.6 mmol/L (ref 3.5–5.1)
Sodium: 140 mmol/L (ref 135–145)
Total Bilirubin: 0.6 mg/dL (ref 0.3–1.2)
Total Protein: 6.4 g/dL — ABNORMAL LOW (ref 6.5–8.1)

## 2017-04-01 LAB — CBC WITH DIFFERENTIAL/PLATELET
Basophils Absolute: 0 10*3/uL (ref 0–0.1)
Basophils Relative: 0 %
Eosinophils Absolute: 0.1 10*3/uL (ref 0–0.7)
Eosinophils Relative: 1 %
HCT: 28.7 % — ABNORMAL LOW (ref 40.0–52.0)
Hemoglobin: 9.5 g/dL — ABNORMAL LOW (ref 13.0–18.0)
Lymphocytes Relative: 13 %
Lymphs Abs: 1 10*3/uL (ref 1.0–3.6)
MCH: 31 pg (ref 26.0–34.0)
MCHC: 33.2 g/dL (ref 32.0–36.0)
MCV: 93.2 fL (ref 80.0–100.0)
Monocytes Absolute: 0.4 10*3/uL (ref 0.2–1.0)
Monocytes Relative: 6 %
Neutro Abs: 5.9 10*3/uL (ref 1.4–6.5)
Neutrophils Relative %: 80 %
Platelets: 176 10*3/uL (ref 150–440)
RBC: 3.08 MIL/uL — ABNORMAL LOW (ref 4.40–5.90)
RDW: 17.3 % — ABNORMAL HIGH (ref 11.5–14.5)
WBC: 7.4 10*3/uL (ref 3.8–10.6)

## 2017-04-01 LAB — MAGNESIUM: Magnesium: 1.8 mg/dL (ref 1.7–2.4)

## 2017-04-01 LAB — ACID FAST CULTURE WITH REFLEXED SENSITIVITIES (MYCOBACTERIA)

## 2017-04-01 LAB — ACID FAST CULTURE WITH REFLEXED SENSITIVITIES: ACID FAST CULTURE - AFSCU3: NEGATIVE

## 2017-04-01 MED ORDER — OXYCODONE-ACETAMINOPHEN 5-325 MG PO TABS: 1.0000 | ORAL_TABLET | ORAL | 0 refills | Status: DC | PRN

## 2017-04-01 MED ORDER — HEPARIN SOD (PORK) LOCK FLUSH 100 UNIT/ML IV SOLN
500.0000 [IU] | Freq: Once | INTRAVENOUS | Status: AC
  Administered 2017-04-01: 500 [IU] via INTRAVENOUS
  Filled 2017-04-01: qty 5

## 2017-04-01 MED ORDER — POTASSIUM CHLORIDE 2 MEQ/ML IV SOLN
Freq: Once | INTRAVENOUS | Status: AC
  Administered 2017-04-01: 10:00:00 via INTRAVENOUS
  Filled 2017-04-01: qty 1000

## 2017-04-01 MED ORDER — SODIUM CHLORIDE 0.9 % IV SOLN
Freq: Once | INTRAVENOUS | Status: AC
  Administered 2017-04-01: 10:00:00 via INTRAVENOUS
  Filled 2017-04-01: qty 1000

## 2017-04-01 MED ORDER — PALONOSETRON HCL INJECTION 0.25 MG/5ML
0.2500 mg | Freq: Once | INTRAVENOUS | Status: AC
Start: 2017-04-01 — End: 2017-04-01
  Administered 2017-04-01: 0.25 mg via INTRAVENOUS
  Filled 2017-04-01: qty 5

## 2017-04-01 MED ORDER — SODIUM CHLORIDE 0.9 % IV SOLN
Freq: Once | INTRAVENOUS | Status: AC
  Administered 2017-04-01: 12:00:00 via INTRAVENOUS
  Filled 2017-04-01: qty 5

## 2017-04-01 MED ORDER — GLEEVEC 400 MG PO TABS: 400.0000 mg | ORAL_TABLET | Freq: Every day | ORAL | 0 refills | Status: DC

## 2017-04-01 MED ORDER — SODIUM CHLORIDE 0.9% FLUSH
10.0000 mL | Freq: Once | INTRAVENOUS | Status: AC
  Administered 2017-04-01: 10 mL via INTRAVENOUS
  Filled 2017-04-01: qty 10

## 2017-04-01 MED ORDER — SODIUM CHLORIDE 0.9 % IV SOLN
40.0000 mg/m2 | Freq: Once | INTRAVENOUS | Status: AC
  Administered 2017-04-01: 120 mg via INTRAVENOUS
  Filled 2017-04-01: qty 120

## 2017-04-01 NOTE — Progress Notes (Signed)
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 Excisional left neck node biopsy on 02/17/2017 revealed metastatic squamous cell carcinoma, P16 positive. HPV in situ hybridizationwas positive (16/18). EBV ISH was negative. Galactose alpha 1,3 was negative.  Neck CT on 03/25/2018revealed bulky (left >right) cervical lymphadenopathy. The largest nodes wereat the left level II station measuring up to 4.4 cm long axis (2.5-3 cm short axis). There were smaller but abnormally rounded lymph nodes throughout the neck elsewhere. There were nocystic or necrotic nodes. There was superimposed nonspecific appearing inflammatory process at the right level 1B nodal station, with associated overlying cellulitis. There was no associated fluid collection.  Chest CT on 03/26/2018revealed scattered upper normal to mildly enlarged lymph nodes in the mediastinum and upper abdomen come nonspecific for reactive versus neoplastic etiology. An index right hilar lymph node measures 1.1 cm in short axis.  PET scan on 04/09/2018revealed focal hypermetabolism in the left posterior oropharynx centered at the left base of tongue with associated asymmetric soft tissue fullness on the CT images, compatible with primary oropharyngeal carcinoma. There was hypermetabolic left level II cervical nodal metastases. There was no hypermetabolic contralateral cervical nodal  metastases or distant metastatic disease. Clinical stageis T2N1M0.  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 Tryptase was 32.3 (2.2-13.2) on 07/29/2016. He is on several medications: cetirizine (Zyrtec), cimetidine (Tagamet), cromolyn (Gastrocrom), and Gleevec(400 mg a day). He uses an epinephrine pen 3-4 times a year.  He was on a trial ofinterferonin early 2016 x 72month. Interferon was discontinued in 07/2015. Interferon did not improve his symptoms. He experienceduncontrolled diabetes and elevated liver function tests with interferon.  [facility] [place]. There were small poorly defined areas of apparent spindle cells which did not stained positive for mastcells. CK mutation was negative. There were rare atypical cells expressing CD-117 and CD-25. The cells represented approximately 0.1% of the leukocytes. [facility] [place] evidence of persistent systemic mastocytosis (5-10% of cellularity). There was no increase in blast or overt dyspoiesis. There was moderate reticulin fibrosis. Storage iron was present. There were no ring sideroblasts. Flow cytometry revealed no phenotypic evidence of lymphoma or increased blasts. Cytogenetics were normal (46, XY). There was no evidence of KIT(D816V) point mutation by PCR.   Neck CT on 03/25/2018revealed bulky (left >right) cervical lymphadenopathy. The largest nodes wereat the left level II station measuring up to 4.4 cm long axis (2.5-3 cm short axis). There were smaller but abnormally rounded lymph nodes throughout the neck elsewhere. There were nocystic or necrotic nodes. There was superimposed nonspecific appearing inflammatory process at the right level 1B nodal station, with associated overlying  cellulitis. There was no associated fluid collection.  Chest CT on 03/26/2018revealed scattered upper normal to mildly enlarged lymph nodes in the mediastinum and upper abdomen come nonspecific for reactive versus neoplastic etiology. An index  right hilar lymph node measures 1.1 cm in short axis.  He has osteopeniaon bone density study in 2014. He is on calcium and vitamin D.  He began radiationon 03/08/2017.  He began weekly cisplatin (40 mg/m2) on 03/11/2017.  Interval history- he received RT on Monday and Tuesday this week but was stopped last 2 days because of throat pain and mucositis. He will likely resume chemo on Monday. He is taking sips of water and able to eat small meals although reports no taste sensation. He has been using his percocet 3 times a day but feels he needs it more frequently. Weight has been stable  ECOG PS- 0 Pain scale- 5 Opioid associated constipation- no  Review of systems- Review of Systems  Constitutional: Negative for chills, fever, malaise/fatigue and weight loss.  HENT: Negative for congestion, ear discharge and nosebleeds.        Throat pain  Eyes: Negative for blurred vision.  Respiratory: Negative for cough, hemoptysis, sputum production, shortness of breath and wheezing.   Cardiovascular: Negative for chest pain, palpitations, orthopnea and claudication.  Gastrointestinal: Negative for abdominal pain, blood in stool, constipation, diarrhea, heartburn, melena, nausea and vomiting.  Genitourinary: Negative for dysuria, flank pain, frequency, hematuria and urgency.  Musculoskeletal: Negative for back pain, joint pain and myalgias.  Skin: Negative for rash.  Neurological: Negative for dizziness, tingling, focal weakness, seizures, weakness and headaches.  Endo/Heme/Allergies: Does not bruise/bleed easily.  Psychiatric/Behavioral: Negative for depression and suicidal ideas. The patient does not have insomnia.        No Known  Allergies   Past Medical History:  Diagnosis Date  . COPD (chronic obstructive pulmonary disease) ([place])   . Depression   . Diabetes mellitus without complication ([place])   . GERD (gastroesophageal reflux disease)   . Hypertension   . Neuropathy    from knees down, "tingling" in hands  . S/P insertion of spinal cord stimulator    (x2)  . Sleep apnea   . Systemic mastocytosis    Per pt     Past Surgical History:  Procedure Laterality Date  . cataract surgery  08/06/2014  . cataract surgery  11/05/2014  . MASS BIOPSY Left 02/17/2017   Procedure: NECK MASS BIOPSY;  Surgeon: Margaretha Sheffield, MD;  Location: [place];  Service: ENT;  Laterality: Left;  NEED HARMONIC SCAPLE diabetic - insulin and oral meds sleep apnea  . PORTA CATH INSERTION N/A 02/22/2017   Procedure: Glori Luis Cath Insertion;  Surgeon: Algernon Huxley, MD;  Location: [place] [facility];  Service: Cardiovascular;  Laterality: N/A;  . SEPTOPLASTY  12/15/2011  . Spinal implants  10/06/2011  . TYMPANOSTOMY TUBE PLACEMENT Right 11/03/2011  . X-STOP IMPLANTATION Right 07/25/2013    Social History   Social History  . Marital status: Married    Spouse name: N/A  . Number of children: N/A  . Years of education: N/A   Occupational History  . Not on file.   Social History Main Topics  . Smoking status: Former Smoker    Quit date: 11/22/2014  . Smokeless tobacco: Never Used     Comment: Patient smoked 24 years - 2 pks a day  . Alcohol use No  . Drug use: No  . Sexual activity: Not on file   Other Topics Concern  . Not on file   Social History Narrative  . No narrative on file    Family History  Problem Relation Age of Onset  . Diabetes Father   .  Hypertension Father   . Diabetes Paternal Grandmother      Current Outpatient Prescriptions:  .  albuterol (PROVENTIL HFA;VENTOLIN HFA) 108 (90 Base) MCG/ACT inhaler, Inhale 2 puffs into the lungs every 6 (six) hours as needed for wheezing. , Disp: , Rfl:  .   amitriptyline (ELAVIL) 25 MG tablet, Take 25 mg by mouth daily., Disp: , Rfl:  .  amLODipine (NORVASC) 5 MG tablet, Take 5 mg by mouth daily., Disp: , Rfl:  .  aspirin 500 MG tablet, Take 500 mg by mouth 2 (two) times daily., Disp: , Rfl:  .  atorvastatin (LIPITOR) 20 MG tablet, Take 20 mg by mouth daily. , Disp: , Rfl:  .  Calcium Citrate 200 MG TABS, Take 1,000 mg by mouth 2 (two) times daily. , Disp: , Rfl:  .  Cetirizine HCl 10 MG CAPS, Take 10 mg by mouth 2 (two) times daily. , Disp: , Rfl:  .  cimetidine (TAGAMET) 400 MG tablet, Take 400 mg by mouth 2 (two) times daily. , Disp: , Rfl:  .  cromolyn (GASTROCROM) 100 MG/5ML solution, Take 200 mg by mouth 4 (four) times daily -  before meals and at bedtime. , Disp: , Rfl:  .  dexamethasone (DECADRON) 4 MG tablet, Take 2 tablets by mouth once a day on the day after chemotherapy and then take 2 tablets two times a day for 2 days. Take with food., Disp: 30 tablet, Rfl: 1 .  diphenhydrAMINE (BENADRYL) 50 MG capsule, Take 50 mg by mouth 2 (two) times daily. , Disp: , Rfl:  .  EPINEPHrine 0.3 mg/0.3 mL IJ SOAJ injection, Inject 0.3 mg into the muscle once. , Disp: , Rfl:  .  fluticasone (FLONASE) 50 MCG/ACT nasal spray, Place 2 sprays into both nostrils daily. , Disp: , Rfl:  .  gabapentin (NEURONTIN) 600 MG tablet, Take 1,200 mg by mouth 3 (three) times daily. , Disp: , Rfl:  .  glimepiride (AMARYL) 4 MG tablet, Take 4 mg by mouth 2 (two) times daily. , Disp: , Rfl:  .  glucagon (GLUCAGON EMERGENCY) 1 MG injection, Inject 1 mg into the muscle once as needed. , Disp: , Rfl:  .  imatinib (GLEEVEC) 400 MG tablet, Take 1 tablet (400 mg total) by mouth daily., Disp: 30 tablet, Rfl: 0 .  Insulin Glargine (LANTUS [place]), Inject 90 Units into the skin daily. , Disp: , Rfl:  .  insulin regular (NOVOLIN R,HUMULIN R) 100 units/mL injection, Inject 80 Units into the skin 3 (three) times daily as needed. , Disp: , Rfl:  .  Insulin Syringe-Needle U-100 (B-D INS SYR  ULTRAFINE .3CC/30G) 30G X 1/2" 0.3 ML MISC, Use 1 Syringe as directed., Disp: , Rfl:  .  Lancets 28G MISC, Use 1 each 3 (three) times daily. Use as instructed., Disp: , Rfl:  .  lidocaine-prilocaine (EMLA) cream, Apply 1 application topically as needed., Disp: 30 g, Rfl: 0 .  LORazepam (ATIVAN) 0.5 MG tablet, Take 1 tablet (0.5 mg total) by mouth every 6 (six) hours as needed (Nausea or vomiting)., Disp: 30 tablet, Rfl: 0 .  losartan (COZAAR) 100 MG tablet, Take 100 mg by mouth daily. , Disp: , Rfl:  .  magic mouthwash w/lidocaine SOLN, Take 5 mLs by mouth 4 (four) times daily. Swish and spit, Disp: 240 mL, Rfl: 0 .  Melatonin 10 MG TABS, Take 10 mg by mouth at bedtime. , Disp: , Rfl:  .  metFORMIN (GLUCOPHAGE) 1000 MG tablet, Take 2,000  mg by mouth daily with breakfast. , Disp: , Rfl:  .  Multiple Vitamin (MULTIVITAMIN) tablet, Take 1 tablet by mouth daily., Disp: , Rfl:  .  omeprazole (PRILOSEC) 20 MG capsule, Take 20 mg by mouth 2 (two) times daily before a meal. , Disp: , Rfl:  .  ondansetron (ZOFRAN) 8 MG tablet, Take 1 tablet (8 mg total) by mouth 2 (two) times daily as needed. Start on the third day after chemotherapy., Disp: 30 tablet, Rfl: 1 .  oxyCODONE-acetaminophen (PERCOCET/ROXICET) 5-325 MG tablet, Take 1 tablet by mouth every 6 (six) hours as needed for severe pain., Disp: 30 tablet, Rfl: 0 .  phenazopyridine (PYRIDIUM) 200 MG tablet, Take 200 mg by mouth 2 (two) times daily. , Disp: , Rfl:  .  sucralfate (CARAFATE) 1 g tablet, Take 1 tablet (1 g total) by mouth 3 (three) times daily. Dissolve in 2-3 tbsp warm water, swish and swallow., Disp: 90 tablet, Rfl: 3 .  tamsulosin (FLOMAX) 0.4 MG CAPS capsule, Take 0.4 mg by mouth daily. , Disp: , Rfl:  .  tiZANidine (ZANAFLEX) 4 MG capsule, Take 4 mg by mouth daily. , Disp: , Rfl:  .  traMADol (ULTRAM) 50 MG tablet, Take 50 mg by mouth 3 (three) times daily. , Disp: , Rfl:  .  zolpidem (AMBIEN CR) 12.5 MG CR tablet, Take 12.5 mg by mouth at  bedtime. , Disp: , Rfl:  No current facility-administered medications for this visit.   Facility-Administered Medications Ordered in Other Visits:  .  heparin lock flush 100 unit/mL, 500 Units, Intravenous, Once, Lequita Asal, MD  Physical exam:  Vitals:   04/01/17 0935 04/01/17 0940  BP:  (!) 144/84  Pulse:  (!) 103  Resp:  20  Temp:  98.4 F (36.9 C)  TempSrc:  Tympanic  Weight: (!) 361 lb 3.2 oz (163.8 kg)    Physical Exam  Constitutional: He is oriented to person, place, and time.  Obese, no acute distress. Uses a cane to ambulate  HENT:  Head: Normocephalic and atraumatic.  Tongue is cracked but moist. Evidence of mucositis over posterior oropharynx more pronounced over soft palate and uvula. No ulcerations. Thick secretions over uvula.   Eyes: EOM are normal. Pupils are equal, round, and reactive to light.  Neck: Normal range of motion.  Cardiovascular: Normal rate, regular rhythm and normal heart sounds.   Pulmonary/Chest: Effort normal and breath sounds normal.  Abdominal: Soft. Bowel sounds are normal.  Neurological: He is alert and oriented to person, place, and time.  Skin: Skin is warm and dry.     CMP Latest Ref Rng & Units 03/25/2017  Glucose 65 - 99 mg/dL 163(H)  BUN 6 - 20 mg/dL 16  Creatinine 0.61 - 1.24 mg/dL 0.86  Sodium 135 - 145 mmol/L 137  Potassium 3.5 - 5.1 mmol/L 4.2  Chloride 101 - 111 mmol/L 102  CO2 22 - 32 mmol/L 27  Calcium 8.9 - 10.3 mg/dL 9.0  Total Protein 6.5 - 8.1 g/dL 6.7  Total Bilirubin 0.3 - 1.2 mg/dL 1.0  Alkaline Phos 38 - 126 U/L 66  AST 15 - 41 U/L 46(H)  ALT 17 - 63 U/L 84(H)   CBC Latest Ref Rng & Units 03/25/2017  WBC 3.8 - 10.6 K/uL 14.7(H)  Hemoglobin 13.0 - 18.0 g/dL 10.4(L)  Hematocrit 40.0 - 52.0 % 31.7(L)  Platelets 150 - 440 K/uL 226    No images are attached to the encounter.  No results found.  Assessment and plan- Patient is a 46 y.o. male with h/o systemmic mastocytosis now diagnosed with Stage I  HPV positive SCC of oropharynx left tongue base T2N1M0 getting weekly cisplatin/RT week 4 today  Counts ok to proceed with cycle #4 of cisplatin. He will see Dr. Rogue Bussing next week prior to cycle 5 of chemotherapy with cbc, cmp and mag. Although he did not receive RT last 2 days, he did receive 1st 2 days this week and will likely get next week. Hence proceed with chemo today. Decide about next dose based on RT schedule  Mucositis- getting worse. continue magic mouthwash. Increase percocet to Q4 prn not to exceed tylenol by >3 gm total. Encourage PO intake and hydration  Systemic mastocytosis- gleevec refill given today   Visit Diagnosis 1. Neoplasm related pain   2. Squamous cell carcinoma of base of tongue (HCC)   3. Encounter for antineoplastic chemotherapy      Dr. Randa Evens, MD, MPH [facility] Pager- [phone] 04/01/2017 10:22 AM

## 2017-04-01 NOTE — Progress Notes (Signed)
Patient reports "pulling neck"yesterday but does not think it is a muscle, hurts deep and increases with swallowing. Needs refill on Gleevac, overall sense of blah.

## 2017-04-04 ENCOUNTER — Other Ambulatory Visit: Payer: Self-pay | Admitting: *Deleted

## 2017-04-04 ENCOUNTER — Ambulatory Visit
Admission: RE | Admit: 2017-04-04 | Discharge: 2017-04-04 | Disposition: A | Discharge status: Home / Self Care | Payer: BLUE CROSS/BLUE SHIELD | Source: Ambulatory Visit | Attending: Radiation Oncology | Admitting: Radiation Oncology

## 2017-04-04 DIAGNOSIS — Z510 Encounter for antineoplastic radiation therapy: Secondary | ICD-10-CM | POA: Diagnosis not present

## 2017-04-04 MED ORDER — FLUCONAZOLE 100 MG PO TABS: 100.0000 mg | ORAL_TABLET | Freq: Every day | ORAL | 0 refills | Status: DC

## 2017-04-05 ENCOUNTER — Ambulatory Visit: Payer: BLUE CROSS/BLUE SHIELD

## 2017-04-05 ENCOUNTER — Other Ambulatory Visit: Payer: Self-pay | Admitting: *Deleted

## 2017-04-05 ENCOUNTER — Ambulatory Visit: Payer: BLUE CROSS/BLUE SHIELD | Admitting: Hematology and Oncology

## 2017-04-05 ENCOUNTER — Other Ambulatory Visit: Payer: BLUE CROSS/BLUE SHIELD

## 2017-04-05 ENCOUNTER — Ambulatory Visit
Admission: RE | Admit: 2017-04-05 | Discharge: 2017-04-05 | Disposition: A | Discharge status: Home / Self Care | Payer: BLUE CROSS/BLUE SHIELD | Source: Ambulatory Visit | Attending: Radiation Oncology | Admitting: Radiation Oncology

## 2017-04-05 DIAGNOSIS — Z510 Encounter for antineoplastic radiation therapy: Secondary | ICD-10-CM | POA: Diagnosis not present

## 2017-04-06 ENCOUNTER — Ambulatory Visit
Admission: RE | Admit: 2017-04-06 | Discharge: 2017-04-06 | Disposition: A | Discharge status: Home / Self Care | Payer: BLUE CROSS/BLUE SHIELD | Source: Ambulatory Visit | Attending: Radiation Oncology | Admitting: Radiation Oncology

## 2017-04-06 DIAGNOSIS — Z510 Encounter for antineoplastic radiation therapy: Secondary | ICD-10-CM | POA: Diagnosis not present

## 2017-04-07 ENCOUNTER — Ambulatory Visit
Admission: RE | Admit: 2017-04-07 | Discharge: 2017-04-07 | Disposition: A | Discharge status: Home / Self Care | Payer: BLUE CROSS/BLUE SHIELD | Source: Ambulatory Visit | Attending: Radiation Oncology | Admitting: Radiation Oncology

## 2017-04-07 DIAGNOSIS — Z510 Encounter for antineoplastic radiation therapy: Secondary | ICD-10-CM | POA: Diagnosis not present

## 2017-04-08 ENCOUNTER — Other Ambulatory Visit: Payer: Self-pay | Admitting: Hematology and Oncology

## 2017-04-08 ENCOUNTER — Inpatient Hospital Stay: Payer: BLUE CROSS/BLUE SHIELD

## 2017-04-08 ENCOUNTER — Inpatient Hospital Stay (HOSPITAL_BASED_OUTPATIENT_CLINIC_OR_DEPARTMENT_OTHER): Payer: BLUE CROSS/BLUE SHIELD | Admitting: Internal Medicine

## 2017-04-08 ENCOUNTER — Ambulatory Visit
Admission: RE | Admit: 2017-04-08 | Discharge: 2017-04-08 | Disposition: A | Discharge status: Home / Self Care | Payer: BLUE CROSS/BLUE SHIELD | Source: Ambulatory Visit | Attending: Radiation Oncology | Admitting: Radiation Oncology

## 2017-04-08 VITALS — BP 132/77 | HR 99 | Temp 97.8°F | Resp 16 | Wt 364.4 lb

## 2017-04-08 DIAGNOSIS — R591 Generalized enlarged lymph nodes: Secondary | ICD-10-CM

## 2017-04-08 DIAGNOSIS — Z79899 Other long term (current) drug therapy: Secondary | ICD-10-CM

## 2017-04-08 DIAGNOSIS — C01 Malignant neoplasm of base of tongue: Secondary | ICD-10-CM

## 2017-04-08 DIAGNOSIS — G629 Polyneuropathy, unspecified: Secondary | ICD-10-CM

## 2017-04-08 DIAGNOSIS — B370 Candidal stomatitis: Secondary | ICD-10-CM | POA: Diagnosis not present

## 2017-04-08 DIAGNOSIS — K219 Gastro-esophageal reflux disease without esophagitis: Secondary | ICD-10-CM

## 2017-04-08 DIAGNOSIS — Z7982 Long term (current) use of aspirin: Secondary | ICD-10-CM

## 2017-04-08 DIAGNOSIS — J449 Chronic obstructive pulmonary disease, unspecified: Secondary | ICD-10-CM

## 2017-04-08 DIAGNOSIS — Z510 Encounter for antineoplastic radiation therapy: Secondary | ICD-10-CM | POA: Diagnosis not present

## 2017-04-08 DIAGNOSIS — D4702 Systemic mastocytosis: Secondary | ICD-10-CM | POA: Diagnosis not present

## 2017-04-08 DIAGNOSIS — K1230 Oral mucositis (ulcerative), unspecified: Secondary | ICD-10-CM

## 2017-04-08 DIAGNOSIS — K1233 Oral mucositis (ulcerative) due to radiation: Secondary | ICD-10-CM | POA: Diagnosis not present

## 2017-04-08 DIAGNOSIS — J029 Acute pharyngitis, unspecified: Secondary | ICD-10-CM

## 2017-04-08 DIAGNOSIS — M8580 Other specified disorders of bone density and structure, unspecified site: Secondary | ICD-10-CM

## 2017-04-08 DIAGNOSIS — E119 Type 2 diabetes mellitus without complications: Secondary | ICD-10-CM

## 2017-04-08 DIAGNOSIS — Z794 Long term (current) use of insulin: Secondary | ICD-10-CM

## 2017-04-08 DIAGNOSIS — G893 Neoplasm related pain (acute) (chronic): Secondary | ICD-10-CM

## 2017-04-08 DIAGNOSIS — Z923 Personal history of irradiation: Secondary | ICD-10-CM

## 2017-04-08 DIAGNOSIS — H9202 Otalgia, left ear: Secondary | ICD-10-CM

## 2017-04-08 DIAGNOSIS — I10 Essential (primary) hypertension: Secondary | ICD-10-CM

## 2017-04-08 DIAGNOSIS — F329 Major depressive disorder, single episode, unspecified: Secondary | ICD-10-CM

## 2017-04-08 DIAGNOSIS — Z87891 Personal history of nicotine dependence: Secondary | ICD-10-CM

## 2017-04-08 LAB — COMPREHENSIVE METABOLIC PANEL
ALT: 51 U/L (ref 17–63)
AST: 30 U/L (ref 15–41)
Albumin: 3.6 g/dL (ref 3.5–5.0)
Alkaline Phosphatase: 70 U/L (ref 38–126)
Anion gap: 4 — ABNORMAL LOW (ref 5–15)
BUN: 11 mg/dL (ref 6–20)
CO2: 28 mmol/L (ref 22–32)
Calcium: 8.5 mg/dL — ABNORMAL LOW (ref 8.9–10.3)
Chloride: 105 mmol/L (ref 101–111)
Creatinine, Ser: 0.69 mg/dL (ref 0.61–1.24)
GFR calc Af Amer: >60 mL/min (ref >60)
GFR calc non Af Amer: >60 mL/min (ref >60)
Glucose, Bld: 129 mg/dL — ABNORMAL HIGH (ref 65–99)
Potassium: 3.7 mmol/L (ref 3.5–5.1)
Sodium: 137 mmol/L (ref 135–145)
Total Bilirubin: 0.7 mg/dL (ref 0.3–1.2)
Total Protein: 6.4 g/dL — ABNORMAL LOW (ref 6.5–8.1)

## 2017-04-08 LAB — CBC WITH DIFFERENTIAL/PLATELET
Basophils Absolute: 0 10*3/uL (ref 0–0.1)
Basophils Relative: 0 %
Eosinophils Absolute: 0.1 10*3/uL (ref 0–0.7)
Eosinophils Relative: 1 %
HCT: 27 % — ABNORMAL LOW (ref 40.0–52.0)
Hemoglobin: 9 g/dL — ABNORMAL LOW (ref 13.0–18.0)
Lymphocytes Relative: 16 %
Lymphs Abs: 0.9 10*3/uL — ABNORMAL LOW (ref 1.0–3.6)
MCH: 31.5 pg (ref 26.0–34.0)
MCHC: 33.2 g/dL (ref 32.0–36.0)
MCV: 95 fL (ref 80.0–100.0)
Monocytes Absolute: 0.3 10*3/uL (ref 0.2–1.0)
Monocytes Relative: 6 %
Neutro Abs: 4.3 10*3/uL (ref 1.4–6.5)
Neutrophils Relative %: 77 %
Platelets: 156 10*3/uL (ref 150–440)
RBC: 2.85 MIL/uL — ABNORMAL LOW (ref 4.40–5.90)
RDW: 19.2 % — ABNORMAL HIGH (ref 11.5–14.5)
WBC: 5.6 10*3/uL (ref 3.8–10.6)

## 2017-04-08 LAB — MAGNESIUM: Magnesium: 1.6 mg/dL — ABNORMAL LOW (ref 1.7–2.4)

## 2017-04-08 MED ORDER — PALONOSETRON HCL INJECTION 0.25 MG/5ML
0.2500 mg | Freq: Once | INTRAVENOUS | Status: AC
  Administered 2017-04-08: 0.25 mg via INTRAVENOUS
  Filled 2017-04-08: qty 5

## 2017-04-08 MED ORDER — POTASSIUM CHLORIDE 2 MEQ/ML IV SOLN
Freq: Once | INTRAVENOUS | Status: AC
  Administered 2017-04-08: 11:00:00 via INTRAVENOUS
  Filled 2017-04-08: qty 1000

## 2017-04-08 MED ORDER — SODIUM CHLORIDE 0.9 % IV SOLN
40.0000 mg/m2 | Freq: Once | INTRAVENOUS | Status: AC
  Administered 2017-04-08: 120 mg via INTRAVENOUS
  Filled 2017-04-08: qty 100

## 2017-04-08 MED ORDER — SODIUM CHLORIDE 0.9 % IV SOLN
Freq: Once | INTRAVENOUS | Status: AC
  Administered 2017-04-08: 11:00:00 via INTRAVENOUS
  Filled 2017-04-08: qty 1000

## 2017-04-08 MED ORDER — HEPARIN SOD (PORK) LOCK FLUSH 100 UNIT/ML IV SOLN
500.0000 [IU] | Freq: Once | INTRAVENOUS | Status: AC
  Administered 2017-04-08: 500 [IU] via INTRAVENOUS
  Filled 2017-04-08: qty 5

## 2017-04-08 MED ORDER — SODIUM CHLORIDE 0.9% FLUSH
10.0000 mL | INTRAVENOUS | Status: DC | PRN
  Administered 2017-04-08: 10 mL via INTRAVENOUS
  Filled 2017-04-08: qty 10

## 2017-04-08 MED ORDER — SODIUM CHLORIDE 0.9 % IV SOLN
Freq: Once | INTRAVENOUS | Status: AC
  Administered 2017-04-08: 13:00:00 via INTRAVENOUS
  Filled 2017-04-08: qty 5

## 2017-04-08 MED ORDER — DOXEPIN HCL 10 MG/ML PO CONC
ORAL | 1 refills | Status: AC
Start: 2017-04-08 — End: ?

## 2017-04-08 NOTE — Progress Notes (Signed)
Patient here today for follow up.  Patient c/o red irritation around neck from radiation.

## 2017-04-08 NOTE — Progress Notes (Signed)
[place] OFFICE PROGRESS NOTE  Patient Care Team: [place] Pink, MD as PCP - General (Family Medicine)  Cancer Staging Squamous cell carcinoma of base of tongue [facility]) Staging form: Pharynx - HPV-Mediated Oropharynx, AJCC 8th Edition - Clinical stage from 02/28/2017: Stage I (cT2, cN1, cM0, p16: Positive) - Unsigned     No history exists.     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 Patient is currently in the fifth week of treatment.  Patient had a break in his treatment last week- because of radiation mucositis. Patient is currently back on radiation this week. He states his pain in his mouth is improved. He is on Percocet. He was treated for thrush by radiation oncology recently. Denies any outside the ordinary nausea vomiting. Denies tingling or numbness.   REVIEW OF SYSTEMS:  A complete 10 point review of system is done which is negative except mentioned above/history of present illness.   PAST MEDICAL HISTORY :  Past Medical History:  Diagnosis Date  . COPD (chronic obstructive pulmonary disease) ([place])   . Depression   . Diabetes mellitus without complication ([place])   . GERD (gastroesophageal reflux disease)   . Hypertension   . Neuropathy    from knees down, "tingling" in hands  . S/P insertion of spinal cord stimulator    (x2)  . Sleep apnea   . Systemic mastocytosis    Per pt    PAST SURGICAL HISTORY :   Past Surgical History:  Procedure Laterality Date  . cataract surgery  08/06/2014  . cataract surgery  11/05/2014  . MASS BIOPSY Left 02/17/2017   Procedure: NECK MASS BIOPSY;  Surgeon: Margaretha Sheffield, MD;  Location: [place];  Service: ENT;  Laterality:  Left;  NEED HARMONIC SCAPLE diabetic - insulin and oral meds sleep apnea  . PORTA CATH INSERTION N/A 02/22/2017   Procedure: Glori Luis Cath Insertion;  Surgeon: Algernon Huxley, MD;  Location: [facility] [facility];  Service: Cardiovascular;  Laterality: N/A;  . SEPTOPLASTY  12/15/2011  . Spinal implants  10/06/2011  . TYMPANOSTOMY TUBE PLACEMENT Right 11/03/2011  . X-STOP IMPLANTATION Right 07/25/2013    FAMILY HISTORY :   Family History  Problem Relation Age of Onset  . Diabetes Father   . Hypertension Father   . Diabetes Paternal Grandmother     SOCIAL HISTORY:   Social History  Substance Use Topics  . Smoking status: Former Smoker    Quit date: 11/22/2014  . Smokeless tobacco: Never Used     Comment: Patient smoked 24 years - 2 pks a day  . Alcohol use No    ALLERGIES:  has No Known Allergies.  MEDICATIONS:  Current Outpatient Prescriptions  Medication Sig Dispense Refill  . amitriptyline (ELAVIL) 25 MG tablet Take 25 mg by mouth daily.    Marland Kitchen amLODipine (NORVASC) 5 MG tablet Take 5 mg by mouth daily.    Marland Kitchen aspirin EC 81 MG tablet Take 81 mg by mouth 2 (two) times daily.    Marland Kitchen atorvastatin (LIPITOR) 20 MG tablet Take 20 mg by mouth daily.     . Calcium Citrate 200 MG TABS Take 1,000 mg by mouth 2 (two) times daily.     Marland Kitchen  Cetirizine HCl 10 MG CAPS Take 10 mg by mouth 2 (two) times daily.     . cimetidine (TAGAMET) 400 MG tablet Take 400 mg by mouth 2 (two) times daily.     . cromolyn (GASTROCROM) 100 MG/5ML solution Take 200 mg by mouth 4 (four) times daily -  before meals and at bedtime.     Marland Kitchen dexamethasone (DECADRON) 4 MG tablet Take 2 tablets by mouth once a day on the day after chemotherapy and then take 2 tablets two times a day for 2 days. Take with food. 30 tablet 1  . diphenhydrAMINE (BENADRYL) 50 MG capsule Take 50 mg by mouth 2 (two) times daily.     Marland Kitchen EPINEPHrine 0.3 mg/0.3 mL IJ SOAJ injection Inject 0.3 mg into the muscle once.     . fluconazole (DIFLUCAN) 100 MG tablet  Take 1 tablet (100 mg total) by mouth daily. 5 tablet 0  . fluticasone (FLONASE) 50 MCG/ACT nasal spray Place 2 sprays into both nostrils daily.     Marland Kitchen gabapentin (NEURONTIN) 600 MG tablet Take 1,200 mg by mouth 3 (three) times daily.     Marland Kitchen GLEEVEC 400 MG tablet Take 1 tablet (400 mg total) by mouth daily. Take with meals and large glass of water.Caution:Chemotherapy. 90 tablet 0  . glimepiride (AMARYL) 4 MG tablet Take 4 mg by mouth 2 (two) times daily.     Marland Kitchen glucagon (GLUCAGON EMERGENCY) 1 MG injection Inject 1 mg into the muscle once as needed.     . Insulin Glargine (LANTUS [place]) Inject 90 Units into the skin daily.     . insulin regular (NOVOLIN R,HUMULIN R) 100 units/mL injection Inject 80 Units into the skin 3 (three) times daily as needed.     . lidocaine-prilocaine (EMLA) cream Apply 1 application topically as needed. 30 g 0  . LORazepam (ATIVAN) 0.5 MG tablet Take 1 tablet (0.5 mg total) by mouth every 6 (six) hours as needed (Nausea or vomiting). 30 tablet 0  . losartan (COZAAR) 100 MG tablet Take 100 mg by mouth daily.     . magic mouthwash w/lidocaine SOLN Take 5 mLs by mouth 4 (four) times daily. Swish and spit 240 mL 0  . metFORMIN (GLUCOPHAGE) 1000 MG tablet Take 2,000 mg by mouth daily with breakfast.     . Multiple Vitamin (MULTIVITAMIN) tablet Take 1 tablet by mouth daily.    Marland Kitchen omeprazole (PRILOSEC) 20 MG capsule Take 20 mg by mouth 2 (two) times daily before a meal.     . ondansetron (ZOFRAN) 8 MG tablet Take 1 tablet (8 mg total) by mouth 2 (two) times daily as needed. Start on the third day after chemotherapy. 30 tablet 1  . oxyCODONE-acetaminophen (PERCOCET/ROXICET) 5-325 MG tablet Take 1 tablet by mouth every 4 (four) hours as needed for severe pain. Not to exceed 3 grams of tylenol per day. 60 tablet 0  . phenazopyridine (PYRIDIUM) 200 MG tablet Take 200 mg by mouth 2 (two) times daily.     . sucralfate (CARAFATE) 1 g tablet Take 1 tablet (1 g total) by mouth 3 (three) times  daily. Dissolve in 2-3 tbsp warm water, swish and swallow. 90 tablet 3  . tamsulosin (FLOMAX) 0.4 MG CAPS capsule Take 0.4 mg by mouth daily.     Marland Kitchen tiZANidine (ZANAFLEX) 4 MG capsule Take 4 mg by mouth daily.     . traMADol (ULTRAM) 50 MG tablet Take 50 mg by mouth 3 (three) times daily.     Marland Kitchen  zolpidem (AMBIEN CR) 12.5 MG CR tablet Take 12.5 mg by mouth at bedtime.     Marland Kitchen doxepin (SINEQUAN) 10 MG/ML solution Take 2.5 ml [25mg ] in 5 ml of water; Oral rinse for 1 minute; and spit out once a day. 120 mL 1  . Insulin Syringe-Needle U-100 (B-D INS SYR ULTRAFINE .3CC/30G) 30G X 1/2" 0.3 ML MISC Use 1 Syringe as directed.    . Lancets 28G MISC Use 1 each 3 (three) times daily. Use as instructed.     No current facility-administered medications for this visit.     PHYSICAL EXAMINATION: ECOG PERFORMANCE STATUS: 1 - Symptomatic but completely ambulatory  BP 132/77 (BP Location: Right Arm, Patient Position: Sitting)   Pulse 99   Temp 97.8 F (36.6 C) (Tympanic)   Resp 16   Wt (!) 364 lb 6 oz (165.3 kg)   BMI 45.54 kg/m   Filed Weights   04/08/17 0928  Weight: (!) 364 lb 6 oz (165.3 kg)    GENERAL: Well-nourished well-developed; Alert, no distress and comfortable.   Accompanied by his wife. EYES: no pallor or icterus OROPHARYNX: Positive for thrush.; Ulceration improved. NECK: supple, no masses felt LYMPH:  no palpable lymphadenopathy in the cervical, axillary or inguinal regions LUNGS: clear to auscultation and  No wheeze or crackles HEART/CVS: regular rate & rhythm and no murmurs; No lower extremity edema ABDOMEN:abdomen soft, non-tender and normal bowel sounds Musculoskeletal:no cyanosis of digits and no clubbing  PSYCH: alert & oriented x 3 with fluent speech NEURO: no focal motor/sensory deficits SKIN:  no rashes or significant lesions  LABORATORY DATA:  I have reviewed the data as listed    Component Value Date/Time   NA 137 04/08/2017 0846   K 3.7 04/08/2017 0846   CL 105  04/08/2017 0846   CO2 28 04/08/2017 0846   GLUCOSE 129 (H) 04/08/2017 0846   BUN 11 04/08/2017 0846   CREATININE 0.69 04/08/2017 0846   CALCIUM 8.5 (L) 04/08/2017 0846   PROT 6.4 (L) 04/08/2017 0846   ALBUMIN 3.6 04/08/2017 0846   AST 30 04/08/2017 0846   ALT 51 04/08/2017 0846   ALKPHOS 70 04/08/2017 0846   BILITOT 0.7 04/08/2017 0846   GFRNONAA >60 04/08/2017 0846   GFRAA >60 04/08/2017 0846    No results found for: SPEP, UPEP  Lab Results  Component Value Date   WBC 5.6 04/08/2017   NEUTROABS 4.3 04/08/2017   HGB 9.0 (L) 04/08/2017   HCT 27.0 (L) 04/08/2017   MCV 95.0 04/08/2017   PLT 156 04/08/2017      Chemistry      Component Value Date/Time   NA 137 04/08/2017 0846   K 3.7 04/08/2017 0846   CL 105 04/08/2017 0846   CO2 28 04/08/2017 0846   BUN 11 04/08/2017 0846   CREATININE 0.69 04/08/2017 0846      Component Value Date/Time   CALCIUM 8.5 (L) 04/08/2017 0846   ALKPHOS 70 04/08/2017 0846   AST 30 04/08/2017 0846   ALT 51 04/08/2017 0846   BILITOT 0.7 04/08/2017 0846       RADIOGRAPHIC STUDIES: I have personally reviewed the radiological images as listed and agreed with the findings in the report. No results found.   ASSESSMENT & PLAN:  Squamous cell carcinoma of base of tongue (HCC) #  Squamous cell carcinoma base of tongue-  Stage I/p16 positive- currently on concurrent  Weekly cisplatin/ radiation [held last week]; again started this week RT again.  Patient tolerating chemotherapy  radiation fairly well- with expected side effects of mucositis [see discussion below]  #  Proceed with cycle #5 of weekly cisplatin today; labs acceptable except for anemia [hb 9.5]  # Nausea- G-1-  Continue when necessary antiemetics.  # Oral thrush- currently on Diflucan. Improved.  #  Radiation mucositis-G2 magic mouth wash; percocet 2 pills TID; doxepin rinse [new script given]  #  Systemic mastocytosis-  On Gleevec/ stable;  No exacerbation noted.   # labs/MD-  Dr.C/chemo.   No orders of the defined types were placed in this encounter.  All questions were answered. The patient knows to call the clinic with any problems, questions or concerns.      Cammie Sickle, MD 04/10/2017 5:06 PM

## 2017-04-08 NOTE — Assessment & Plan Note (Addendum)
#    Squamous cell carcinoma base of tongue-  Stage I/p16 positive- currently on concurrent  Weekly cisplatin/ radiation [held last week]; again started this week RT again.  Patient tolerating chemotherapy radiation fairly well- with expected side effects of mucositis [see discussion below]  #  Proceed with cycle #5 of weekly cisplatin today; labs acceptable except for anemia [hb 9.5]  # Nausea- G-1-  Continue when necessary antiemetics.  # Oral thrush- currently on Diflucan. Improved.  #  Radiation mucositis-G2 magic mouth wash; percocet 2 pills TID; doxepin rinse [new script given]  #  Systemic mastocytosis-  On Gleevec/ stable;  No exacerbation noted.   # labs/MD- Dr.C/chemo.

## 2017-04-10 ENCOUNTER — Ambulatory Visit: Payer: BLUE CROSS/BLUE SHIELD

## 2017-04-11 ENCOUNTER — Ambulatory Visit: Payer: BLUE CROSS/BLUE SHIELD

## 2017-04-11 ENCOUNTER — Ambulatory Visit
Admission: RE | Admit: 2017-04-11 | Discharge: 2017-04-11 | Disposition: A | Discharge status: Home / Self Care | Payer: BLUE CROSS/BLUE SHIELD | Source: Ambulatory Visit | Attending: Radiation Oncology | Admitting: Radiation Oncology

## 2017-04-11 DIAGNOSIS — Z510 Encounter for antineoplastic radiation therapy: Secondary | ICD-10-CM | POA: Diagnosis not present

## 2017-04-12 ENCOUNTER — Ambulatory Visit
Admission: RE | Admit: 2017-04-12 | Discharge: 2017-04-12 | Disposition: A | Discharge status: Home / Self Care | Payer: BLUE CROSS/BLUE SHIELD | Source: Ambulatory Visit | Attending: Radiation Oncology | Admitting: Radiation Oncology

## 2017-04-12 ENCOUNTER — Ambulatory Visit: Payer: BLUE CROSS/BLUE SHIELD

## 2017-04-12 ENCOUNTER — Ambulatory Visit: Payer: BLUE CROSS/BLUE SHIELD | Admitting: Hematology and Oncology

## 2017-04-12 ENCOUNTER — Other Ambulatory Visit: Payer: BLUE CROSS/BLUE SHIELD

## 2017-04-12 ENCOUNTER — Other Ambulatory Visit: Payer: Self-pay | Admitting: *Deleted

## 2017-04-12 DIAGNOSIS — C01 Malignant neoplasm of base of tongue: Secondary | ICD-10-CM

## 2017-04-12 DIAGNOSIS — Z510 Encounter for antineoplastic radiation therapy: Secondary | ICD-10-CM | POA: Diagnosis not present

## 2017-04-12 MED ORDER — DEXAMETHASONE 4 MG PO TABS: 4.0000 mg | ORAL_TABLET | Freq: Two times a day (BID) | ORAL | 0 refills | Status: DC

## 2017-04-13 ENCOUNTER — Ambulatory Visit: Payer: BLUE CROSS/BLUE SHIELD

## 2017-04-13 ENCOUNTER — Other Ambulatory Visit: Payer: Self-pay | Admitting: *Deleted

## 2017-04-13 DIAGNOSIS — C01 Malignant neoplasm of base of tongue: Secondary | ICD-10-CM

## 2017-04-13 DIAGNOSIS — Z5111 Encounter for antineoplastic chemotherapy: Secondary | ICD-10-CM

## 2017-04-14 ENCOUNTER — Ambulatory Visit: Payer: BLUE CROSS/BLUE SHIELD

## 2017-04-14 MED ORDER — OXYCODONE-ACETAMINOPHEN 5-325 MG PO TABS: 1.0000 | ORAL_TABLET | ORAL | 0 refills | Status: DC | PRN

## 2017-04-15 ENCOUNTER — Inpatient Hospital Stay: Payer: BLUE CROSS/BLUE SHIELD

## 2017-04-15 ENCOUNTER — Inpatient Hospital Stay: Payer: BLUE CROSS/BLUE SHIELD | Admitting: Hematology and Oncology

## 2017-04-15 ENCOUNTER — Ambulatory Visit: Payer: BLUE CROSS/BLUE SHIELD

## 2017-04-19 ENCOUNTER — Other Ambulatory Visit: Payer: Self-pay | Admitting: *Deleted

## 2017-04-19 ENCOUNTER — Ambulatory Visit
Admission: RE | Admit: 2017-04-19 | Discharge: 2017-04-19 | Disposition: A | Discharge status: Home / Self Care | Payer: BLUE CROSS/BLUE SHIELD | Source: Ambulatory Visit | Attending: Radiation Oncology | Admitting: Radiation Oncology

## 2017-04-19 DIAGNOSIS — C61 Malignant neoplasm of prostate: Secondary | ICD-10-CM

## 2017-04-19 DIAGNOSIS — Z510 Encounter for antineoplastic radiation therapy: Secondary | ICD-10-CM | POA: Diagnosis not present

## 2017-04-19 MED ORDER — SILVER SULFADIAZINE 1 % EX CREA: 1.0000 "application " | TOPICAL_CREAM | Freq: Two times a day (BID) | CUTANEOUS | 2 refills | Status: DC

## 2017-04-20 ENCOUNTER — Ambulatory Visit
Admission: RE | Admit: 2017-04-20 | Discharge: 2017-04-20 | Disposition: A | Discharge status: Home / Self Care | Payer: BLUE CROSS/BLUE SHIELD | Source: Ambulatory Visit | Attending: Radiation Oncology | Admitting: Radiation Oncology

## 2017-04-20 DIAGNOSIS — Z510 Encounter for antineoplastic radiation therapy: Secondary | ICD-10-CM | POA: Diagnosis not present

## 2017-04-21 ENCOUNTER — Ambulatory Visit
Admission: RE | Admit: 2017-04-21 | Discharge: 2017-04-21 | Disposition: A | Discharge status: Home / Self Care | Payer: BLUE CROSS/BLUE SHIELD | Source: Ambulatory Visit | Attending: Radiation Oncology | Admitting: Radiation Oncology

## 2017-04-21 DIAGNOSIS — Z510 Encounter for antineoplastic radiation therapy: Secondary | ICD-10-CM | POA: Diagnosis not present

## 2017-04-21 LAB — ACID FAST SMEAR (AFB): ACID FAST SMEAR - AFSCU2: NEGATIVE

## 2017-04-21 LAB — ACID FAST SMEAR (AFB, MYCOBACTERIA)

## 2017-04-22 ENCOUNTER — Inpatient Hospital Stay: Payer: BLUE CROSS/BLUE SHIELD | Attending: Hematology and Oncology

## 2017-04-22 ENCOUNTER — Ambulatory Visit
Admission: RE | Admit: 2017-04-22 | Discharge: 2017-04-22 | Disposition: A | Discharge status: Home / Self Care | Payer: BLUE CROSS/BLUE SHIELD | Source: Ambulatory Visit | Attending: Radiation Oncology | Admitting: Radiation Oncology

## 2017-04-22 ENCOUNTER — Inpatient Hospital Stay: Payer: BLUE CROSS/BLUE SHIELD

## 2017-04-22 ENCOUNTER — Inpatient Hospital Stay (HOSPITAL_BASED_OUTPATIENT_CLINIC_OR_DEPARTMENT_OTHER): Payer: BLUE CROSS/BLUE SHIELD | Admitting: Hematology and Oncology

## 2017-04-22 ENCOUNTER — Encounter: Payer: Self-pay | Admitting: Hematology and Oncology

## 2017-04-22 VITALS — BP 127/67 | HR 105 | Temp 98.5°F | Resp 18 | Wt 364.0 lb

## 2017-04-22 DIAGNOSIS — F329 Major depressive disorder, single episode, unspecified: Secondary | ICD-10-CM | POA: Diagnosis not present

## 2017-04-22 DIAGNOSIS — I10 Essential (primary) hypertension: Secondary | ICD-10-CM | POA: Diagnosis not present

## 2017-04-22 DIAGNOSIS — R591 Generalized enlarged lymph nodes: Secondary | ICD-10-CM | POA: Insufficient documentation

## 2017-04-22 DIAGNOSIS — D649 Anemia, unspecified: Secondary | ICD-10-CM | POA: Diagnosis not present

## 2017-04-22 DIAGNOSIS — C01 Malignant neoplasm of base of tongue: Secondary | ICD-10-CM | POA: Insufficient documentation

## 2017-04-22 DIAGNOSIS — Z79899 Other long term (current) drug therapy: Secondary | ICD-10-CM | POA: Diagnosis not present

## 2017-04-22 DIAGNOSIS — Z5111 Encounter for antineoplastic chemotherapy: Secondary | ICD-10-CM

## 2017-04-22 DIAGNOSIS — Z87891 Personal history of nicotine dependence: Secondary | ICD-10-CM | POA: Insufficient documentation

## 2017-04-22 DIAGNOSIS — D4702 Systemic mastocytosis: Secondary | ICD-10-CM | POA: Diagnosis not present

## 2017-04-22 DIAGNOSIS — G4730 Sleep apnea, unspecified: Secondary | ICD-10-CM | POA: Diagnosis not present

## 2017-04-22 DIAGNOSIS — K219 Gastro-esophageal reflux disease without esophagitis: Secondary | ICD-10-CM

## 2017-04-22 DIAGNOSIS — J449 Chronic obstructive pulmonary disease, unspecified: Secondary | ICD-10-CM | POA: Diagnosis not present

## 2017-04-22 DIAGNOSIS — K1230 Oral mucositis (ulcerative), unspecified: Secondary | ICD-10-CM | POA: Diagnosis not present

## 2017-04-22 DIAGNOSIS — Z7982 Long term (current) use of aspirin: Secondary | ICD-10-CM

## 2017-04-22 DIAGNOSIS — Z794 Long term (current) use of insulin: Secondary | ICD-10-CM | POA: Insufficient documentation

## 2017-04-22 DIAGNOSIS — K1233 Oral mucositis (ulcerative) due to radiation: Secondary | ICD-10-CM

## 2017-04-22 DIAGNOSIS — J029 Acute pharyngitis, unspecified: Secondary | ICD-10-CM | POA: Insufficient documentation

## 2017-04-22 DIAGNOSIS — E119 Type 2 diabetes mellitus without complications: Secondary | ICD-10-CM | POA: Diagnosis not present

## 2017-04-22 DIAGNOSIS — G629 Polyneuropathy, unspecified: Secondary | ICD-10-CM | POA: Diagnosis not present

## 2017-04-22 DIAGNOSIS — Z510 Encounter for antineoplastic radiation therapy: Secondary | ICD-10-CM | POA: Diagnosis not present

## 2017-04-22 DIAGNOSIS — E538 Deficiency of other specified B group vitamins: Secondary | ICD-10-CM | POA: Diagnosis not present

## 2017-04-22 LAB — MAGNESIUM: Magnesium: 1.7 mg/dL (ref 1.7–2.4)

## 2017-04-22 LAB — COMPREHENSIVE METABOLIC PANEL
ALT: 61 U/L (ref 17–63)
AST: 30 U/L (ref 15–41)
Albumin: 3.9 g/dL (ref 3.5–5.0)
Alkaline Phosphatase: 61 U/L (ref 38–126)
Anion gap: 6 (ref 5–15)
BUN: 16 mg/dL (ref 6–20)
CO2: 28 mmol/L (ref 22–32)
Calcium: 8.8 mg/dL — ABNORMAL LOW (ref 8.9–10.3)
Chloride: 104 mmol/L (ref 101–111)
Creatinine, Ser: 0.8 mg/dL (ref 0.61–1.24)
GFR calc Af Amer: >60 mL/min (ref >60)
GFR calc non Af Amer: >60 mL/min (ref >60)
Glucose, Bld: 133 mg/dL — ABNORMAL HIGH (ref 65–99)
Potassium: 4.1 mmol/L (ref 3.5–5.1)
Sodium: 138 mmol/L (ref 135–145)
Total Bilirubin: 0.8 mg/dL (ref 0.3–1.2)
Total Protein: 6.5 g/dL (ref 6.5–8.1)

## 2017-04-22 LAB — IRON AND TIBC
Iron: 123 ug/dL (ref 45–182)
Saturation Ratios: 38 % (ref 17.9–39.5)
TIBC: 325 ug/dL (ref 250–450)
UIBC: 202 ug/dL

## 2017-04-22 LAB — CBC WITH DIFFERENTIAL/PLATELET
Basophils Absolute: 0 10*3/uL (ref 0–0.1)
Basophils Relative: 1 %
Eosinophils Absolute: 0 10*3/uL (ref 0–0.7)
Eosinophils Relative: 0 %
HCT: 27.9 % — ABNORMAL LOW (ref 40.0–52.0)
Hemoglobin: 9.2 g/dL — ABNORMAL LOW (ref 13.0–18.0)
Lymphocytes Relative: 16 %
Lymphs Abs: 0.9 10*3/uL — ABNORMAL LOW (ref 1.0–3.6)
MCH: 33.7 pg (ref 26.0–34.0)
MCHC: 33 g/dL (ref 32.0–36.0)
MCV: 102.1 fL — ABNORMAL HIGH (ref 80.0–100.0)
Monocytes Absolute: 0.5 10*3/uL (ref 0.2–1.0)
Monocytes Relative: 9 %
Neutro Abs: 4.2 10*3/uL (ref 1.4–6.5)
Neutrophils Relative %: 74 %
Platelets: 174 10*3/uL (ref 150–440)
RBC: 2.73 MIL/uL — ABNORMAL LOW (ref 4.40–5.90)
RDW: 25.6 % — ABNORMAL HIGH (ref 11.5–14.5)
WBC: 5.7 10*3/uL (ref 3.8–10.6)

## 2017-04-22 LAB — RETICULOCYTES
RBC.: 2.82 MIL/uL — ABNORMAL LOW (ref 4.40–5.90)
Retic Count, Absolute: 118.4 10*3/uL (ref 19.0–183.0)
Retic Ct Pct: 4.2 % — ABNORMAL HIGH (ref 0.4–3.1)

## 2017-04-22 LAB — FOLATE: Folate: 17.1 ng/mL (ref >5.9)

## 2017-04-22 LAB — FERRITIN: Ferritin: 143 ng/mL (ref 24–336)

## 2017-04-22 LAB — VITAMIN B12: Vitamin B-12: 186 pg/mL (ref 180–914)

## 2017-04-22 MED ORDER — HEPARIN SOD (PORK) LOCK FLUSH 100 UNIT/ML IV SOLN
500.0000 [IU] | Freq: Once | INTRAVENOUS | Status: AC
  Administered 2017-04-22: 500 [IU] via INTRAVENOUS
  Filled 2017-04-22: qty 5

## 2017-04-22 MED ORDER — SODIUM CHLORIDE 0.9 % IV SOLN
Freq: Once | INTRAVENOUS | Status: AC
  Administered 2017-04-22: 13:00:00 via INTRAVENOUS
  Filled 2017-04-22: qty 5

## 2017-04-22 MED ORDER — SODIUM CHLORIDE 0.9 % IV SOLN
Freq: Once | INTRAVENOUS | Status: AC
  Administered 2017-04-22: 10:00:00 via INTRAVENOUS
  Filled 2017-04-22: qty 1000

## 2017-04-22 MED ORDER — PALONOSETRON HCL INJECTION 0.25 MG/5ML
0.2500 mg | Freq: Once | INTRAVENOUS | Status: AC
  Administered 2017-04-22: 0.25 mg via INTRAVENOUS
  Filled 2017-04-22: qty 5

## 2017-04-22 MED ORDER — SODIUM CHLORIDE 0.9 % IV SOLN
40.0000 mg/m2 | Freq: Once | INTRAVENOUS | Status: AC
  Administered 2017-04-22: 120 mg via INTRAVENOUS
  Filled 2017-04-22: qty 100

## 2017-04-22 MED ORDER — SODIUM CHLORIDE 0.9% FLUSH
10.0000 mL | Freq: Once | INTRAVENOUS | Status: AC
  Administered 2017-04-22: 10 mL via INTRAVENOUS
  Filled 2017-04-22: qty 10

## 2017-04-22 MED ORDER — LORAZEPAM 0.5 MG PO TABS: 0.5000 mg | ORAL_TABLET | Freq: Four times a day (QID) | ORAL | 0 refills | Status: AC | PRN

## 2017-04-22 MED ORDER — POTASSIUM CHLORIDE 2 MEQ/ML IV SOLN
Freq: Once | INTRAVENOUS | Status: AC
  Administered 2017-04-22: 10:00:00 via INTRAVENOUS
  Filled 2017-04-22: qty 1000

## 2017-04-22 NOTE — Progress Notes (Signed)
[facility] day:  04/22/2017   Chief Complaint: Dwayne Richard is a 46 y.o. male with systemic mastocytosis who is seen for assessment prior to week #6 cisplatin with concurrent radiation.  HPI:  The patient was last seen in the medical oncology clinic by me on 03/25/2017.  At that time, he had early mucositis.  Left sided cervical adenopathy had decreased.  He received week #3 cisplatin.  He saw Dr Janese Banks on 04/01/2017 in my absence. He received week #4 sisplatin.  Radiation was then held x 2 days.  He saw Dr Rogue Bussing on 04/08/2017 in my absence on 04/08/2017.  He received week #5 cisplatin.    Radiation was held last week secondary to mucositis.  Radiation re-started on 04/19/2017.  Radiation should complete on 05/04/2017.  Symptomatically, he feels much better today.  He is eating "little things".  He describes "looking for flavor".  Diet is normal.  He has some tinnitus.  He denies any bleeding.   Past Medical History:  Diagnosis Date  . COPD (chronic obstructive pulmonary disease) ([place])   . Depression   . Diabetes mellitus without complication ([place])   . GERD (gastroesophageal reflux disease)   . Hypertension   . Neuropathy    from knees down, "tingling" in hands  . S/P insertion of spinal cord stimulator    (x2)  . Sleep apnea   . Systemic mastocytosis    Per pt    Past Surgical History:  Procedure Laterality Date  . cataract surgery  08/06/2014  . cataract surgery  11/05/2014  . MASS BIOPSY Left 02/17/2017   Procedure: NECK MASS BIOPSY;  Surgeon: Margaretha Sheffield, MD;  Location: [facility];  Service: ENT;  Laterality: Left;  NEED HARMONIC SCAPLE diabetic - insulin and oral meds sleep apnea  . PORTA CATH INSERTION N/A 02/22/2017   Procedure: Glori Luis Cath Insertion;  Surgeon: Algernon Huxley, MD;  Location: [facility];  Service: Cardiovascular;  Laterality: N/A;  . SEPTOPLASTY  12/15/2011  . Spinal implants  10/06/2011  .  TYMPANOSTOMY TUBE PLACEMENT Right 11/03/2011  . X-STOP IMPLANTATION Right 07/25/2013    Family History  Problem Relation Age of Onset  . Diabetes Father   . Hypertension Father   . Diabetes Paternal Grandmother     Social History:  reports that he quit smoking about 2 years ago. He has never used smokeless tobacco. He reports that he does not drink alcohol or use drugs.  He and his wife moved from [place].  He lives in [place].  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  The patient is accompanied by his wife, Stanton Kidney.  Allergies: No Known Allergies  Current Medications: Current Outpatient Prescriptions  Medication Sig Dispense Refill  . amitriptyline (ELAVIL) 25 MG tablet Take 25 mg by mouth daily.    Marland Kitchen amLODipine (NORVASC) 5 MG tablet Take 5 mg by mouth daily.    Marland Kitchen aspirin EC 81 MG tablet Take 81 mg by mouth 2 (two) times daily.    Marland Kitchen atorvastatin (LIPITOR) 20 MG tablet Take 20 mg by mouth daily.     . Calcium Citrate 200 MG TABS Take 1,000 mg by mouth 2 (two) times daily.     . Cetirizine HCl 10 MG CAPS Take 10 mg by mouth 2 (two) times daily.     . cimetidine (TAGAMET) 400 MG tablet Take 400 mg by mouth 2 (two) times daily.     . cromolyn (GASTROCROM) 100  MG/5ML solution Take 200 mg by mouth 4 (four) times daily -  before meals and at bedtime.     Marland Kitchen dexamethasone (DECADRON) 4 MG tablet Take 2 tablets by mouth once a day on the day after chemotherapy and then take 2 tablets two times a day for 2 days. Take with food. 30 tablet 1  . diphenhydrAMINE (BENADRYL) 50 MG capsule Take 50 mg by mouth 2 (two) times daily.     Marland Kitchen doxepin (SINEQUAN) 10 MG/ML solution Take 2.5 ml [31m] in 5 ml of water; Oral rinse for 1 minute; and spit out once a day. 120 mL 1  . EPINEPHrine 0.3 mg/0.3 mL IJ SOAJ injection Inject 0.3 mg into the muscle once.     . fluconazole (DIFLUCAN) 100 MG tablet Take 1 tablet (100 mg total) by mouth daily. 5 tablet 0  . fluticasone (FLONASE) 50 MCG/ACT nasal spray Place 2 sprays into both  nostrils daily.     .Marland Kitchengabapentin (NEURONTIN) 600 MG tablet Take 1,200 mg by mouth 3 (three) times daily.     .Marland KitchenGLEEVEC 400 MG tablet Take 1 tablet (400 mg total) by mouth daily. Take with meals and large glass of water.Caution:Chemotherapy. 90 tablet 0  . glimepiride (AMARYL) 4 MG tablet Take 4 mg by mouth 2 (two) times daily.     .Marland Kitchenglucagon (GLUCAGON EMERGENCY) 1 MG injection Inject 1 mg into the muscle once as needed.     . Insulin Glargine (LANTUS [place]) Inject 90 Units into the skin daily.     . insulin regular (NOVOLIN R,HUMULIN R) 100 units/mL injection Inject 80 Units into the skin 3 (three) times daily as needed.     . Insulin Syringe-Needle U-100 (B-D INS SYR ULTRAFINE .3CC/30G) 30G X 1/2" 0.3 ML MISC Use 1 Syringe as directed.    . Lancets 28G MISC Use 1 each 3 (three) times daily. Use as instructed.    . lidocaine-prilocaine (EMLA) cream Apply 1 application topically as needed. 30 g 0  . LORazepam (ATIVAN) 0.5 MG tablet Take 1 tablet (0.5 mg total) by mouth every 6 (six) hours as needed (Nausea or vomiting). 30 tablet 0  . losartan (COZAAR) 100 MG tablet Take 100 mg by mouth daily.     . magic mouthwash w/lidocaine SOLN Take 5 mLs by mouth 4 (four) times daily. Swish and spit 240 mL 0  . metFORMIN (GLUCOPHAGE) 1000 MG tablet Take 2,000 mg by mouth daily with breakfast.     . Multiple Vitamin (MULTIVITAMIN) tablet Take 1 tablet by mouth daily.    .Marland Kitchenomeprazole (PRILOSEC) 20 MG capsule Take 20 mg by mouth 2 (two) times daily before a meal.     . ondansetron (ZOFRAN) 8 MG tablet Take 1 tablet (8 mg total) by mouth 2 (two) times daily as needed. Start on the third day after chemotherapy. 30 tablet 1  . oxyCODONE-acetaminophen (PERCOCET/ROXICET) 5-325 MG tablet Take 1 tablet by mouth every 4 (four) hours as needed for severe pain. Not to exceed 3 grams of tylenol per day. 90 tablet 0  . phenazopyridine (PYRIDIUM) 200 MG tablet Take 200 mg by mouth 2 (two) times daily.     . silver sulfADIAZINE  (SILVADENE) 1 % cream Apply 1 application topically 2 (two) times daily. 50 g 2  . sucralfate (CARAFATE) 1 g tablet Take 1 tablet (1 g total) by mouth 3 (three) times daily. Dissolve in 2-3 tbsp warm water, swish and swallow. 90 tablet 3  . tamsulosin (FLOMAX)  0.4 MG CAPS capsule Take 0.4 mg by mouth daily.     Marland Kitchen tiZANidine (ZANAFLEX) 4 MG capsule Take 4 mg by mouth daily.     . traMADol (ULTRAM) 50 MG tablet Take 50 mg by mouth 3 (three) times daily.     Marland Kitchen zolpidem (AMBIEN CR) 12.5 MG CR tablet Take 12.5 mg by mouth at bedtime.     Marland Kitchen dexamethasone (DECADRON) 4 MG tablet Take 1 tablet (4 mg total) by mouth 2 (two) times daily with a meal. (Patient not taking: Reported on 04/22/2017) 20 tablet 0   No current facility-administered medications for this visit.    Facility-Administered Medications Ordered in Other Visits  Medication Dose Route Frequency Provider Last Rate Last Dose  . heparin lock flush 100 unit/mL  500 Units Intravenous Once Lequita Asal, MD        Review of Systems:  GENERAL:  Feels"better".  No fevers or sweats.  Weight stable. PERFORMANCE STATUS (ECOG):  1 HEENT:  Mucositis and associated thick secretions, improved.  Tinnitus.  No visual changes, runny nose, mouth sores or tenderness. Lungs: Shortness of breath on exertion.  No cough.  No hemoptysis.  Sleep apnea. Cardiac:  No chest pain, palpitations, orthopnea, or PND. GI:  Eating well.  No nausea, vomiting, diarrhea, melena or hematochezia.  Hemorrhoids. GU:  No urgency, frequency, dysuria, or hematuria. On Flomax. Musculoskeletal:  No back pain.  No joint pain.  No muscle tenderness. Extremities:  No pain or swelling. Skin:  No severe flares or urticaria. Neuro:  No headache, numbness or weakness, balance or coordination issues. Endocrine:  Diabetes.  No tthyroid issues, hot flashes or night sweats. Psych:  No mood changes, depression or anxiety.  Poor sleep. Pain:  Neck pain (5 out of 10). Review of systems:   All other systems reviewed and found   Physical Exam: Blood pressure 127/67, pulse (!) 105, temperature 98.5 F (36.9 C), temperature source Tympanic, resp. rate 18, weight (!) 364 lb (165.1 kg). GENERAL:  Well developed, well nourished, overweight gentleman sitting comfortably in the exam room in no acute distress. MENTAL STATUS:  Alert and oriented to person, place and time. HEAD:  Short dark crewcut.  Normocephalic, atraumatic, face symmetric, no Cushingoid features. EYES:  Glasses.  Pupils equal round and reactive to light and accomodation.  No conjunctivitis or scleral icterus. ENT:  Oropharynx with minimal mucositis posterior pharynx/uvula.  Tongue normal.  Mucous membranes moist.  RESPIRATORY:  Clear to auscultation without rales, wheezes or rhonchi. CARDIOVASCULAR:  Regular rate and rhythm without murmur, rub or gallop. ABDOMEN:  Soft, non-tender, with active bowel sounds, and no appreciable hepatosplenomegaly.  No masses. SKIN:  No rashes or skin changes.  EXTREMITIES: No edema, no skin discoloration or tenderness.  No palpable cords. LYMPH NODES:  Left sided cervical adenopathy, resolved.  No palpable supraclavicular, axillary or inguinal adenopathy  NEUROLOGICAL: Unremarkable. PSYCH:  Appropriate.   Infusion on 04/22/2017  Component Date Value Ref Range Status  . WBC 04/22/2017 5.7  3.8 - 10.6 K/uL Final  . RBC 04/22/2017 2.73* 4.40 - 5.90 MIL/uL Final  . Hemoglobin 04/22/2017 9.2* 13.0 - 18.0 g/dL Final  . HCT 04/22/2017 27.9* 40.0 - 52.0 % Final  . MCV 04/22/2017 102.1* 80.0 - 100.0 fL Final  . MCH 04/22/2017 33.7  26.0 - 34.0 pg Final  . MCHC 04/22/2017 33.0  32.0 - 36.0 g/dL Final  . RDW 04/22/2017 25.6* 11.5 - 14.5 % Final  . Platelets 04/22/2017 174  150 -  440 K/uL Final  . Neutrophils Relative % 04/22/2017 74  % Final  . Neutro Abs 04/22/2017 4.2  1.4 - 6.5 K/uL Final  . Lymphocytes Relative 04/22/2017 16  % Final  . Lymphs Abs 04/22/2017 0.9* 1.0 - 3.6 K/uL Final   . Monocytes Relative 04/22/2017 9  % Final  . Monocytes Absolute 04/22/2017 0.5  0.2 - 1.0 K/uL Final  . Eosinophils Relative 04/22/2017 0  % Final  . Eosinophils Absolute 04/22/2017 0.0  0 - 0.7 K/uL Final  . Basophils Relative 04/22/2017 1  % Final  . Basophils Absolute 04/22/2017 0.0  0 - 0.1 K/uL Final  . Sodium 04/22/2017 138  135 - 145 mmol/L Final  . Potassium 04/22/2017 4.1  3.5 - 5.1 mmol/L Final  . Chloride 04/22/2017 104  101 - 111 mmol/L Final  . CO2 04/22/2017 28  22 - 32 mmol/L Final  . Glucose, Bld 04/22/2017 133* 65 - 99 mg/dL Final  . BUN 04/22/2017 16  6 - 20 mg/dL Final  . Creatinine, Ser 04/22/2017 0.80  0.61 - 1.24 mg/dL Final  . Calcium 04/22/2017 8.8* 8.9 - 10.3 mg/dL Final  . Total Protein 04/22/2017 6.5  6.5 - 8.1 g/dL Final  . Albumin 04/22/2017 3.9  3.5 - 5.0 g/dL Final  . AST 04/22/2017 30  15 - 41 U/L Final  . ALT 04/22/2017 61  17 - 63 U/L Final  . Alkaline Phosphatase 04/22/2017 61  38 - 126 U/L Final  . Total Bilirubin 04/22/2017 0.8  0.3 - 1.2 mg/dL Final  . GFR calc non Af Amer 04/22/2017 >60  >60 mL/min Final  . GFR calc Af Amer 04/22/2017 >60  >60 mL/min Final   Comment: (NOTE) The eGFR has been calculated using the CKD EPI equation. This calculation has not been validated in all clinical situations. eGFR's persistently <60 mL/min signify possible Chronic Kidney Disease.   . Anion gap 04/22/2017 6  5 - 15 Final  . Magnesium 04/22/2017 1.7  1.7 - 2.4 mg/dL Final    Assessment:  Perrion Diesel is a 46 y.o. 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  Excisional left neck node biopsy on 02/17/2017 revealed metastatic squamous cell carcinoma, P16 positive.  HPV in situ hybridization was positive (16/18).  EBV ISH was negative.  Galactose alpha 1,3 was negative.  Neck CT on 03/25/2018revealed bulky (left >right) cervical lymphadenopathy. The largest nodes wereat the left level II station  measuring up to 4.4 cm long axis (2.5-3 cm short axis). There were smaller but abnormally rounded lymph nodes throughout the neck elsewhere. There were nocystic or necrotic nodes. There was superimposed nonspecific appearing inflammatory process at the right level 1B nodal station, with associated overlying cellulitis. There was no associated fluid collection.  Chest CT on 03/26/2018revealed scattered upper normal to mildly enlarged lymph nodes in the mediastinum and upper abdomen come nonspecific for reactive versus neoplastic etiology. An index right hilar lymph node measures 1.1 cm in short axis.  PET scan on 04/09/2018revealed focal hypermetabolism in the left posterior oropharynx centered at the left base of tongue with associated asymmetric soft tissue fullness on the CT images, compatible with primary oropharyngeal carcinoma. There was hypermetabolic left level II cervical nodal metastases. There was no hypermetabolic contralateral cervical nodal metastases or distant metastatic disease. Clinical stageis T2N1M0.  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 Tryptase was 32.3 (2.2-13.2) on 07/29/2016. He is on  several medications: cetirizine (Zyrtec), cimetidine (Tagamet), cromolyn (Gastrocrom), and Gleevec (400 mg a day). He uses an epinephrine pen 3-4 times a year.  He was on a trial ofinterferonin early 2016 x 69month. Interferon was discontinued in 07/2015. Interferon did not improve his symptoms. He experienceduncontrolled diabetes and elevated liver function tests with interferon.  [facility] [place]. There were small poorly defined areas of apparent spindle cells which did not stained positive for mastcells. CK mutation was negative. There were rare atypical cells expressing CD-117 and CD-25. The cells represented  approximately 0.1% of the leukocytes.   [facility] [place] of persistent systemic mastocytosis (5-10% of cellularity). There was no increase in blast or overt dyspoiesis. There was moderate reticulin fibrosis. Storage iron was present. There were no ring sideroblasts. Flow cytometry revealed no phenotypic evidence of lymphoma or increased blasts. Cytogenetics were normal (46, XY). There was no evidence of KIT(D816V) point mutation by PCR.   He has osteopeniaon bone density study in 2014. He is on calcium and vitamin D.  He began radiation on 03/08/2017.  He began weekly cisplatin (40 mg/m2) on 03/11/2017.  He is currently on week #6.  Radiation was held for 2 days after cycle #4 and for a week after cycle #5 secondary to mucositis.  He is using a doxepin rinse.  He restarted radiation on 04/19/2017.  Radiation should complete on 05/04/2017.  Symptomatically, mucositis has dramatically improved.  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  Left sided cervical adenopathy has resolved.  He has progressive anemia.  Plan: 1.  Labs today:  CBC with diff, CMP, Mg. 2.  Anemia work-up: ferritin, iron studies, folate, B12, retic. 3.  Week #6 cisplatin today. 4.  Discuss tinnitus and ongoing hearing loss.  Patient wishes to continue cisplatin.  Anticipate 1 more weekly dose of cisplatin  5.  Rx:  Ativan 0.5 mg po q 6 hours prn. 6.  RTC in 1 week for MD assessment, labs (CBC with diff, CMP, Mg) and week #7 cisplatin  Addendum:  B12 is low (186).  Preauth for B12 then begin B12 weekly x 6 then monthly.   MLequita Asal MD  04/22/2017, 9:18 AM

## 2017-04-22 NOTE — Progress Notes (Signed)
Patient states he is not sleeping very well.  Sleeps only every other night.  Patient also states he is experiencing some ringing in his ears.  Appetite good.

## 2017-04-25 ENCOUNTER — Other Ambulatory Visit: Payer: Self-pay | Admitting: *Deleted

## 2017-04-25 ENCOUNTER — Ambulatory Visit
Admission: RE | Admit: 2017-04-25 | Discharge: 2017-04-25 | Disposition: A | Discharge status: Home / Self Care | Payer: BLUE CROSS/BLUE SHIELD | Source: Ambulatory Visit | Attending: Radiation Oncology | Admitting: Radiation Oncology

## 2017-04-25 DIAGNOSIS — Z510 Encounter for antineoplastic radiation therapy: Secondary | ICD-10-CM | POA: Diagnosis not present

## 2017-04-25 MED ORDER — AZITHROMYCIN 250 MG PO TABS: ORAL_TABLET | ORAL | 0 refills | Status: DC

## 2017-04-26 ENCOUNTER — Ambulatory Visit: Payer: BLUE CROSS/BLUE SHIELD

## 2017-04-26 ENCOUNTER — Ambulatory Visit
Admission: RE | Admit: 2017-04-26 | Discharge: 2017-04-26 | Disposition: A | Discharge status: Home / Self Care | Payer: BLUE CROSS/BLUE SHIELD | Source: Ambulatory Visit | Attending: Radiation Oncology | Admitting: Radiation Oncology

## 2017-04-26 DIAGNOSIS — Z510 Encounter for antineoplastic radiation therapy: Secondary | ICD-10-CM | POA: Diagnosis not present

## 2017-04-27 ENCOUNTER — Ambulatory Visit
Admission: RE | Admit: 2017-04-27 | Discharge: 2017-04-27 | Disposition: A | Discharge status: Home / Self Care | Payer: BLUE CROSS/BLUE SHIELD | Source: Ambulatory Visit | Attending: Radiation Oncology | Admitting: Radiation Oncology

## 2017-04-27 DIAGNOSIS — Z510 Encounter for antineoplastic radiation therapy: Secondary | ICD-10-CM | POA: Diagnosis not present

## 2017-04-28 ENCOUNTER — Ambulatory Visit
Admission: RE | Admit: 2017-04-28 | Discharge: 2017-04-28 | Disposition: A | Discharge status: Home / Self Care | Payer: BLUE CROSS/BLUE SHIELD | Source: Ambulatory Visit | Attending: Radiation Oncology | Admitting: Radiation Oncology

## 2017-04-28 DIAGNOSIS — Z510 Encounter for antineoplastic radiation therapy: Secondary | ICD-10-CM | POA: Diagnosis not present

## 2017-04-28 NOTE — Progress Notes (Signed)
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 Excisional left neck node biopsy on 02/17/2017 revealed metastatic squamous cell carcinoma, P16 positive. HPV in situ hybridizationwas positive (16/18). EBV ISH was negative. Galactose alpha 1,3 was negative.  Neck CT on 03/25/2018revealed bulky (left >right) cervical lymphadenopathy. The largest nodes wereat the left level II station measuring up to 4.4 cm long axis (2.5-3 cm short axis). There were smaller but abnormally rounded lymph nodes throughout the neck elsewhere. There were nocystic or necrotic nodes. There was superimposed nonspecific appearing inflammatory process at the right level 1B nodal station, with associated overlying cellulitis. There was no associated fluid collection.  Chest CT on 03/26/2018revealed scattered upper normal to mildly enlarged lymph nodes in the mediastinum and upper abdomen come nonspecific for reactive versus neoplastic etiology. An index right hilar lymph node measures 1.1 cm in short axis.  PET scan on 04/09/2018revealed focal hypermetabolism in the left posterior oropharynx centered at the left base of tongue with associated asymmetric soft tissue fullness on the CT images, compatible with primary oropharyngeal carcinoma. There was hypermetabolic left level II cervical nodal metastases. There was no hypermetabolic contralateral cervical nodal  metastases or distant metastatic disease. Clinical stageis T2N1M0.  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 Tryptase was 32.3 (2.2-13.2) on 07/29/2016. He is on several medications: cetirizine (Zyrtec), cimetidine (Tagamet), cromolyn (Gastrocrom), and Gleevec(400 mg a day). He uses an epinephrine pen 3-4 times a year.  He was on a trial ofinterferonin early 2016 x 66month. Interferon was discontinued in 07/2015. Interferon did not improve his symptoms. He experienceduncontrolled diabetes and elevated liver function tests with interferon.  [facility]. There were small poorly defined areas of apparent spindle cells which did not stained positive for mastcells. CK mutation was negative. There were rare atypical cells expressing CD-117 and CD-25. The cells represented approximately 0.1% of the leukocytes. [facility] [place] [facility] of persistent systemic mastocytosis (5-10% of cellularity). There was no increase in blast or overt dyspoiesis. There was moderate reticulin fibrosis. Storage iron was present. There were no ring sideroblasts. Flow cytometry revealed no phenotypic evidence of lymphoma or increased blasts. Cytogenetics were normal (46, XY). There was no evidence of KIT(D816V) point mutation by PCR.   Neck CT on 03/25/2018revealed bulky (left >right) cervical lymphadenopathy. The largest nodes wereat the left level II station measuring up to 4.4 cm long axis (2.5-3 cm short axis). There were smaller but abnormally rounded lymph nodes throughout the neck elsewhere. There were nocystic or necrotic nodes. There was superimposed nonspecific appearing inflammatory process at the right level 1B nodal station, with associated overlying  cellulitis. There was no associated fluid collection.  Chest CT on 03/26/2018revealed scattered upper normal to mildly enlarged lymph nodes in the mediastinum and upper abdomen come nonspecific for reactive versus neoplastic etiology. An index right hilar lymph  node measures 1.1 cm in short axis.  He has osteopeniaon bone density study in 2014. He is on calcium and vitamin D.  He began radiationon 03/08/2017. He began weekly cisplatin(40 mg/m2) on 03/11/2017.  Interval history- reports increasing fatigue in the last week or so. Mucositis pain well controlled with hydrocodone  ECOG PS- 1 Pain scale- 3 Opioid associated constipation- no  Review of systems- Review of Systems  Constitutional: Positive for malaise/fatigue. Negative for chills, fever and weight loss.  HENT: Negative for congestion, ear discharge and nosebleeds.   Eyes: Negative for blurred vision.  Respiratory: Negative for cough, hemoptysis, sputum production, shortness of breath and wheezing.   Cardiovascular: Negative for chest pain, palpitations, orthopnea and claudication.  Gastrointestinal: Negative for abdominal pain, blood in stool, constipation, diarrhea, heartburn, melena, nausea and vomiting.  Genitourinary: Negative for dysuria, flank pain, frequency, hematuria and urgency.  Musculoskeletal: Negative for back pain, joint pain and myalgias.  Skin: Negative for rash.  Neurological: Negative for dizziness, tingling, focal weakness, seizures, weakness and headaches.  Endo/Heme/Allergies: Does not bruise/bleed easily.  Psychiatric/Behavioral: Negative for depression and suicidal ideas. The patient does not have insomnia.        No Known Allergies   Past Medical History:  Diagnosis Date  . COPD (chronic obstructive pulmonary disease) ([place])   . Depression   . Diabetes mellitus without complication ([place])   . GERD (gastroesophageal reflux disease)   . Hypertension   . Neuropathy    from knees down,  "tingling" in hands  . S/P insertion of spinal cord stimulator    (x2)  . Sleep apnea   . Systemic mastocytosis    Per pt     Past Surgical History:  Procedure Laterality Date  . cataract surgery  08/06/2014  . cataract surgery  11/05/2014  . MASS BIOPSY Left 02/17/2017   Procedure: NECK MASS BIOPSY;  Surgeon: Margaretha Sheffield, MD;  Location: [place];  Service: ENT;  Laterality: Left;  NEED HARMONIC SCAPLE diabetic - insulin and oral meds sleep apnea  . PORTA CATH INSERTION N/A 02/22/2017   Procedure: Glori Luis Cath Insertion;  Surgeon: Algernon Huxley, MD;  Location: [place] [facility];  Service: Cardiovascular;  Laterality: N/A;  . SEPTOPLASTY  12/15/2011  . Spinal implants  10/06/2011  . TYMPANOSTOMY TUBE PLACEMENT Right 11/03/2011  . X-STOP IMPLANTATION Right 07/25/2013    Social History   Social History  . Marital status: Married    Spouse name: N/A  . Number of children: N/A  . Years of education: N/A   Occupational History  . Not on file.   Social History Main Topics  . Smoking status: Former Smoker    Quit date: 11/22/2014  . Smokeless tobacco: Never Used     Comment: Patient smoked 24 years - 2 pks a day  . Alcohol use No  . Drug use: No  . Sexual activity: Not on file   Other Topics Concern  . Not on file   Social History Narrative  . No narrative on file    Family History  Problem Relation Age of Onset  . Diabetes Father   . Hypertension Father   . Diabetes Paternal Grandmother      Current Outpatient Prescriptions:  .  amitriptyline (ELAVIL) 25 MG tablet, Take 25 mg by mouth daily., Disp: , Rfl:  .  amLODipine (NORVASC) 5 MG tablet, Take 5 mg by mouth daily., Disp: , Rfl:  .  aspirin EC 81 MG tablet, Take 81 mg  by mouth 2 (two) times daily., Disp: , Rfl:  .  atorvastatin (LIPITOR) 20 MG tablet, Take 20 mg by mouth daily. , Disp: , Rfl:  .  azithromycin (ZITHROMAX Z-PAK) 250 MG tablet, 1 Z-Pack; follow package directions, Disp: 6 each, Rfl:  0 .  Calcium Citrate 200 MG TABS, Take 1,000 mg by mouth 2 (two) times daily. , Disp: , Rfl:  .  Cetirizine HCl 10 MG CAPS, Take 10 mg by mouth 2 (two) times daily. , Disp: , Rfl:  .  cimetidine (TAGAMET) 400 MG tablet, Take 400 mg by mouth 2 (two) times daily. , Disp: , Rfl:  .  cromolyn (GASTROCROM) 100 MG/5ML solution, Take 200 mg by mouth 4 (four) times daily -  before meals and at bedtime. , Disp: , Rfl:  .  dexamethasone (DECADRON) 4 MG tablet, Take 2 tablets by mouth once a day on the day after chemotherapy and then take 2 tablets two times a day for 2 days. Take with food., Disp: 30 tablet, Rfl: 1 .  dexamethasone (DECADRON) 4 MG tablet, Take 1 tablet (4 mg total) by mouth 2 (two) times daily with a meal., Disp: 20 tablet, Rfl: 0 .  diphenhydrAMINE (BENADRYL) 50 MG capsule, Take 50 mg by mouth 2 (two) times daily. , Disp: , Rfl:  .  doxepin (SINEQUAN) 10 MG/ML solution, Take 2.5 ml [78m] in 5 ml of water; Oral rinse for 1 minute; and spit out once a day., Disp: 120 mL, Rfl: 1 .  EPINEPHrine 0.3 mg/0.3 mL IJ SOAJ injection, Inject 0.3 mg into the muscle once. , Disp: , Rfl:  .  fluconazole (DIFLUCAN) 100 MG tablet, Take 1 tablet (100 mg total) by mouth daily., Disp: 5 tablet, Rfl: 0 .  fluticasone (FLONASE) 50 MCG/ACT nasal spray, Place 2 sprays into both nostrils daily. , Disp: , Rfl:  .  gabapentin (NEURONTIN) 600 MG tablet, Take 1,200 mg by mouth 3 (three) times daily. , Disp: , Rfl:  .  GLEEVEC 400 MG tablet, Take 1 tablet (400 mg total) by mouth daily. Take with meals and large glass of water.Caution:Chemotherapy., Disp: 90 tablet, Rfl: 0 .  glimepiride (AMARYL) 4 MG tablet, Take 4 mg by mouth 2 (two) times daily. , Disp: , Rfl:  .  glucagon (GLUCAGON EMERGENCY) 1 MG injection, Inject 1 mg into the muscle once as needed. , Disp: , Rfl:  .  Insulin Glargine (LANTUS [place]), Inject 90 Units into the skin daily. , Disp: , Rfl:  .  insulin regular (NOVOLIN R,HUMULIN R) 100 units/mL injection,  Inject 80 Units into the skin 3 (three) times daily as needed. , Disp: , Rfl:  .  Insulin Syringe-Needle U-100 (B-D INS SYR ULTRAFINE .3CC/30G) 30G X 1/2" 0.3 ML MISC, Use 1 Syringe as directed., Disp: , Rfl:  .  Lancets 28G MISC, Use 1 each 3 (three) times daily. Use as instructed., Disp: , Rfl:  .  lidocaine-prilocaine (EMLA) cream, Apply 1 application topically as needed., Disp: 30 g, Rfl: 0 .  LORazepam (ATIVAN) 0.5 MG tablet, Take 1 tablet (0.5 mg total) by mouth every 6 (six) hours as needed (Nausea or vomiting)., Disp: 30 tablet, Rfl: 0 .  losartan (COZAAR) 100 MG tablet, Take 100 mg by mouth daily. , Disp: , Rfl:  .  magic mouthwash w/lidocaine SOLN, Take 5 mLs by mouth 4 (four) times daily. Swish and spit, Disp: 240 mL, Rfl: 0 .  metFORMIN (GLUCOPHAGE) 1000 MG tablet, Take 2,000 mg by mouth  daily with breakfast. , Disp: , Rfl:  .  Multiple Vitamin (MULTIVITAMIN) tablet, Take 1 tablet by mouth daily., Disp: , Rfl:  .  omeprazole (PRILOSEC) 20 MG capsule, Take 20 mg by mouth 2 (two) times daily before a meal. , Disp: , Rfl:  .  ondansetron (ZOFRAN) 8 MG tablet, Take 1 tablet (8 mg total) by mouth 2 (two) times daily as needed. Start on the third day after chemotherapy., Disp: 30 tablet, Rfl: 1 .  oxyCODONE-acetaminophen (PERCOCET/ROXICET) 5-325 MG tablet, Take 1 tablet by mouth every 4 (four) hours as needed for severe pain. Not to exceed 3 grams of tylenol per day., Disp: 90 tablet, Rfl: 0 .  phenazopyridine (PYRIDIUM) 200 MG tablet, Take 200 mg by mouth 2 (two) times daily. , Disp: , Rfl:  .  silver sulfADIAZINE (SILVADENE) 1 % cream, Apply 1 application topically 2 (two) times daily., Disp: 50 g, Rfl: 2 .  sucralfate (CARAFATE) 1 g tablet, Take 1 tablet (1 g total) by mouth 3 (three) times daily. Dissolve in 2-3 tbsp warm water, swish and swallow., Disp: 90 tablet, Rfl: 3 .  tamsulosin (FLOMAX) 0.4 MG CAPS capsule, Take 0.4 mg by mouth daily. , Disp: , Rfl:  .  tiZANidine (ZANAFLEX) 4 MG  capsule, Take 4 mg by mouth daily. , Disp: , Rfl:  .  traMADol (ULTRAM) 50 MG tablet, Take 50 mg by mouth 3 (three) times daily. , Disp: , Rfl:  .  zolpidem (AMBIEN CR) 12.5 MG CR tablet, Take 12.5 mg by mouth at bedtime. , Disp: , Rfl:  .  oxyCODONE-acetaminophen (PERCOCET) 10-325 MG tablet, Take 1 tablet by mouth every 4 (four) hours as needed for pain., Disp: 30 tablet, Rfl: 0 No current facility-administered medications for this visit.   Facility-Administered Medications Ordered in Other Visits:  .  sodium chloride flush (NS) 0.9 % injection 10 mL, 10 mL, Intravenous, PRN, Mike Gip, Melissa C, MD, 10 mL at 04/29/17 0827  Physical exam:  Vitals:   04/29/17 0842 04/29/17 0850  BP: (!) 155/81   Pulse: (!) 121 (!) 117  Resp: 20   Temp: 97.8 F (36.6 C)   TempSrc: Tympanic   Weight: (!) 371 lb 3.2 oz (168.4 kg)    Physical Exam  Constitutional: He is oriented to person, place, and time.  Obese. Appears in no acute distress  HENT:  Head: Normocephalic and atraumatic.  No palpable adenopathy in the neck. Mild mucositis noted at the back of throat. Improved as compared to prior  Eyes: EOM are normal. Pupils are equal, round, and reactive to light.  Neck: Normal range of motion.  Cardiovascular: Regular rhythm and normal heart sounds.   tachycardic  Pulmonary/Chest: Effort normal and breath sounds normal.  Abdominal: Soft. Bowel sounds are normal.  Neurological: He is alert and oriented to person, place, and time.  Skin: Skin is warm and dry.     CMP Latest Ref Rng & Units 04/22/2017  Glucose 65 - 99 mg/dL 133(H)  BUN 6 - 20 mg/dL 16  Creatinine 0.61 - 1.24 mg/dL 0.80  Sodium 135 - 145 mmol/L 138  Potassium 3.5 - 5.1 mmol/L 4.1  Chloride 101 - 111 mmol/L 104  CO2 22 - 32 mmol/L 28  Calcium 8.9 - 10.3 mg/dL 8.8(L)  Total Protein 6.5 - 8.1 g/dL 6.5  Total Bilirubin 0.3 - 1.2 mg/dL 0.8  Alkaline Phos 38 - 126 U/L 61  AST 15 - 41 U/L 30  ALT 17 - 63 U/L 61  CBC Latest Ref Rng  & Units 04/22/2017  WBC 3.8 - 10.6 K/uL 5.7  Hemoglobin 13.0 - 18.0 g/dL 9.2(L)  Hematocrit 40.0 - 52.0 % 27.9(L)  Platelets 150 - 440 K/uL 174      Assessment and plan- Patient is a 46 y.o. 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 He will need repeat PET 8-12 weeks post RT. He will see Dr. Mike Gip 2 weeks from now with cbc, cmp and mg and she will order his PET at that time  Radiation associated mucositis-  Continue percocet and magic mouthwash. Prescription for percocet given today  Systemic mastocytosis- on gleevec   Visit Diagnosis 1. Squamous cell carcinoma of base of tongue (HCC)   2. Encounter for antineoplastic chemotherapy      Dr. Randa Evens, MD, MPH [facility] Pager- [phone] 04/29/2017 4:43 PM

## 2017-04-29 ENCOUNTER — Ambulatory Visit
Admission: RE | Admit: 2017-04-29 | Discharge: 2017-04-29 | Disposition: A | Discharge status: Home / Self Care | Payer: BLUE CROSS/BLUE SHIELD | Source: Ambulatory Visit | Attending: Radiation Oncology | Admitting: Radiation Oncology

## 2017-04-29 ENCOUNTER — Inpatient Hospital Stay: Payer: BLUE CROSS/BLUE SHIELD

## 2017-04-29 ENCOUNTER — Ambulatory Visit: Payer: BLUE CROSS/BLUE SHIELD

## 2017-04-29 ENCOUNTER — Encounter: Payer: Self-pay | Admitting: Oncology

## 2017-04-29 ENCOUNTER — Other Ambulatory Visit: Payer: Self-pay | Admitting: Hematology and Oncology

## 2017-04-29 ENCOUNTER — Inpatient Hospital Stay (HOSPITAL_BASED_OUTPATIENT_CLINIC_OR_DEPARTMENT_OTHER): Payer: BLUE CROSS/BLUE SHIELD | Admitting: Oncology

## 2017-04-29 VITALS — BP 155/81 | HR 117 | Temp 97.8°F | Resp 20 | Wt 371.2 lb

## 2017-04-29 DIAGNOSIS — E119 Type 2 diabetes mellitus without complications: Secondary | ICD-10-CM

## 2017-04-29 DIAGNOSIS — Z794 Long term (current) use of insulin: Secondary | ICD-10-CM

## 2017-04-29 DIAGNOSIS — G629 Polyneuropathy, unspecified: Secondary | ICD-10-CM | POA: Diagnosis not present

## 2017-04-29 DIAGNOSIS — Z79899 Other long term (current) drug therapy: Secondary | ICD-10-CM

## 2017-04-29 DIAGNOSIS — Z7982 Long term (current) use of aspirin: Secondary | ICD-10-CM

## 2017-04-29 DIAGNOSIS — I10 Essential (primary) hypertension: Secondary | ICD-10-CM

## 2017-04-29 DIAGNOSIS — D4702 Systemic mastocytosis: Secondary | ICD-10-CM | POA: Diagnosis not present

## 2017-04-29 DIAGNOSIS — Z87891 Personal history of nicotine dependence: Secondary | ICD-10-CM

## 2017-04-29 DIAGNOSIS — K219 Gastro-esophageal reflux disease without esophagitis: Secondary | ICD-10-CM

## 2017-04-29 DIAGNOSIS — K1230 Oral mucositis (ulcerative), unspecified: Secondary | ICD-10-CM

## 2017-04-29 DIAGNOSIS — Z5111 Encounter for antineoplastic chemotherapy: Secondary | ICD-10-CM

## 2017-04-29 DIAGNOSIS — C01 Malignant neoplasm of base of tongue: Secondary | ICD-10-CM

## 2017-04-29 DIAGNOSIS — K1233 Oral mucositis (ulcerative) due to radiation: Secondary | ICD-10-CM

## 2017-04-29 DIAGNOSIS — R591 Generalized enlarged lymph nodes: Secondary | ICD-10-CM

## 2017-04-29 DIAGNOSIS — G4730 Sleep apnea, unspecified: Secondary | ICD-10-CM

## 2017-04-29 DIAGNOSIS — F329 Major depressive disorder, single episode, unspecified: Secondary | ICD-10-CM | POA: Diagnosis not present

## 2017-04-29 DIAGNOSIS — Z510 Encounter for antineoplastic radiation therapy: Secondary | ICD-10-CM | POA: Diagnosis not present

## 2017-04-29 DIAGNOSIS — E538 Deficiency of other specified B group vitamins: Secondary | ICD-10-CM

## 2017-04-29 DIAGNOSIS — D649 Anemia, unspecified: Secondary | ICD-10-CM

## 2017-04-29 DIAGNOSIS — J449 Chronic obstructive pulmonary disease, unspecified: Secondary | ICD-10-CM

## 2017-04-29 LAB — CBC WITH DIFFERENTIAL/PLATELET
Basophils Absolute: 0 10*3/uL (ref 0–0.1)
Basophils Relative: 1 %
Eosinophils Absolute: 0 10*3/uL (ref 0–0.7)
Eosinophils Relative: 1 %
HCT: 23.9 % — ABNORMAL LOW (ref 40.0–52.0)
Hemoglobin: 8 g/dL — ABNORMAL LOW (ref 13.0–18.0)
Lymphocytes Relative: 17 %
Lymphs Abs: 0.7 10*3/uL — ABNORMAL LOW (ref 1.0–3.6)
MCH: 35.4 pg — ABNORMAL HIGH (ref 26.0–34.0)
MCHC: 33.7 g/dL (ref 32.0–36.0)
MCV: 105.1 fL — ABNORMAL HIGH (ref 80.0–100.0)
Monocytes Absolute: 0.3 10*3/uL (ref 0.2–1.0)
Monocytes Relative: 8 %
Neutro Abs: 2.9 10*3/uL (ref 1.4–6.5)
Neutrophils Relative %: 73 %
Platelets: 131 10*3/uL — ABNORMAL LOW (ref 150–440)
RBC: 2.27 MIL/uL — ABNORMAL LOW (ref 4.40–5.90)
RDW: 25.9 % — ABNORMAL HIGH (ref 11.5–14.5)
WBC: 3.9 10*3/uL (ref 3.8–10.6)

## 2017-04-29 LAB — COMPREHENSIVE METABOLIC PANEL
ALT: 50 U/L (ref 17–63)
AST: 29 U/L (ref 15–41)
Albumin: 3.6 g/dL (ref 3.5–5.0)
Alkaline Phosphatase: 64 U/L (ref 38–126)
Anion gap: 4 — ABNORMAL LOW (ref 5–15)
BUN: 11 mg/dL (ref 6–20)
CO2: 29 mmol/L (ref 22–32)
Calcium: 8.5 mg/dL — ABNORMAL LOW (ref 8.9–10.3)
Chloride: 106 mmol/L (ref 101–111)
Creatinine, Ser: 0.85 mg/dL (ref 0.61–1.24)
GFR calc Af Amer: >60 mL/min (ref >60)
GFR calc non Af Amer: >60 mL/min (ref >60)
Glucose, Bld: 83 mg/dL (ref 65–99)
Potassium: 3.7 mmol/L (ref 3.5–5.1)
Sodium: 139 mmol/L (ref 135–145)
Total Bilirubin: 0.7 mg/dL (ref 0.3–1.2)
Total Protein: 6.1 g/dL — ABNORMAL LOW (ref 6.5–8.1)

## 2017-04-29 LAB — MAGNESIUM: Magnesium: 1.6 mg/dL — ABNORMAL LOW (ref 1.7–2.4)

## 2017-04-29 MED ORDER — SODIUM CHLORIDE 0.9 % IV SOLN
Freq: Once | INTRAVENOUS | Status: AC
  Administered 2017-04-29: 09:00:00 via INTRAVENOUS
  Filled 2017-04-29: qty 1000

## 2017-04-29 MED ORDER — HEPARIN SOD (PORK) LOCK FLUSH 100 UNIT/ML IV SOLN
INTRAVENOUS | Status: AC
  Filled 2017-04-29: qty 5

## 2017-04-29 MED ORDER — FOSAPREPITANT DIMEGLUMINE INJECTION 150 MG
Freq: Once | INTRAVENOUS | Status: AC
  Administered 2017-04-29: 12:00:00 via INTRAVENOUS
  Filled 2017-04-29: qty 5

## 2017-04-29 MED ORDER — SODIUM CHLORIDE 0.9 % IV SOLN
40.0000 mg/m2 | Freq: Once | INTRAVENOUS | Status: AC
  Administered 2017-04-29: 120 mg via INTRAVENOUS
  Filled 2017-04-29: qty 120

## 2017-04-29 MED ORDER — HEPARIN SOD (PORK) LOCK FLUSH 100 UNIT/ML IV SOLN
500.0000 [IU] | Freq: Once | INTRAVENOUS | Status: AC
  Administered 2017-04-29: 500 [IU] via INTRAVENOUS

## 2017-04-29 MED ORDER — POTASSIUM CHLORIDE 2 MEQ/ML IV SOLN
Freq: Once | INTRAVENOUS | Status: AC
  Administered 2017-04-29: 10:00:00 via INTRAVENOUS
  Filled 2017-04-29: qty 1000

## 2017-04-29 MED ORDER — SODIUM CHLORIDE 0.9% FLUSH
10.0000 mL | INTRAVENOUS | Status: DC | PRN
Start: 2017-04-29 — End: 2017-04-29
  Administered 2017-04-29: 10 mL via INTRAVENOUS
  Filled 2017-04-29: qty 10

## 2017-04-29 MED ORDER — PALONOSETRON HCL INJECTION 0.25 MG/5ML
0.2500 mg | Freq: Once | INTRAVENOUS | Status: AC
  Administered 2017-04-29: 0.25 mg via INTRAVENOUS
  Filled 2017-04-29: qty 5

## 2017-04-29 MED ORDER — OXYCODONE-ACETAMINOPHEN 10-325 MG PO TABS: 1.0000 | ORAL_TABLET | ORAL | 0 refills | Status: DC | PRN

## 2017-04-29 MED ORDER — CYANOCOBALAMIN 1000 MCG/ML IJ SOLN
1000.0000 ug | Freq: Once | INTRAMUSCULAR | Status: AC
  Administered 2017-04-29: 1000 ug via INTRAMUSCULAR
  Filled 2017-04-29: qty 1

## 2017-04-29 NOTE — Progress Notes (Signed)
Patient reports taking last tablet of zithromax today for sinus infection, but still feels dizzy. C/O throat pain, no other issues.

## 2017-05-02 ENCOUNTER — Ambulatory Visit
Admission: RE | Admit: 2017-05-02 | Discharge: 2017-05-02 | Disposition: A | Discharge status: Home / Self Care | Payer: BLUE CROSS/BLUE SHIELD | Source: Ambulatory Visit | Attending: Radiation Oncology | Admitting: Radiation Oncology

## 2017-05-02 DIAGNOSIS — Z510 Encounter for antineoplastic radiation therapy: Secondary | ICD-10-CM | POA: Diagnosis not present

## 2017-05-03 ENCOUNTER — Ambulatory Visit
Admission: RE | Admit: 2017-05-03 | Discharge: 2017-05-03 | Disposition: A | Discharge status: Home / Self Care | Payer: BLUE CROSS/BLUE SHIELD | Source: Ambulatory Visit | Attending: Radiation Oncology | Admitting: Radiation Oncology

## 2017-05-03 DIAGNOSIS — Z510 Encounter for antineoplastic radiation therapy: Secondary | ICD-10-CM | POA: Diagnosis not present

## 2017-05-04 ENCOUNTER — Ambulatory Visit
Admission: RE | Admit: 2017-05-04 | Discharge: 2017-05-04 | Disposition: A | Discharge status: Home / Self Care | Payer: BLUE CROSS/BLUE SHIELD | Source: Ambulatory Visit | Attending: Radiation Oncology | Admitting: Radiation Oncology

## 2017-05-04 DIAGNOSIS — Z510 Encounter for antineoplastic radiation therapy: Secondary | ICD-10-CM | POA: Diagnosis not present

## 2017-05-06 ENCOUNTER — Inpatient Hospital Stay: Payer: BLUE CROSS/BLUE SHIELD

## 2017-05-06 DIAGNOSIS — E538 Deficiency of other specified B group vitamins: Secondary | ICD-10-CM

## 2017-05-06 DIAGNOSIS — C01 Malignant neoplasm of base of tongue: Secondary | ICD-10-CM | POA: Diagnosis not present

## 2017-05-06 MED ORDER — CYANOCOBALAMIN 1000 MCG/ML IJ SOLN
1000.0000 ug | Freq: Once | INTRAMUSCULAR | Status: AC
  Administered 2017-05-06: 1000 ug via INTRAMUSCULAR
  Filled 2017-05-06: qty 1

## 2017-05-12 ENCOUNTER — Encounter: Payer: Self-pay | Admitting: Hematology and Oncology

## 2017-05-12 ENCOUNTER — Other Ambulatory Visit: Payer: Self-pay | Admitting: Hematology and Oncology

## 2017-05-12 ENCOUNTER — Inpatient Hospital Stay (HOSPITAL_BASED_OUTPATIENT_CLINIC_OR_DEPARTMENT_OTHER): Payer: BLUE CROSS/BLUE SHIELD | Admitting: Hematology and Oncology

## 2017-05-12 ENCOUNTER — Inpatient Hospital Stay: Payer: BLUE CROSS/BLUE SHIELD

## 2017-05-12 VITALS — BP 157/57 | HR 109 | Temp 99.2°F | Resp 20 | Wt 360.0 lb

## 2017-05-12 DIAGNOSIS — R591 Generalized enlarged lymph nodes: Secondary | ICD-10-CM

## 2017-05-12 DIAGNOSIS — D649 Anemia, unspecified: Secondary | ICD-10-CM

## 2017-05-12 DIAGNOSIS — I10 Essential (primary) hypertension: Secondary | ICD-10-CM

## 2017-05-12 DIAGNOSIS — D4702 Systemic mastocytosis: Secondary | ICD-10-CM

## 2017-05-12 DIAGNOSIS — C01 Malignant neoplasm of base of tongue: Secondary | ICD-10-CM

## 2017-05-12 DIAGNOSIS — J449 Chronic obstructive pulmonary disease, unspecified: Secondary | ICD-10-CM

## 2017-05-12 DIAGNOSIS — E538 Deficiency of other specified B group vitamins: Secondary | ICD-10-CM

## 2017-05-12 DIAGNOSIS — Z7982 Long term (current) use of aspirin: Secondary | ICD-10-CM

## 2017-05-12 DIAGNOSIS — J029 Acute pharyngitis, unspecified: Secondary | ICD-10-CM

## 2017-05-12 DIAGNOSIS — Z5111 Encounter for antineoplastic chemotherapy: Secondary | ICD-10-CM

## 2017-05-12 DIAGNOSIS — Z79899 Other long term (current) drug therapy: Secondary | ICD-10-CM

## 2017-05-12 DIAGNOSIS — E119 Type 2 diabetes mellitus without complications: Secondary | ICD-10-CM

## 2017-05-12 DIAGNOSIS — K1233 Oral mucositis (ulcerative) due to radiation: Secondary | ICD-10-CM

## 2017-05-12 DIAGNOSIS — Z794 Long term (current) use of insulin: Secondary | ICD-10-CM

## 2017-05-12 DIAGNOSIS — G4730 Sleep apnea, unspecified: Secondary | ICD-10-CM

## 2017-05-12 DIAGNOSIS — F329 Major depressive disorder, single episode, unspecified: Secondary | ICD-10-CM | POA: Diagnosis not present

## 2017-05-12 DIAGNOSIS — Z87891 Personal history of nicotine dependence: Secondary | ICD-10-CM

## 2017-05-12 DIAGNOSIS — R638 Other symptoms and signs concerning food and fluid intake: Secondary | ICD-10-CM

## 2017-05-12 DIAGNOSIS — K219 Gastro-esophageal reflux disease without esophagitis: Secondary | ICD-10-CM

## 2017-05-12 DIAGNOSIS — G629 Polyneuropathy, unspecified: Secondary | ICD-10-CM

## 2017-05-12 DIAGNOSIS — K1230 Oral mucositis (ulcerative), unspecified: Secondary | ICD-10-CM

## 2017-05-12 LAB — CBC WITH DIFFERENTIAL/PLATELET
Basophils Absolute: 0 10*3/uL (ref 0–0.1)
Basophils Relative: 0 %
Eosinophils Absolute: 0 10*3/uL (ref 0–0.7)
Eosinophils Relative: 1 %
HEMATOCRIT: 23.7 % — AB (ref 40.0–52.0)
HEMOGLOBIN: 8.1 g/dL — AB (ref 13.0–18.0)
LYMPHS ABS: 0.9 10*3/uL — AB (ref 1.0–3.6)
Lymphocytes Relative: 23 %
MCH: 37.3 pg — ABNORMAL HIGH (ref 26.0–34.0)
MCHC: 33.9 g/dL (ref 32.0–36.0)
MCV: 110 fL — ABNORMAL HIGH (ref 80.0–100.0)
MONO ABS: 0.5 10*3/uL (ref 0.2–1.0)
MONOS PCT: 13 %
NEUTROS ABS: 2.5 10*3/uL (ref 1.4–6.5)
NEUTROS PCT: 63 %
Platelets: 222 10*3/uL (ref 150–440)
RBC: 2.16 MIL/uL — ABNORMAL LOW (ref 4.40–5.90)
RDW: 23.5 % — AB (ref 11.5–14.5)
WBC: 4 10*3/uL (ref 3.8–10.6)

## 2017-05-12 LAB — COMPREHENSIVE METABOLIC PANEL
ALK PHOS: 69 U/L (ref 38–126)
ALT: 34 U/L (ref 17–63)
ANION GAP: 7 (ref 5–15)
AST: 25 U/L (ref 15–41)
Albumin: 4.1 g/dL (ref 3.5–5.0)
BILIRUBIN TOTAL: 0.6 mg/dL (ref 0.3–1.2)
BUN: 10 mg/dL (ref 6–20)
CALCIUM: 8.9 mg/dL (ref 8.9–10.3)
CO2: 28 mmol/L (ref 22–32)
Chloride: 106 mmol/L (ref 101–111)
Creatinine, Ser: 0.88 mg/dL (ref 0.61–1.24)
Glucose, Bld: 105 mg/dL — ABNORMAL HIGH (ref 65–99)
POTASSIUM: 4.2 mmol/L (ref 3.5–5.1)
Sodium: 141 mmol/L (ref 135–145)
TOTAL PROTEIN: 6.9 g/dL (ref 6.5–8.1)

## 2017-05-12 LAB — MAGNESIUM: MAGNESIUM: 1.5 mg/dL — AB (ref 1.7–2.4)

## 2017-05-12 MED ORDER — OXYCODONE-ACETAMINOPHEN 10-325 MG PO TABS: 1.0000 | ORAL_TABLET | ORAL | 0 refills | Status: DC | PRN

## 2017-05-12 MED ORDER — OXYCODONE-ACETAMINOPHEN 10-325 MG PO TABS: 1.0000 | ORAL_TABLET | ORAL | 0 refills | Status: AC | PRN

## 2017-05-12 NOTE — Progress Notes (Signed)
Patient here for tongue cancer follow-up. Pt c/o generalized fatigue. Pt reports new onset of visual disturbance. "Sometimes, I feel like a dimming sensation. I wear eye glasses, but at this time,  I do not want to change the eye glasses at this time. I have also noticed some ringing in my ears."   Reports dysphagia with solids and liquids. "i have a burning sensation in the back of my throat." pt eating one meal/day.  Pt reports decreased appetite. Has not met dietician.

## 2017-05-12 NOTE — Progress Notes (Signed)
[facility] day:  05/12/2017   Chief Complaint: Dwayne Richard is a 46 y.o. male with systemic mastocytosis who is seen for assessment prior to week #6 cisplatin with concurrent radiation.  HPI:  The patient was last seen in the medical oncology clinic by me on 04/22/2017.  At that time, he had mild tinnitus.  He received week #6 cisplatin with radiation.  He was seen by Dr. Janese Banks in my absence on 04/29/2017.  At that time, he had mild radiation mucositis,  He continued magic mouthwash and percocet.  He received week #7 cisplatin.  Radiation completed on 05/04/2017.  During the interim, patient reports continued sore throat. He is currently taking Percocet 78m tabs, and is taking up to 2 at a time. Patient is also using magic mouth wash with lidocaine and salt water gargles without noted improvement. Patient reports that he is able to eat and drink with some degree of difficulty "because of the pain".  He notes one specific spot hurts on the left side.  He is still able to eat, describing eating a plate of hash browns, chilli, and 2 poached eggs this morning for breakfast.   Past Medical History:  Diagnosis Date  . COPD (chronic obstructive pulmonary disease) ([facility]   . Depression   . [place] mellitus without complication [place]   . GERD (gastroesophageal reflux disease)   . Hypertension   . Neuropathy    from knees down, "tingling" in hands  . S/P insertion of spinal cord stimulator    (x2)  . Sleep apnea   . Systemic mastocytosis    Per pt    Past Surgical History:  Procedure Laterality Date  . cataract surgery  08/06/2014  . cataract surgery  11/05/2014  . MASS BIOPSY Left 02/17/2017   Procedure: NECK MASS BIOPSY;  Surgeon: PMargaretha Sheffield MD;  [facility]  Service: ENT;  Laterality: Left;  NEED HARMONIC SCAPLE diabetic - insulin and oral meds sleep apnea  . PORTA CATH INSERTION N/A 02/22/2017   Procedure: PGlori LuisCath  Insertion;  Surgeon: JAlgernon Huxley MD;  [facility];  Service: Cardiovascular;  Laterality: N/A;  . SEPTOPLASTY  12/15/2011  . Spinal implants  10/06/2011  . TYMPANOSTOMY TUBE PLACEMENT Right 11/03/2011  . X-STOP IMPLANTATION Right 07/25/2013    Family History  Problem Relation Age of Onset  . Diabetes Father   . Hypertension Father   . Diabetes Paternal Grandmother     Social History:  reports that he quit smoking about 2 years ago. He has never used smokeless tobacco. He reports that he does not drink alcohol or use drugs.  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  The patient is accompanied by his wife, MStanton Kidney  Allergies: No Known Allergies  Current Medications: Current Outpatient Prescriptions  Medication Sig Dispense Refill  . amitriptyline (ELAVIL) 25 MG tablet Take 25 mg by mouth daily.    .Marland KitchenamLODipine (NORVASC) 5 MG tablet Take 5 mg by mouth daily.    .Marland Kitchenaspirin EC 81 MG tablet Take 81 mg by mouth 2 (two) times daily.    .Marland Kitchenatorvastatin (LIPITOR) 20 MG tablet Take 20 mg by mouth daily.     . Calcium Citrate 200 MG TABS Take 1,000 mg by mouth 2 (two) times daily.     . Cetirizine HCl 10 MG CAPS Take 10 mg by  mouth 2 (two) times daily.     . cimetidine (TAGAMET) 400 MG tablet Take 400 mg by mouth 2 (two) times daily.     . cromolyn (GASTROCROM) 100 MG/5ML solution Take 200 mg by mouth 4 (four) times daily -  before meals and at bedtime.     . diphenhydrAMINE (BENADRYL) 50 MG capsule Take 50 mg by mouth 2 (two) times daily.     Marland Kitchen doxepin (SINEQUAN) 10 MG/ML solution Take 2.5 ml [32m] in 5 ml of water; Oral rinse for 1 minute; and spit out once a day. 120 mL 1  . fluconazole (DIFLUCAN) 100 MG tablet Take 1 tablet (100 mg total) by mouth daily. 5 tablet 0  . fluticasone (FLONASE) 50 MCG/ACT nasal spray Place 2 sprays into both nostrils daily.     .Marland Kitchengabapentin (NEURONTIN) 600 MG tablet Take 1,200 mg by mouth 3 (three) times  daily.     .Marland KitchenGLEEVEC 400 MG tablet Take 1 tablet (400 mg total) by mouth daily. Take with meals and large glass of water.Caution:Chemotherapy. 90 tablet 0  . glimepiride (AMARYL) 4 MG tablet Take 4 mg by mouth 2 (two) times daily.     . Insulin Glargine (LANTUS Fielding) Inject 100 Units into the skin daily.     . insulin regular (NOVOLIN R,HUMULIN R) 100 units/mL injection Inject 90 Units into the skin 3 (three) times daily as needed.     . Insulin Syringe-Needle U-100 (B-D INS SYR ULTRAFINE .3CC/30G) 30G X 1/2" 0.3 ML MISC Use 1 Syringe as directed.    . Lancets 28G MISC Use 1 each 3 (three) times daily. Use as instructed.    .Marland KitchenLORazepam (ATIVAN) 0.5 MG tablet Take 1 tablet (0.5 mg total) by mouth every 6 (six) hours as needed (Nausea or vomiting). 30 tablet 0  . losartan (COZAAR) 100 MG tablet Take 100 mg by mouth daily.     . metFORMIN (GLUCOPHAGE) 1000 MG tablet Take 2,000 mg by mouth daily with breakfast.     . Multiple Vitamin (MULTIVITAMIN) tablet Take 1 tablet by mouth daily.    .Marland Kitchenomeprazole (PRILOSEC) 20 MG capsule Take 20 mg by mouth 2 (two) times daily before a meal.     . ondansetron (ZOFRAN) 8 MG tablet Take 1 tablet (8 mg total) by mouth 2 (two) times daily as needed. Start on the third day after chemotherapy. 30 tablet 1  . oxyCODONE-acetaminophen (PERCOCET) 10-325 MG tablet Take 1 tablet by mouth every 4 (four) hours as needed for pain. 30 tablet 0  . phenazopyridine (PYRIDIUM) 200 MG tablet Take 200 mg by mouth 2 (two) times daily.     . sucralfate (CARAFATE) 1 g tablet Take 1 tablet (1 g total) by mouth 3 (three) times daily. Dissolve in 2-3 tbsp warm water, swish and swallow. 90 tablet 3  . tamsulosin (FLOMAX) 0.4 MG CAPS capsule Take 0.4 mg by mouth daily.     .Marland KitchentiZANidine (ZANAFLEX) 4 MG capsule Take 4 mg by mouth daily.     . traMADol (ULTRAM) 50 MG tablet Take 50 mg by mouth 3 (three) times daily.     .Marland Kitchenzolpidem (AMBIEN CR) 12.5 MG CR tablet Take 12.5 mg by mouth at bedtime.      .Marland Kitchenazithromycin (ZITHROMAX Z-PAK) 250 MG tablet 1 Z-Pack; follow package directions (Patient not taking: Reported on 05/12/2017) 6 each 0  . dexamethasone (DECADRON) 4 MG tablet Take 2 tablets by mouth once a day on the day after chemotherapy  and then take 2 tablets two times a day for 2 days. Take with food. (Patient not taking: Reported on 05/12/2017) 30 tablet 1  . dexamethasone (DECADRON) 4 MG tablet Take 1 tablet (4 mg total) by mouth 2 (two) times daily with a meal. (Patient not taking: Reported on 05/12/2017) 20 tablet 0  . EPINEPHrine 0.3 mg/0.3 mL IJ SOAJ injection Inject 0.3 mg into the muscle once.     Marland Kitchen glucagon (GLUCAGON EMERGENCY) 1 MG injection Inject 1 mg into the muscle once as needed.     . lidocaine-prilocaine (EMLA) cream Apply 1 application topically as needed. (Patient not taking: Reported on 05/12/2017) 30 g 0  . magic mouthwash w/lidocaine SOLN Take 5 mLs by mouth 4 (four) times daily. Swish and spit (Patient not taking: Reported on 05/12/2017) 240 mL 0   No current facility-administered medications for this visit.     Review of Systems:  GENERAL:  Feels "ok".  No fevers or sweats.  Weight loss of 11 pounds since most recent visit. PERFORMANCE STATUS (ECOG):  1 HEENT:  Mucositis and associated thick secretions, improved. (+) sore throat. Tinnitus.  No visual changes, runny nose, mouth sores or tenderness. Lungs: Shortness of breath on exertion.  No cough.  No hemoptysis.  Sleep apnea. Cardiac:  No chest pain, palpitations, orthopnea, or PND. GI:  Eating "ok" (see HPI).  No nausea, vomiting, diarrhea, melena or hematochezia.  Hemorrhoids. GU:  No urgency, frequency, dysuria, or hematuria. On Flomax. Musculoskeletal:  No back pain.  No joint pain.  No muscle tenderness. Extremities:  No pain or swelling. Skin:  No severe flares or urticaria. Neuro:  No headache, numbness or weakness, balance or coordination issues. Endocrine:  Diabetes.  No tthyroid issues, hot flashes or  night sweats. Psych:  No mood changes, depression or anxiety.  Poor sleep. Pain:  Neck pain (5 out of 10). Review of systems:  All other systems reviewed and found   Physical Exam: Blood pressure (!) 157/57, pulse (!) 109, temperature 99.2 F (37.3 C), temperature source Tympanic, resp. rate 20, weight (!) 360 lb (163.3 kg). GENERAL:  Well developed, well nourished, overweight gentleman sitting comfortably in the exam room in no acute distress. MENTAL STATUS:  Alert and oriented to person, place and time. HEAD:  Short dark crewcut.  Normocephalic, atraumatic, face symmetric, no Cushingoid features. EYES:  Glasses.  Pupils equal round and reactive to light and accomodation.  No conjunctivitis or scleral icterus. ENT:  Oropharynx with minimal mucositis posterior pharynx/uvula; improved.  Tongue normal.  Mucous membranes moist.  NECK:  Supple with resolved left cervical adenopathy. RESPIRATORY:  Clear to auscultation without rales, wheezes or rhonchi. CARDIOVASCULAR:  Regular rate and rhythm without murmur, rub or gallop. ABDOMEN:  Soft, non-tender, with active bowel sounds, and no appreciable hepatosplenomegaly.  No masses. SKIN:  No rashes or skin changes.  EXTREMITIES: No edema, no skin discoloration or tenderness.  No palpable cords. LYMPH NODES:  No palpable cervical, supraclavicular, axillary or inguinal adenopathy  NEUROLOGICAL: Unremarkable. PSYCH:  Appropriate.   Appointment on 05/12/2017  Component Date Value Ref Range Status  . WBC 05/12/2017 4.0  3.8 - 10.6 K/uL Final  . RBC 05/12/2017 2.16* 4.40 - 5.90 MIL/uL Final  . Hemoglobin 05/12/2017 8.1* 13.0 - 18.0 g/dL Final  . HCT 05/12/2017 23.7* 40.0 - 52.0 % Final  . MCV 05/12/2017 110.0* 80.0 - 100.0 fL Final  . MCH 05/12/2017 37.3* 26.0 - 34.0 pg Final  . MCHC 05/12/2017 33.9  32.0 -  36.0 g/dL Final  . RDW 05/12/2017 23.5* 11.5 - 14.5 % Final  . Platelets 05/12/2017 222  150 - 440 K/uL Final  . Neutrophils Relative %  05/12/2017 63  % Final  . Neutro Abs 05/12/2017 2.5  1.4 - 6.5 K/uL Final  . Lymphocytes Relative 05/12/2017 23  % Final  . Lymphs Abs 05/12/2017 0.9* 1.0 - 3.6 K/uL Final  . Monocytes Relative 05/12/2017 13  % Final  . Monocytes Absolute 05/12/2017 0.5  0.2 - 1.0 K/uL Final  . Eosinophils Relative 05/12/2017 1  % Final  . Eosinophils Absolute 05/12/2017 0.0  0 - 0.7 K/uL Final  . Basophils Relative 05/12/2017 0  % Final  . Basophils Absolute 05/12/2017 0.0  0 - 0.1 K/uL Final  . Sodium 05/12/2017 141  135 - 145 mmol/L Final  . Potassium 05/12/2017 4.2  3.5 - 5.1 mmol/L Final  . Chloride 05/12/2017 106  101 - 111 mmol/L Final  . CO2 05/12/2017 28  22 - 32 mmol/L Final  . Glucose, Bld 05/12/2017 105* 65 - 99 mg/dL Final  . BUN 05/12/2017 10  6 - 20 mg/dL Final  . Creatinine, Ser 05/12/2017 0.88  0.61 - 1.24 mg/dL Final  . Calcium 05/12/2017 8.9  8.9 - 10.3 mg/dL Final  . Total Protein 05/12/2017 6.9  6.5 - 8.1 g/dL Final  . Albumin 05/12/2017 4.1  3.5 - 5.0 g/dL Final  . AST 05/12/2017 25  15 - 41 U/L Final  . ALT 05/12/2017 34  17 - 63 U/L Final  . Alkaline Phosphatase 05/12/2017 69  38 - 126 U/L Final  . Total Bilirubin 05/12/2017 0.6  0.3 - 1.2 mg/dL Final  . GFR calc non Af Amer 05/12/2017 >60  >60 mL/min Final  . GFR calc Af Amer 05/12/2017 >60  >60 mL/min Final   Comment: (NOTE) The eGFR has been calculated using the CKD EPI equation. This calculation has not been validated in all clinical situations. eGFR's persistently <60 mL/min signify possible Chronic Kidney Disease.   . Anion gap 05/12/2017 7  5 - 15 Final  . Magnesium 05/12/2017 1.5* 1.7 - 2.4 mg/dL Final    Assessment:  Tavoris Brisk is a 46 y.o. 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  Excisional left neck node biopsy on 02/17/2017 revealed metastatic squamous cell carcinoma, P16 positive.  HPV in situ hybridization was positive (16/18).  EBV ISH was negative.   Galactose alpha 1,3 was negative.  Neck CT on 03/25/2018revealed bulky (left >right) cervical lymphadenopathy. The largest nodes wereat the left level II station measuring up to 4.4 cm long axis (2.5-3 cm short axis). There were smaller but abnormally rounded lymph nodes throughout the neck elsewhere. There were nocystic or necrotic nodes. There was superimposed nonspecific appearing inflammatory process at the right level 1B nodal station, with associated overlying cellulitis. There was no associated fluid collection.  Chest CT on 03/26/2018revealed scattered upper normal to mildly enlarged lymph nodes in the mediastinum and upper abdomen come nonspecific for reactive versus neoplastic etiology. An index right hilar lymph node measures 1.1 cm in short axis.  PET scan on 04/09/2018revealed focal hypermetabolism in the left posterior oropharynx centered at the left base of tongue with associated asymmetric soft tissue fullness on the CT images, compatible with primary oropharyngeal carcinoma. There was hypermetabolic left level II cervical nodal metastases. There was no hypermetabolic contralateral cervical nodal metastases or distant metastatic disease. Clinical stageis T2N1M0.  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 Tryptase was 32.3 (2.2-13.2) on 07/29/2016. He is on several medications: cetirizine (Zyrtec), cimetidine (Tagamet), cromolyn (Gastrocrom), and Gleevec (400 mg a day). He uses an epinephrine pen 3-4 times a year.  He was on a trial ofinterferonin early 2016 x 77month. Interferon was discontinued in 07/2015. Interferon did not improve his symptoms. He experienceduncontrolled diabetes and elevated liver function tests with interferon.  [facility]. There were small poorly defined areas of apparent spindle  cells which did not stained positive for mastcells. CK mutation was negative. There were rare atypical cells expressing CD-117 and CD-25. The cells represented approximately 0.1% of the leukocytes.   [facility] [place] of persistent systemic mastocytosis (5-10% of cellularity). There was no increase in blast or overt dyspoiesis. There was moderate reticulin fibrosis. Storage iron was present. There were no ring sideroblasts. Flow cytometry revealed no phenotypic evidence of lymphoma or increased blasts. Cytogenetics were normal (46, XY). There was no evidence of KIT(D816V) point mutation by PCR.   He has osteopeniaon bone density study in 2014. He is on calcium and vitamin D.  He was diagnosed with B12 deficiency.  He began weekly B12 x 6 on 06/29/2017.  He began radiation on 03/08/2017.  He received weekly cisplatin (40 mg/m2) from 03/11/2017 04/29/2017.  Radiation was held for 2 days after cycle #4 and for a week after cycle #5 secondary to mucositis.  Radiation completed on 05/04/2017.  Symptomatically, mucositis has dramatically improved, however he is still experiencing varying degrees of sore throat.  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  Left sided cervical adenopathy has resolved.  He has progressive anemia. Discussed nutrition consult due to swallowing issues; patient declined. Today, CBC reveled a hemoglobin of 8.1, hematocrit of 23.7, and platelets of 222.  Magnesium level depressed at 1.5. Potassium normal at 4.2.  Plan: 1.  Labs today:  CBC with diff, CMP, Mg. 2.  Continue weekly B12 x 4 (next due 05/13/2017) then monthly. 3.  Port flush every 4-6 weeks (last done 04/29/2017). 4.  PET scan on 08/04/2017: end of therapy scan. 5.  Magnesium oxide 2 tabs po daily x 2 days then 1 tablet a day.  Recheck result next week.  6.  RTC on 05/20/2017 for labs (CBC with diff, BMP and Mg) 7.  RTC in 1  month for MD assessment and labs (CBC with diff, CMP, Mg).   Dwayne Asal MD  05/12/2017, 5:03 PM

## 2017-05-13 ENCOUNTER — Inpatient Hospital Stay: Payer: BLUE CROSS/BLUE SHIELD

## 2017-05-13 DIAGNOSIS — C01 Malignant neoplasm of base of tongue: Secondary | ICD-10-CM | POA: Diagnosis not present

## 2017-05-13 DIAGNOSIS — E538 Deficiency of other specified B group vitamins: Secondary | ICD-10-CM

## 2017-05-13 MED ORDER — CYANOCOBALAMIN 1000 MCG/ML IJ SOLN
1000.0000 ug | Freq: Once | INTRAMUSCULAR | Status: AC
  Administered 2017-05-13: 1000 ug via INTRAMUSCULAR
  Filled 2017-05-13: qty 1

## 2017-05-20 ENCOUNTER — Inpatient Hospital Stay: Payer: BLUE CROSS/BLUE SHIELD

## 2017-05-20 DIAGNOSIS — D649 Anemia, unspecified: Secondary | ICD-10-CM

## 2017-05-20 DIAGNOSIS — R638 Other symptoms and signs concerning food and fluid intake: Secondary | ICD-10-CM

## 2017-05-20 DIAGNOSIS — C01 Malignant neoplasm of base of tongue: Secondary | ICD-10-CM | POA: Diagnosis not present

## 2017-05-20 DIAGNOSIS — E538 Deficiency of other specified B group vitamins: Secondary | ICD-10-CM

## 2017-05-20 LAB — BASIC METABOLIC PANEL
Anion gap: 6 (ref 5–15)
BUN: 11 mg/dL (ref 6–20)
CO2: 29 mmol/L (ref 22–32)
Calcium: 8.9 mg/dL (ref 8.9–10.3)
Chloride: 103 mmol/L (ref 101–111)
Creatinine, Ser: 0.92 mg/dL (ref 0.61–1.24)
GFR calc Af Amer: >60 mL/min (ref >60)
GFR calc non Af Amer: >60 mL/min (ref >60)
Glucose, Bld: 135 mg/dL — ABNORMAL HIGH (ref 65–99)
Potassium: 4.4 mmol/L (ref 3.5–5.1)
Sodium: 138 mmol/L (ref 135–145)

## 2017-05-20 LAB — CBC WITH DIFFERENTIAL/PLATELET
Basophils Absolute: 0 10*3/uL (ref 0–0.1)
Basophils Relative: 0 %
Eosinophils Absolute: 0.1 10*3/uL (ref 0–0.7)
Eosinophils Relative: 2 %
HCT: 26.3 % — ABNORMAL LOW (ref 40.0–52.0)
Hemoglobin: 8.8 g/dL — ABNORMAL LOW (ref 13.0–18.0)
Lymphocytes Relative: 34 %
Lymphs Abs: 1.4 10*3/uL (ref 1.0–3.6)
MCH: 36.8 pg — ABNORMAL HIGH (ref 26.0–34.0)
MCHC: 33.5 g/dL (ref 32.0–36.0)
MCV: 109.9 fL — ABNORMAL HIGH (ref 80.0–100.0)
Monocytes Absolute: 0.5 10*3/uL (ref 0.2–1.0)
Monocytes Relative: 13 %
Neutro Abs: 2.2 10*3/uL (ref 1.4–6.5)
Neutrophils Relative %: 51 %
Platelets: 192 10*3/uL (ref 150–440)
RBC: 2.39 MIL/uL — ABNORMAL LOW (ref 4.40–5.90)
RDW: 20.6 % — ABNORMAL HIGH (ref 11.5–14.5)
WBC: 4.2 10*3/uL (ref 3.8–10.6)

## 2017-05-20 LAB — MAGNESIUM: Magnesium: 1.9 mg/dL (ref 1.7–2.4)

## 2017-05-20 MED ORDER — CYANOCOBALAMIN 1000 MCG/ML IJ SOLN
1000.0000 ug | Freq: Once | INTRAMUSCULAR | Status: AC
  Administered 2017-05-20: 1000 ug via INTRAMUSCULAR

## 2017-05-23 ENCOUNTER — Encounter: Payer: Self-pay | Admitting: Otolaryngology

## 2017-05-27 ENCOUNTER — Inpatient Hospital Stay: Payer: BLUE CROSS/BLUE SHIELD | Attending: Hematology and Oncology

## 2017-05-27 DIAGNOSIS — Z794 Long term (current) use of insulin: Secondary | ICD-10-CM | POA: Diagnosis not present

## 2017-05-27 DIAGNOSIS — D4702 Systemic mastocytosis: Secondary | ICD-10-CM | POA: Insufficient documentation

## 2017-05-27 DIAGNOSIS — M8580 Other specified disorders of bone density and structure, unspecified site: Secondary | ICD-10-CM | POA: Insufficient documentation

## 2017-05-27 DIAGNOSIS — Z79899 Other long term (current) drug therapy: Secondary | ICD-10-CM | POA: Insufficient documentation

## 2017-05-27 DIAGNOSIS — C770 Secondary and unspecified malignant neoplasm of lymph nodes of head, face and neck: Secondary | ICD-10-CM | POA: Diagnosis not present

## 2017-05-27 DIAGNOSIS — E119 Type 2 diabetes mellitus without complications: Secondary | ICD-10-CM | POA: Diagnosis not present

## 2017-05-27 DIAGNOSIS — K219 Gastro-esophageal reflux disease without esophagitis: Secondary | ICD-10-CM | POA: Insufficient documentation

## 2017-05-27 DIAGNOSIS — Z923 Personal history of irradiation: Secondary | ICD-10-CM | POA: Diagnosis not present

## 2017-05-27 DIAGNOSIS — E538 Deficiency of other specified B group vitamins: Secondary | ICD-10-CM | POA: Diagnosis not present

## 2017-05-27 DIAGNOSIS — Z87891 Personal history of nicotine dependence: Secondary | ICD-10-CM | POA: Insufficient documentation

## 2017-05-27 DIAGNOSIS — Z7982 Long term (current) use of aspirin: Secondary | ICD-10-CM | POA: Insufficient documentation

## 2017-05-27 DIAGNOSIS — G4730 Sleep apnea, unspecified: Secondary | ICD-10-CM | POA: Insufficient documentation

## 2017-05-27 DIAGNOSIS — R682 Dry mouth, unspecified: Secondary | ICD-10-CM | POA: Insufficient documentation

## 2017-05-27 DIAGNOSIS — F329 Major depressive disorder, single episode, unspecified: Secondary | ICD-10-CM | POA: Diagnosis not present

## 2017-05-27 DIAGNOSIS — J449 Chronic obstructive pulmonary disease, unspecified: Secondary | ICD-10-CM | POA: Insufficient documentation

## 2017-05-27 DIAGNOSIS — K117 Disturbances of salivary secretion: Secondary | ICD-10-CM | POA: Insufficient documentation

## 2017-05-27 DIAGNOSIS — B370 Candidal stomatitis: Secondary | ICD-10-CM | POA: Diagnosis not present

## 2017-05-27 DIAGNOSIS — G629 Polyneuropathy, unspecified: Secondary | ICD-10-CM | POA: Diagnosis not present

## 2017-05-27 DIAGNOSIS — Z9221 Personal history of antineoplastic chemotherapy: Secondary | ICD-10-CM | POA: Diagnosis not present

## 2017-05-27 DIAGNOSIS — I10 Essential (primary) hypertension: Secondary | ICD-10-CM | POA: Diagnosis not present

## 2017-05-27 DIAGNOSIS — D649 Anemia, unspecified: Secondary | ICD-10-CM | POA: Diagnosis not present

## 2017-05-27 DIAGNOSIS — C01 Malignant neoplasm of base of tongue: Secondary | ICD-10-CM | POA: Insufficient documentation

## 2017-05-27 DIAGNOSIS — R1310 Dysphagia, unspecified: Secondary | ICD-10-CM | POA: Insufficient documentation

## 2017-05-27 MED ORDER — CYANOCOBALAMIN 1000 MCG/ML IJ SOLN
1000.0000 ug | Freq: Once | INTRAMUSCULAR | Status: AC
  Administered 2017-05-27: 1000 ug via INTRAMUSCULAR

## 2017-06-03 ENCOUNTER — Inpatient Hospital Stay: Payer: BLUE CROSS/BLUE SHIELD

## 2017-06-03 DIAGNOSIS — C01 Malignant neoplasm of base of tongue: Secondary | ICD-10-CM | POA: Diagnosis not present

## 2017-06-03 DIAGNOSIS — E538 Deficiency of other specified B group vitamins: Secondary | ICD-10-CM

## 2017-06-03 MED ORDER — CYANOCOBALAMIN 1000 MCG/ML IJ SOLN
1000.0000 ug | Freq: Once | INTRAMUSCULAR | Status: AC
  Administered 2017-06-03: 1000 ug via INTRAMUSCULAR
  Filled 2017-06-03: qty 1

## 2017-06-06 ENCOUNTER — Ambulatory Visit: Payer: BLUE CROSS/BLUE SHIELD | Admitting: Radiation Oncology

## 2017-06-09 ENCOUNTER — Other Ambulatory Visit: Payer: BLUE CROSS/BLUE SHIELD

## 2017-06-09 ENCOUNTER — Ambulatory Visit: Payer: BLUE CROSS/BLUE SHIELD | Admitting: Hematology and Oncology

## 2017-06-10 ENCOUNTER — Inpatient Hospital Stay (HOSPITAL_BASED_OUTPATIENT_CLINIC_OR_DEPARTMENT_OTHER): Payer: BLUE CROSS/BLUE SHIELD | Admitting: Hematology and Oncology

## 2017-06-10 ENCOUNTER — Inpatient Hospital Stay: Payer: BLUE CROSS/BLUE SHIELD

## 2017-06-10 ENCOUNTER — Encounter: Payer: Self-pay | Admitting: Hematology and Oncology

## 2017-06-10 VITALS — BP 135/81 | HR 103 | Temp 97.5°F | Ht 75.0 in | Wt 358.0 lb

## 2017-06-10 DIAGNOSIS — F329 Major depressive disorder, single episode, unspecified: Secondary | ICD-10-CM | POA: Diagnosis not present

## 2017-06-10 DIAGNOSIS — R682 Dry mouth, unspecified: Secondary | ICD-10-CM | POA: Diagnosis not present

## 2017-06-10 DIAGNOSIS — R1310 Dysphagia, unspecified: Secondary | ICD-10-CM

## 2017-06-10 DIAGNOSIS — E538 Deficiency of other specified B group vitamins: Secondary | ICD-10-CM

## 2017-06-10 DIAGNOSIS — D4702 Systemic mastocytosis: Secondary | ICD-10-CM

## 2017-06-10 DIAGNOSIS — C01 Malignant neoplasm of base of tongue: Secondary | ICD-10-CM | POA: Diagnosis not present

## 2017-06-10 DIAGNOSIS — K117 Disturbances of salivary secretion: Secondary | ICD-10-CM

## 2017-06-10 DIAGNOSIS — C770 Secondary and unspecified malignant neoplasm of lymph nodes of head, face and neck: Secondary | ICD-10-CM | POA: Diagnosis not present

## 2017-06-10 DIAGNOSIS — Z79899 Other long term (current) drug therapy: Secondary | ICD-10-CM

## 2017-06-10 DIAGNOSIS — B370 Candidal stomatitis: Secondary | ICD-10-CM

## 2017-06-10 DIAGNOSIS — Z9221 Personal history of antineoplastic chemotherapy: Secondary | ICD-10-CM

## 2017-06-10 DIAGNOSIS — D649 Anemia, unspecified: Secondary | ICD-10-CM | POA: Diagnosis not present

## 2017-06-10 DIAGNOSIS — Z7982 Long term (current) use of aspirin: Secondary | ICD-10-CM

## 2017-06-10 DIAGNOSIS — E119 Type 2 diabetes mellitus without complications: Secondary | ICD-10-CM

## 2017-06-10 DIAGNOSIS — Z87891 Personal history of nicotine dependence: Secondary | ICD-10-CM

## 2017-06-10 DIAGNOSIS — Z794 Long term (current) use of insulin: Secondary | ICD-10-CM

## 2017-06-10 DIAGNOSIS — M8580 Other specified disorders of bone density and structure, unspecified site: Secondary | ICD-10-CM

## 2017-06-10 DIAGNOSIS — J449 Chronic obstructive pulmonary disease, unspecified: Secondary | ICD-10-CM | POA: Diagnosis not present

## 2017-06-10 DIAGNOSIS — K219 Gastro-esophageal reflux disease without esophagitis: Secondary | ICD-10-CM

## 2017-06-10 DIAGNOSIS — G629 Polyneuropathy, unspecified: Secondary | ICD-10-CM

## 2017-06-10 DIAGNOSIS — G4730 Sleep apnea, unspecified: Secondary | ICD-10-CM

## 2017-06-10 DIAGNOSIS — I10 Essential (primary) hypertension: Secondary | ICD-10-CM

## 2017-06-10 DIAGNOSIS — Z923 Personal history of irradiation: Secondary | ICD-10-CM

## 2017-06-10 LAB — CBC WITH DIFFERENTIAL/PLATELET
Basophils Absolute: 0 10*3/uL (ref 0–0.1)
Basophils Relative: 0 %
Eosinophils Absolute: 0.2 10*3/uL (ref 0–0.7)
Eosinophils Relative: 5 %
HCT: 29.4 % — ABNORMAL LOW (ref 40.0–52.0)
Hemoglobin: 9.7 g/dL — ABNORMAL LOW (ref 13.0–18.0)
Lymphocytes Relative: 22 %
Lymphs Abs: 1.1 10*3/uL (ref 1.0–3.6)
MCH: 35 pg — ABNORMAL HIGH (ref 26.0–34.0)
MCHC: 32.9 g/dL (ref 32.0–36.0)
MCV: 106.3 fL — ABNORMAL HIGH (ref 80.0–100.0)
Monocytes Absolute: 0.5 10*3/uL (ref 0.2–1.0)
Monocytes Relative: 10 %
Neutro Abs: 3.1 10*3/uL (ref 1.4–6.5)
Neutrophils Relative %: 63 %
Platelets: 243 10*3/uL (ref 150–440)
RBC: 2.77 MIL/uL — ABNORMAL LOW (ref 4.40–5.90)
RDW: 16.9 % — ABNORMAL HIGH (ref 11.5–14.5)
WBC: 4.9 10*3/uL (ref 3.8–10.6)

## 2017-06-10 LAB — COMPREHENSIVE METABOLIC PANEL
ALT: 40 U/L (ref 17–63)
AST: 32 U/L (ref 15–41)
Albumin: 4.1 g/dL (ref 3.5–5.0)
Alkaline Phosphatase: 66 U/L (ref 38–126)
Anion gap: 6 (ref 5–15)
BUN: 11 mg/dL (ref 6–20)
CO2: 26 mmol/L (ref 22–32)
Calcium: 9 mg/dL (ref 8.9–10.3)
Chloride: 108 mmol/L (ref 101–111)
Creatinine, Ser: 0.84 mg/dL (ref 0.61–1.24)
GFR calc Af Amer: >60 mL/min (ref >60)
GFR calc non Af Amer: >60 mL/min (ref >60)
Glucose, Bld: 96 mg/dL (ref 65–99)
Potassium: 4 mmol/L (ref 3.5–5.1)
Sodium: 140 mmol/L (ref 135–145)
Total Bilirubin: 0.5 mg/dL (ref 0.3–1.2)
Total Protein: 7 g/dL (ref 6.5–8.1)

## 2017-06-10 LAB — MAGNESIUM: Magnesium: 1.8 mg/dL (ref 1.7–2.4)

## 2017-06-10 MED ORDER — SODIUM CHLORIDE 0.9% FLUSH
10.0000 mL | Freq: Once | INTRAVENOUS | Status: AC
  Administered 2017-06-10: 10 mL via INTRAVENOUS
  Filled 2017-06-10: qty 10

## 2017-06-10 MED ORDER — CLOTRIMAZOLE 10 MG MT LOZG: 10.0000 mg | LOZENGE | Freq: Every day | OROMUCOSAL | 1 refills | Status: AC

## 2017-06-10 MED ORDER — HEPARIN SOD (PORK) LOCK FLUSH 100 UNIT/ML IV SOLN
500.0000 [IU] | Freq: Once | INTRAVENOUS | Status: AC
  Administered 2017-06-10: 500 [IU] via INTRAVENOUS

## 2017-06-10 MED ORDER — SODIUM CHLORIDE 0.9% FLUSH
10.0000 mL | Freq: Once | INTRAVENOUS | Status: AC
Start: 2017-06-10 — End: 2017-06-10
  Administered 2017-06-10: 10 mL via INTRAVENOUS
  Filled 2017-06-10: qty 10

## 2017-06-10 NOTE — Progress Notes (Signed)
[facility] day:  06/10/2017   Chief Complaint: Dwayne Richard is a 47 y.o. 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  HPI:  The patient was last seen in the medical oncology clinic by me on 05/12/2017.  At that time, he had mild tinnitus.  He received week #6 cisplatin with radiation.   Mucositis had dramatically improved s/p radiation.  He was still experiencing varying degrees of sore throat.  He had tinnitus secondary to cisplatin.  Left sided cervical adenopathy had resolved.  He had progressive anemia. CBC reveled a hemoglobin of 8.1, hematocrit of 23.7, and platelets of 222,000.  Magnesium was 1.5. Potassium normal at 4.2.  He was on oral magnesium.  During the interim, he notes trouble swallowing.  Discomfort is worse since his last visit despite being further way from his radiation. Mouth is dry. He is taking a magnesium twice daily. He notes no diarrhea.  He had 9 teeth pulled on 06/06/2017 and plans to have the 11 removed in the near future.   Past Medical History:  Diagnosis Date  . COPD (chronic obstructive pulmonary disease) ([place])   . Depression   . Diabetes mellitus without complication ([place])   . GERD (gastroesophageal reflux disease)   . Hypertension   . Neuropathy    from knees down, "tingling" in hands  . S/P insertion of spinal cord stimulator    (x2)  . Sleep apnea   . Systemic mastocytosis    Per pt    Past Surgical History:  Procedure Laterality Date  . cataract surgery  08/06/2014  . cataract surgery  11/05/2014  . MASS BIOPSY Left 02/17/2017   Procedure: NECK MASS BIOPSY;  Surgeon: Margaretha Sheffield, MD;  Location: [facility];  Service: ENT;  Laterality: Left;  NEED HARMONIC SCAPLE diabetic - insulin and oral meds sleep apnea  . PORTA CATH INSERTION N/A 02/22/2017   Procedure: Glori Luis Cath Insertion;  Surgeon: Algernon Huxley, MD;   Location: [facility];  Service: Cardiovascular;  Laterality: N/A;  . SEPTOPLASTY  12/15/2011  . Spinal implants  10/06/2011  . TYMPANOSTOMY TUBE PLACEMENT Right 11/03/2011  . X-STOP IMPLANTATION Right 07/25/2013    Family History  Problem Relation Age of Onset  . Diabetes Father   . Hypertension Father   . Diabetes Paternal Grandmother     Social History:  reports that he quit smoking about 2 years ago. He has never used smokeless tobacco. He reports that he does not drink alcohol or use drugs.  He and his wife moved from [place].  He lives in [place].  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  The patient is accompanied by his wife, Stanton Kidney.  Allergies: No Known Allergies  Current Medications: Current Outpatient Prescriptions  Medication Sig Dispense Refill  . amitriptyline (ELAVIL) 25 MG tablet Take 25 mg by mouth daily.    Marland Kitchen amLODipine (NORVASC) 5 MG tablet Take 5 mg by mouth daily.    Marland Kitchen aspirin EC 81 MG tablet Take 81 mg by mouth 2 (two) times daily.    Marland Kitchen atorvastatin (LIPITOR) 20 MG tablet Take 20 mg by mouth daily.     . Calcium Citrate 200 MG TABS Take 1,000 mg by mouth 2 (two) times daily.     . Cetirizine HCl 10 MG CAPS Take 10 mg by mouth 2 (two) times daily.     Marland Kitchen  cimetidine (TAGAMET) 400 MG tablet Take 400 mg by mouth 2 (two) times daily.     . cromolyn (GASTROCROM) 100 MG/5ML solution Take 200 mg by mouth 4 (four) times daily -  before meals and at bedtime.     . diphenhydrAMINE (BENADRYL) 50 MG capsule Take 50 mg by mouth 2 (two) times daily.     Marland Kitchen doxepin (SINEQUAN) 10 MG/ML solution Take 2.5 ml [74m] in 5 ml of water; Oral rinse for 1 minute; and spit out once a day. 120 mL 1  . EPINEPHrine 0.3 mg/0.3 mL IJ SOAJ injection Inject 0.3 mg into the muscle once.     . fluticasone (FLONASE) 50 MCG/ACT nasal spray Place 2 sprays into both nostrils daily.     .Marland Kitchengabapentin (NEURONTIN) 600 MG tablet Take 1,200 mg by mouth 3 (three) times daily.     .Marland KitchenGLEEVEC 400 MG tablet Take 1  tablet (400 mg total) by mouth daily. Take with meals and large glass of water.Caution:Chemotherapy. 90 tablet 0  . glimepiride (AMARYL) 4 MG tablet Take 4 mg by mouth 2 (two) times daily.     .Marland Kitchenglucagon (GLUCAGON EMERGENCY) 1 MG injection Inject 1 mg into the muscle once as needed.     . Insulin Glargine (LANTUS [place]) Inject 100 Units into the skin daily.     . insulin regular (NOVOLIN R,HUMULIN R) 100 units/mL injection Inject 90 Units into the skin 3 (three) times daily as needed.     . Insulin Syringe-Needle U-100 (B-D INS SYR ULTRAFINE .3CC/30G) 30G X 1/2" 0.3 ML MISC Use 1 Syringe as directed.    . Lancets 28G MISC Use 1 each 3 (three) times daily. Use as instructed.    . lidocaine-prilocaine (EMLA) cream Apply 1 application topically as needed. 30 g 0  . LORazepam (ATIVAN) 0.5 MG tablet Take 1 tablet (0.5 mg total) by mouth every 6 (six) hours as needed (Nausea or vomiting). 30 tablet 0  . losartan (COZAAR) 100 MG tablet Take 100 mg by mouth daily.     . metFORMIN (GLUCOPHAGE) 1000 MG tablet Take 2,000 mg by mouth daily with breakfast.     . Multiple Vitamin (MULTIVITAMIN) tablet Take 1 tablet by mouth daily.    .Marland Kitchenomeprazole (PRILOSEC) 20 MG capsule Take 20 mg by mouth 2 (two) times daily before a meal.     . oxyCODONE-acetaminophen (PERCOCET) 10-325 MG tablet Take 1 tablet by mouth every 4 (four) hours as needed for pain. 30 tablet 0  . phenazopyridine (PYRIDIUM) 200 MG tablet Take 200 mg by mouth 2 (two) times daily.     . tamsulosin (FLOMAX) 0.4 MG CAPS capsule Take 0.4 mg by mouth daily.     .Marland KitchentiZANidine (ZANAFLEX) 4 MG capsule Take 4 mg by mouth daily.     . traMADol (ULTRAM) 50 MG tablet Take 50 mg by mouth 3 (three) times daily.     .Marland Kitchenzolpidem (AMBIEN CR) 12.5 MG CR tablet Take 12.5 mg by mouth at bedtime.      No current facility-administered medications for this visit.     Review of Systems:  GENERAL:  Feels "ok".  No fevers or sweats.  Weight loss of 2 pounds since most recent  visit. PERFORMANCE STATUS (ECOG):  1 HEENT:  Throat tender.  Intermittent tinnitus.  Dental extractions (see HPI).  No visual changes, runny nose, mouth sores or tenderness. Lungs: No shortness of breath.  No cough.  No hemoptysis.  Sleep apnea. Cardiac:  No  chest pain, palpitations, orthopnea, or PND. GI:  Trouble swallowing.  No nausea, vomiting, diarrhea, melena or hematochezia.  GU:  No urgency, frequency, dysuria, or hematuria. On Flomax. Musculoskeletal:  No back pain.  No joint pain.  No muscle tenderness. Extremities:  No pain or swelling. Skin:  No severe flares or urticaria. Neuro:  No headache, numbness or weakness, balance or coordination issues. Endocrine:  Diabetes.  No tthyroid issues, hot flashes or night sweats. Psych:  No mood changes, depression or anxiety.  Poor sleep. Pain:  Left neck sore. Review of systems:  All other systems reviewed and found   Physical Exam: Blood pressure 135/81, pulse (!) 103, temperature (!) 97.5 F (36.4 C), temperature source Tympanic, height _0  (1.905 m), weight (!) 358 lb (162.4 kg). GENERAL:  Well developed, well nourished, overweight gentleman sitting comfortably in the exam room in no acute distress. MENTAL STATUS:  Alert and oriented to person, place and time. HEAD:  Short dark crewcut.  Normocephalic, atraumatic, face symmetric, no Cushingoid features. EYES:  Glasses.  Pupils equal round and reactive to light and accomodation.  No conjunctivitis or scleral icterus. ENT:  Oropharynx clear with resolution of mucositis or thick secretions.   Tongue thrush.  s/p extractions.  Mucous membranes dry.  NECK:  Supple with tender left neck.  No discrete adenopathy.  No erythema or induration. RESPIRATORY:  Clear to auscultation without rales, wheezes or rhonchi. CARDIOVASCULAR:  Regular rate and rhythm without murmur, rub or gallop. ABDOMEN:  Soft, non-tender, with active bowel sounds, and no appreciable hepatosplenomegaly.  No masses. SKIN:   No rashes or skin changes.  EXTREMITIES: No edema, no skin discoloration or tenderness.  No palpable cords. LYMPH NODES:  No palpable cervical, supraclavicular, axillary or inguinal adenopathy  NEUROLOGICAL: Unremarkable. PSYCH:  Appropriate.   Office Visit on 06/10/2017  Component Date Value Ref Range Status  . WBC 06/10/2017 4.9  3.8 - 10.6 K/uL Final  . RBC 06/10/2017 2.77* 4.40 - 5.90 MIL/uL Final  . Hemoglobin 06/10/2017 9.7* 13.0 - 18.0 g/dL Final  . HCT 06/10/2017 29.4* 40.0 - 52.0 % Final  . MCV 06/10/2017 106.3* 80.0 - 100.0 fL Final  . MCH 06/10/2017 35.0* 26.0 - 34.0 pg Final  . MCHC 06/10/2017 32.9  32.0 - 36.0 g/dL Final  . RDW 06/10/2017 16.9* 11.5 - 14.5 % Final  . Platelets 06/10/2017 243  150 - 440 K/uL Final  . Neutrophils Relative % 06/10/2017 63  % Final  . Neutro Abs 06/10/2017 3.1  1.4 - 6.5 K/uL Final  . Lymphocytes Relative 06/10/2017 22  % Final  . Lymphs Abs 06/10/2017 1.1  1.0 - 3.6 K/uL Final  . Monocytes Relative 06/10/2017 10  % Final  . Monocytes Absolute 06/10/2017 0.5  0.2 - 1.0 K/uL Final  . Eosinophils Relative 06/10/2017 5  % Final  . Eosinophils Absolute 06/10/2017 0.2  0 - 0.7 K/uL Final  . Basophils Relative 06/10/2017 0  % Final  . Basophils Absolute 06/10/2017 0.0  0 - 0.1 K/uL Final  . Sodium 06/10/2017 140  135 - 145 mmol/L Final  . Potassium 06/10/2017 4.0  3.5 - 5.1 mmol/L Final  . Chloride 06/10/2017 108  101 - 111 mmol/L Final  . CO2 06/10/2017 26  22 - 32 mmol/L Final  . Glucose, Bld 06/10/2017 96  65 - 99 mg/dL Final  . BUN 06/10/2017 11  6 - 20 mg/dL Final  . Creatinine, Ser 06/10/2017 0.84  0.61 - 1.24 mg/dL Final  .  Calcium 06/10/2017 9.0  8.9 - 10.3 mg/dL Final  . Total Protein 06/10/2017 7.0  6.5 - 8.1 g/dL Final  . Albumin 06/10/2017 4.1  3.5 - 5.0 g/dL Final  . AST 06/10/2017 32  15 - 41 U/L Final  . ALT 06/10/2017 40  17 - 63 U/L Final  . Alkaline Phosphatase 06/10/2017 66  38 - 126 U/L Final  . Total Bilirubin  06/10/2017 0.5  0.3 - 1.2 mg/dL Final  . GFR calc non Af Amer 06/10/2017 >60  >60 mL/min Final  . GFR calc Af Amer 06/10/2017 >60  >60 mL/min Final   Comment: (NOTE) The eGFR has been calculated using the CKD EPI equation. This calculation has not been validated in all clinical situations. eGFR's persistently <60 mL/min signify possible Chronic Kidney Disease.   . Anion gap 06/10/2017 6  5 - 15 Final  . Magnesium 06/10/2017 1.8  1.7 - 2.4 mg/dL Final    Assessment:  Dwayne Richard is a 46 y.o. 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  Excisional left neck node biopsy on 02/17/2017 revealed metastatic squamous cell carcinoma, P16 positive.  HPV in situ hybridization was positive (16/18).  EBV ISH was negative.  Galactose alpha 1,3 was negative.  Neck CT on 03/25/2018revealed bulky (left >right) cervical lymphadenopathy. The largest nodes wereat the left level II station measuring up to 4.4 cm long axis (2.5-3 cm short axis). There were smaller but abnormally rounded lymph nodes throughout the neck elsewhere. There were nocystic or necrotic nodes. There was superimposed nonspecific appearing inflammatory process at the right level 1B nodal station, with associated overlying cellulitis. There was no associated fluid collection.  Chest CT on 03/26/2018revealed scattered upper normal to mildly enlarged lymph nodes in the mediastinum and upper abdomen come nonspecific for reactive versus neoplastic etiology. An index right hilar lymph node measures 1.1 cm in short axis.  PET scan on 04/09/2018revealed focal hypermetabolism in the left posterior oropharynx centered at the left base of tongue with associated asymmetric soft tissue fullness on the CT images, compatible with primary oropharyngeal carcinoma. There was hypermetabolic left level II cervical nodal metastases. There was no hypermetabolic contralateral cervical nodal metastases  or distant metastatic disease. Clinical stageis T2N1M0.  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 Tryptase was 32.3 (2.2-13.2) on 07/29/2016. He is on several medications: cetirizine (Zyrtec), cimetidine (Tagamet), cromolyn (Gastrocrom), and Gleevec (400 mg a day). He uses an epinephrine pen 3-4 times a year.  He was on a trial ofinterferonin early 2016 x 34month. Interferon was discontinued in 07/2015. Interferon did not improve his symptoms. He experienceduncontrolled diabetes and elevated liver function tests with interferon.  [facility] [place]. There were small poorly defined areas of apparent spindle cells which did not stained positive for mastcells. CK mutation was negative. There were rare atypical cells expressing CD-117 and CD-25. The cells represented approximately 0.1% of the leukocytes.   [facility] [place] of persistent systemic mastocytosis (5-10% of cellularity). There was no increase in blast or overt dyspoiesis. There was moderate reticulin fibrosis. Storage iron was present. There were no ring sideroblasts. Flow cytometry revealed no phenotypic evidence of lymphoma or increased blasts. Cytogenetics were normal (46, XY). There was no evidence of KIT(D816V) point mutation by PCR.   He has osteopeniaon bone density  study in 2014. He is on calcium and vitamin D.  He was diagnosed with B12 deficiency.  He began weekly B12 x 6 on 04/29/2017 (last 06/03/2017).  He began radiation on 03/08/2017.  He received weekly cisplatin (40 mg/m2) from 03/11/2017 - 04/29/2017.  Radiation completed on 05/04/2017.  Symptomatically, he has trouble swallowing.  He has xerostomia and tongue thrush.  Exam reveals a slight tender left neck.  Hematocrit  is improving.  Magnesium is normal on supplementation.  Plan: 1.  Labs today:  CBC with diff, CMP, Mg. 2.  Discuss xerostomia and treatment with Salivamax. 3.  Discuss thrush.  Begin clotrimazole troches. 4.  Preauth Salivamax. 5.  Rx:  clotrimazole troches. 6.  Schedule f/u with Dr. Kathyrn Sheriff. 7.  PET scan scheduled on 08/04/2017. 8.  RTC monthly for B12 (next due 08/10). 9.  RTC  For MD assessment, labs (CBC with diff, CMP, Mg), and review of PET scan.   Lequita Asal, MD  06/10/2017, 10:16 AM

## 2017-06-10 NOTE — Progress Notes (Signed)
Patient here for follow up no changes since last appointment 

## 2017-06-13 ENCOUNTER — Inpatient Hospital Stay: Payer: BLUE CROSS/BLUE SHIELD

## 2017-06-13 NOTE — Progress Notes (Signed)
Nutrition Assessment   Reason for Assessment:   Poor po intake, weight loss  ASSESSMENT:  46 year old male with stage I squamous cell carcinoma of left base of tongue, P16 positive.  Patient completed radiation on 6/13 and chemotherapy on 6/18.  Past medical history of COPD, DM, GERD, HTN  Patient reports no appetite, only eats about 1 meal per day, sips on liquid during the day.  Patient reports issues with dry mouth and was prescribed salivamax but has not gotten approved by [facility] yet.   Patient reports yesterday sipped on liquids water, diet coke or coke zero and ate pasta dish last night for dinner with sauce, noodles and meatballs.    Nutrition Focused Physical Exam: deferred  Medications: amaryl, lantus, novolin, glucophage, MVI, prilosec  Labs: reviewed  Anthropometrics:   Height: 30ft 3 inches Weight: 358 lb UBW: 380 lb beginning of January per patient. BMI: 44  6% weight loss in the last 7 months   Estimated Energy Needs  Kcals: 2700-3200 calories/d (actual BW and 17-20 kcal/kg) Protein: 133-178 g/d (IBW, 1.5-2.0 g/kg) Fluid: > 2.7 L/d  NUTRITION DIAGNOSIS: Inadequate food and beverage intake related to dry mouth, poor appetite as evidenced by eating 1 meal per day and 6% weight loss in 7 months   MALNUTRITION DIAGNOSIS: continue to monitor   INTERVENTION:   Discussed strategies to manage dry mouth. Fact sheet provided Discussed soft, moist foods and especially foods high in protein with upcoming dental extractions. Discussed chopping/blending/puree foods if needed.   Fact sheets given.   Encouraged patient to try premier protein, carnation instant breakfast for added nutrition.   Encouraged patient to consume small frequent meals to help with blood glucose and getting nutrition that he needs.   Patient will keep check on weigh weekly and encouraged him to loose no more that 1-2 pounds per week.   Encouraged patient to check blood glucose on  regular basis and call clinic if change in blood glucose (either high or low).  MONITORING, EVALUATION, GOAL: Patient will consume adequate calories and protein to minimize weight loss   NEXT VISIT: as needed  Dwayne Richard, [place], [place] Registered Dietitian [phone] (pager)

## 2017-06-16 ENCOUNTER — Ambulatory Visit: Payer: BLUE CROSS/BLUE SHIELD | Admitting: Radiation Oncology

## 2017-06-27 ENCOUNTER — Encounter: Payer: Self-pay | Admitting: Medical Oncology

## 2017-06-27 ENCOUNTER — Emergency Department
Admission: EM | Admit: 2017-06-27 | Discharge: 2017-06-27 | Disposition: A | Discharge status: Home / Self Care | Payer: BLUE CROSS/BLUE SHIELD | Attending: Emergency Medicine | Admitting: Emergency Medicine

## 2017-06-27 ENCOUNTER — Emergency Department: Payer: BLUE CROSS/BLUE SHIELD

## 2017-06-27 DIAGNOSIS — Z87891 Personal history of nicotine dependence: Secondary | ICD-10-CM | POA: Diagnosis not present

## 2017-06-27 DIAGNOSIS — J449 Chronic obstructive pulmonary disease, unspecified: Secondary | ICD-10-CM | POA: Insufficient documentation

## 2017-06-27 DIAGNOSIS — E119 Type 2 diabetes mellitus without complications: Secondary | ICD-10-CM | POA: Diagnosis not present

## 2017-06-27 DIAGNOSIS — I10 Essential (primary) hypertension: Secondary | ICD-10-CM | POA: Insufficient documentation

## 2017-06-27 DIAGNOSIS — J384 Edema of larynx: Secondary | ICD-10-CM | POA: Diagnosis not present

## 2017-06-27 DIAGNOSIS — J392 Other diseases of pharynx: Secondary | ICD-10-CM | POA: Diagnosis not present

## 2017-06-27 DIAGNOSIS — Z7982 Long term (current) use of aspirin: Secondary | ICD-10-CM | POA: Insufficient documentation

## 2017-06-27 DIAGNOSIS — R0602 Shortness of breath: Secondary | ICD-10-CM | POA: Diagnosis present

## 2017-06-27 DIAGNOSIS — Z79899 Other long term (current) drug therapy: Secondary | ICD-10-CM | POA: Insufficient documentation

## 2017-06-27 HISTORY — DX: Malignant (primary) neoplasm, unspecified: C80.1

## 2017-06-27 LAB — CBC WITH DIFFERENTIAL/PLATELET
BASOS ABS: 0.1 10*3/uL (ref 0–0.1)
Basophils Relative: 1 %
EOS ABS: 0.2 10*3/uL (ref 0–0.7)
EOS PCT: 3 %
HCT: 32.6 % — ABNORMAL LOW (ref 40.0–52.0)
Hemoglobin: 10.6 g/dL — ABNORMAL LOW (ref 13.0–18.0)
Lymphocytes Relative: 15 %
Lymphs Abs: 1.1 10*3/uL (ref 1.0–3.6)
MCH: 32.7 pg (ref 26.0–34.0)
MCHC: 32.6 g/dL (ref 32.0–36.0)
MCV: 100.3 fL — ABNORMAL HIGH (ref 80.0–100.0)
Monocytes Absolute: 0.6 10*3/uL (ref 0.2–1.0)
Monocytes Relative: 7 %
Neutro Abs: 5.6 10*3/uL (ref 1.4–6.5)
Neutrophils Relative %: 74 %
PLATELETS: 213 10*3/uL (ref 150–440)
RBC: 3.25 MIL/uL — AB (ref 4.40–5.90)
RDW: 15.7 % — AB (ref 11.5–14.5)
WBC: 7.6 10*3/uL (ref 3.8–10.6)

## 2017-06-27 LAB — BASIC METABOLIC PANEL
ANION GAP: 10 (ref 5–15)
BUN: 13 mg/dL (ref 6–20)
CALCIUM: 9.4 mg/dL (ref 8.9–10.3)
CO2: 27 mmol/L (ref 22–32)
Chloride: 102 mmol/L (ref 101–111)
Creatinine, Ser: 1.11 mg/dL (ref 0.61–1.24)
GFR calc Af Amer: >60 mL/min (ref >60)
Glucose, Bld: 74 mg/dL (ref 65–99)
POTASSIUM: 3.5 mmol/L (ref 3.5–5.1)
SODIUM: 139 mmol/L (ref 135–145)

## 2017-06-27 MED ORDER — DEXAMETHASONE 6 MG PO TABS: 6.0000 mg | ORAL_TABLET | Freq: Three times a day (TID) | ORAL | 0 refills | Status: AC

## 2017-06-27 MED ORDER — AMOXICILLIN-POT CLAVULANATE 875-125 MG PO TABS: 1.0000 | ORAL_TABLET | Freq: Two times a day (BID) | ORAL | 0 refills | Status: AC

## 2017-06-27 MED ORDER — LIDOCAINE HCL 2 % EX GEL
CUTANEOUS | Status: AC
  Filled 2017-06-27: qty 10

## 2017-06-27 MED ORDER — FLUCONAZOLE 100 MG PO TABS
100.0000 mg | ORAL_TABLET | Freq: Two times a day (BID) | ORAL | 0 refills | Status: AC
Start: 2017-06-27 — End: 2017-07-18

## 2017-06-27 MED ORDER — OXYMETAZOLINE HCL 0.05 % NA SOLN
NASAL | Status: AC
  Filled 2017-06-27: qty 15

## 2017-06-27 MED ORDER — IOPAMIDOL (ISOVUE-300) INJECTION 61%
75.0000 mL | Freq: Once | INTRAVENOUS | Status: AC | PRN
  Administered 2017-06-27: 75 mL via INTRAVENOUS

## 2017-06-27 MED ORDER — DEXAMETHASONE SODIUM PHOSPHATE 10 MG/ML IJ SOLN
10.0000 mg | Freq: Once | INTRAMUSCULAR | Status: AC
  Administered 2017-06-27: 10 mg via INTRAVENOUS
  Filled 2017-06-27: qty 1

## 2017-06-27 NOTE — ED Notes (Signed)
Port Accessed ?

## 2017-06-27 NOTE — ED Provider Notes (Signed)
[facility] Emergency Department Provider Note  Time seen: 10:39 AM  I have reviewed the triage vital signs and the nursing notes.   HISTORY  Chief Complaint Shortness of Breath    HPI Dwayne Richard is a 46 y.o. male with a past medical history of COPD, diabetes, depression, gastric reflux, tongue cancer, presents to the emergency department with difficulty swallowing/breathing. According to the patient he has finished chemotherapy and radiation therapy last month. States over the past one week he has had progressively worsening difficulty swallowing and breathing which he states is due to the mass in his throat. Patient states it was really bad yesterday, so he came to the ER today for evaluation.  Past Medical History:  Diagnosis Date  . COPD (chronic obstructive pulmonary disease) ([place])   . Depression   . Diabetes mellitus without complication ([place])   . GERD (gastroesophageal reflux disease)   . Hypertension   . Neuropathy    from knees down, "tingling" in hands  . S/P insertion of spinal cord stimulator    (x2)  . Sleep apnea   . Systemic mastocytosis    Per pt    Patient Active Problem List   Diagnosis Date Noted  . Xerostomia due to radiotherapy 06/10/2017  . Hypomagnesemia 05/12/2017  . B12 deficiency 04/29/2017  . Mucositis due to radiation therapy 03/26/2017  . Encounter for antineoplastic chemotherapy 03/19/2017  . 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 Lymphadenopathy of head and neck   . Anemia 10/31/2016  . Systemic mastocytosis 08/22/2016  . Osteopenia 11/22/2012    Past Surgical History:  Procedure Laterality Date  . cataract surgery  08/06/2014  . cataract surgery  11/05/2014  . MASS BIOPSY Left 02/17/2017   Procedure: NECK MASS BIOPSY;  Surgeon: Margaretha Sheffield, MD;  Location: [place];  Service: ENT;  Laterality: Left;  NEED HARMONIC SCAPLE diabetic - insulin and oral meds sleep apnea  .  PORTA CATH INSERTION N/A 02/22/2017   Procedure: Glori Luis Cath Insertion;  Surgeon: Algernon Huxley, MD;  Location: [place] [facility];  Service: Cardiovascular;  Laterality: N/A;  . SEPTOPLASTY  12/15/2011  . Spinal implants  10/06/2011  . TYMPANOSTOMY TUBE PLACEMENT Right 11/03/2011  . X-STOP IMPLANTATION Right 07/25/2013    Prior to Admission medications   Medication Sig Start Date End Date Taking? Authorizing Provider  amitriptyline (ELAVIL) 25 MG tablet Take 25 mg by mouth daily. 10/07/16 10/07/17  [provider]  amLODipine (NORVASC) 5 MG tablet Take 5 mg by mouth daily. 11/23/16 11/23/17  [provider]  aspirin EC 81 MG tablet Take 81 mg by mouth 2 (two) times daily.    [provider]  atorvastatin (LIPITOR) 20 MG tablet Take 20 mg by mouth daily.     [provider]  Calcium Citrate 200 MG TABS Take 1,000 mg by mouth 2 (two) times daily.     [provider]  Cetirizine HCl 10 MG CAPS Take 10 mg by mouth 2 (two) times daily.     [provider]  cimetidine (TAGAMET) 400 MG tablet Take 400 mg by mouth 2 (two) times daily.     [provider]  clotrimazole (MYCELEX) 10 MG troche Take 1 lozenge (10 mg total) by mouth 5 (five) times daily. 06/10/17   Lequita Asal, MD  cromolyn (GASTROCROM) 100 MG/5ML solution Take 200 mg by mouth 4 (four) times daily -  before meals and at bedtime.  [provider]  diphenhydrAMINE (BENADRYL) 50 MG capsule Take 50 mg by mouth 2 (two) times daily.     [provider]  doxepin (SINEQUAN) 10 MG/ML solution Take 2.5 ml [25mg ] in 5 ml of water; Oral rinse for 1 minute; and spit out once a day. 04/08/17   Cammie Sickle, MD  EPINEPHrine 0.3 mg/0.3 mL IJ SOAJ injection Inject 0.3 mg into the muscle once.     [provider]  fluticasone (FLONASE) 50 MCG/ACT nasal spray Place 2 sprays into both nostrils daily.     [provider]  gabapentin (NEURONTIN) 600 MG  tablet Take 1,200 mg by mouth 3 (three) times daily.     [provider]  GLEEVEC 400 MG tablet Take 1 tablet (400 mg total) by mouth daily. Take with meals and large glass of water.Caution:Chemotherapy. 04/01/17   Sindy Guadeloupe, MD  glimepiride (AMARYL) 4 MG tablet Take 4 mg by mouth 2 (two) times daily.     [provider]  glucagon (GLUCAGON EMERGENCY) 1 MG injection Inject 1 mg into the muscle once as needed.     [provider]  Insulin Glargine (LANTUS [place]) Inject 100 Units into the skin daily.     [provider]  insulin regular (NOVOLIN R,HUMULIN R) 100 units/mL injection Inject 90 Units into the skin 3 (three) times daily as needed.     [provider]  Insulin Syringe-Needle U-100 (B-D INS SYR ULTRAFINE .3CC/30G) 30G X 1/2" 0.3 ML MISC Use 1 Syringe as directed.    [provider]  Lancets 28G MISC Use 1 each 3 (three) times daily. Use as instructed. 07/28/16 07/28/17  [provider]  lidocaine-prilocaine (EMLA) cream Apply 1 application topically as needed. 03/02/17   Lequita Asal, MD  LORazepam (ATIVAN) 0.5 MG tablet Take 1 tablet (0.5 mg total) by mouth every 6 (six) hours as needed (Nausea or vomiting). 04/22/17   Lequita Asal, MD  losartan (COZAAR) 100 MG tablet Take 100 mg by mouth daily.     [provider]  metFORMIN (GLUCOPHAGE) 1000 MG tablet Take 2,000 mg by mouth daily with breakfast.     [provider]  Multiple Vitamin (MULTIVITAMIN) tablet Take 1 tablet by mouth daily.    [provider]  omeprazole (PRILOSEC) 20 MG capsule Take 20 mg by mouth 2 (two) times daily before a meal.     [provider]  oxyCODONE-acetaminophen (PERCOCET) 10-325 MG tablet Take 1 tablet by mouth every 4 (four) hours as needed for pain. 05/12/17   Lequita Asal, MD  phenazopyridine (PYRIDIUM) 200 MG tablet Take 200 mg by mouth 2 (two) times daily.     [provider]  tamsulosin  (FLOMAX) 0.4 MG CAPS capsule Take 0.4 mg by mouth daily.     [provider]  tiZANidine (ZANAFLEX) 4 MG capsule Take 4 mg by mouth daily.     [provider]  traMADol (ULTRAM) 50 MG tablet Take 50 mg by mouth 3 (three) times daily.  08/02/16   [provider]  zolpidem (AMBIEN CR) 12.5 MG CR tablet Take 12.5 mg by mouth at bedtime.     [provider]    No Known Allergies  Family History  Problem Relation Age of Onset  . Diabetes Father   . Hypertension Father   . Diabetes Paternal Grandmother     Social History Social History  Substance Use Topics  . Smoking status: Former Smoker  Quit date: 11/22/2014  . Smokeless tobacco: Never Used     Comment: Patient smoked 24 years - 2 pks a day  . Alcohol use No    Review of Systems Constitutional: Negative for fever ENT: Positive for difficulty swallowing due to mass and throat per patient Cardiovascular: Negative for chest pain. Respiratory: Shortness of breath due to the mass in the throat, states no shortness of breath in his chest/lungs. Gastrointestinal: Negative for abdominal pain, vomiting Neurological: Negative for headache All other ROS negative  ____________________________________________   PHYSICAL EXAM:  VITAL SIGNS: ED Triage Vitals  Enc Vitals Group     BP 06/27/17 0958 139/73     Pulse Rate 06/27/17 0958 93     Resp 06/27/17 0958 20     Temp 06/27/17 0958 98.7 F (37.1 C)     Temp Source 06/27/17 0958 Oral     SpO2 06/27/17 0958 96 %     Weight 06/27/17 0958 (!) 344 lb (156 kg)     Height 06/27/17 0958 6\' 3"  (1.905 m)     Head Circumference --      Peak Flow --      Pain Score 06/27/17 0957 6     Pain Loc --      Pain Edu? --      Excl. in [place]? --     Constitutional: Alert and oriented. Well appearing and in no distress. Eyes: Normal exam ENT   Head: Normocephalic and atraumatic.   Mouth/Throat: Mucous membranes are moist.No abnormalities identified in  the visible oral pharynx.  Cardiovascular: Normal rate, regular rhythm. No murmur Respiratory: Normal respiratory effort without tachypnea nor retractions. Breath sounds are clear Gastrointestinal: Soft and nontender. No distention.  Musculoskeletal: Nontender with normal range of motion in all extremities. Neurologic:  Normal speech and language. No gross focal neurologic deficits Skin:  Skin is warm, dry and intact.  Psychiatric: Mood and affect are normal.   ____________________________________________   RADIOLOGY  IMPRESSION: Marked improvement in the pathologic adenopathy in the neck since the prior CT. No new or enlarging lymph nodes identified. Base of tongue mass not visualized.  ____________________________________________   INITIAL IMPRESSION / ASSESSMENT AND PLAN / ED COURSE  Pertinent labs & imaging results that were available during my care of the patient were reviewed by me and considered in my medical decision making (see chart for details).  Patient presents for difficulty swallowing/breathing due to a throat mass. Patient is currently being treated by oncology, he states he is due for a PET scan next month to decide upon the next step in treatment. Patient appears very well currently in the emergency department, no distress, no difficulty breathing. He states the difficulty swallowing and breathing is very positional (if he looks up or turns his head it is much easier to breathe and swallow. We will obtain a CT scan of the neck to evaluate the mass. We will continue to closely monitor in the emergency department.  I discussed the patient with Dr. Pryor Ochoa of ENT. He came to the emergency department and perform a bedside scope. He states there is some soft tissue edema, but the airway is patent. He states as the patient recently had teeth pulled given the edema he would prefer the patient be covered with Diflucan, Augmentin, and Decadron area and he will have the patient  follow-up in the office later this week for recheck/reevaluation. I discussed this with the patient who is agreeable to this plan.  ____________________________________________  FINAL CLINICAL IMPRESSION(S) / ED DIAGNOSES  Pharyngeal edema    Harvest Dark, MD 06/27/17 1343

## 2017-06-27 NOTE — ED Notes (Signed)
First nurse note: Pt ambulatory to desk. No apparent distress noted. Pt speaking in complete sentences without difficulty.

## 2017-06-27 NOTE — ED Triage Notes (Signed)
Pt reports cancerous mass In his throat that he feels like is making his throat close up. Pt in NAD at this time. Pt reports difficulty swallowing also.

## 2017-07-01 ENCOUNTER — Ambulatory Visit
Admission: RE | Admit: 2017-07-01 | Discharge: 2017-07-01 | Disposition: A | Discharge status: Home / Self Care | Payer: BLUE CROSS/BLUE SHIELD | Source: Ambulatory Visit | Attending: Radiation Oncology | Admitting: Radiation Oncology

## 2017-07-01 ENCOUNTER — Other Ambulatory Visit: Payer: Self-pay | Admitting: *Deleted

## 2017-07-01 ENCOUNTER — Encounter: Payer: Self-pay | Admitting: Radiation Oncology

## 2017-07-01 ENCOUNTER — Inpatient Hospital Stay: Payer: BLUE CROSS/BLUE SHIELD | Attending: Hematology and Oncology

## 2017-07-01 ENCOUNTER — Other Ambulatory Visit: Payer: Self-pay | Admitting: Hematology and Oncology

## 2017-07-01 ENCOUNTER — Inpatient Hospital Stay: Payer: BLUE CROSS/BLUE SHIELD

## 2017-07-01 VITALS — BP 129/79 | HR 80 | Temp 94.8°F | Resp 20 | Wt 338.8 lb

## 2017-07-01 DIAGNOSIS — Z923 Personal history of irradiation: Secondary | ICD-10-CM | POA: Diagnosis not present

## 2017-07-01 DIAGNOSIS — R1310 Dysphagia, unspecified: Secondary | ICD-10-CM | POA: Insufficient documentation

## 2017-07-01 DIAGNOSIS — C01 Malignant neoplasm of base of tongue: Secondary | ICD-10-CM

## 2017-07-01 DIAGNOSIS — Z9221 Personal history of antineoplastic chemotherapy: Secondary | ICD-10-CM | POA: Insufficient documentation

## 2017-07-01 DIAGNOSIS — E538 Deficiency of other specified B group vitamins: Secondary | ICD-10-CM

## 2017-07-01 DIAGNOSIS — C119 Malignant neoplasm of nasopharynx, unspecified: Secondary | ICD-10-CM | POA: Diagnosis not present

## 2017-07-01 DIAGNOSIS — D4702 Systemic mastocytosis: Secondary | ICD-10-CM | POA: Diagnosis not present

## 2017-07-01 DIAGNOSIS — B370 Candidal stomatitis: Secondary | ICD-10-CM | POA: Diagnosis not present

## 2017-07-01 DIAGNOSIS — Z79899 Other long term (current) drug therapy: Secondary | ICD-10-CM | POA: Diagnosis not present

## 2017-07-01 MED ORDER — CYANOCOBALAMIN 1000 MCG/ML IJ SOLN
1000.0000 ug | Freq: Once | INTRAMUSCULAR | Status: AC
  Administered 2017-07-01: 1000 ug via INTRAMUSCULAR
  Filled 2017-07-01: qty 1

## 2017-07-01 MED ORDER — FLUCONAZOLE 200 MG PO TABS: 200.0000 mg | ORAL_TABLET | Freq: Every day | ORAL | 1 refills | Status: AC

## 2017-07-01 NOTE — Progress Notes (Signed)
Radiation Oncology Follow up Note  Name: Dwayne Richard   Date:   07/01/2017 MRN:  [id number] DOB: 1971-06-12    This 46 y.o. male presents to the clinic today for one-month follow-up status post concurrent chemoradiation for locally advanced nasopharyngeal carcinoma.  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 He's had some problems with dysphasia over the past several months and chronic oral candidiasis. He also recently had his teeth extracted.he was recently seen in the emergency room from increasing dysphagia and difficulty breathing CT scan showed improvement in the adenopathy of the neck with no evidence of disease noted.he also had by ENT and upper endoscopy performed showing soft tissue edema but patent airway. He is seen today and doing better. Still states he is having some slight dysphasia his weight has remained stable. He is on fairly soft food diet.  COMPLICATIONS OF TREATMENT: present  FOLLOW UP COMPLIANCE: keeps appointments   PHYSICAL EXAM:  BP 129/79   Pulse 80   Temp (!) 94.8 F (34.9 C)   Resp 20   Wt (!) 338 lb 13.6 oz (153.7 kg)   BMI 42.35 kg/m  Oral cavity is clear still has some moderate oral mucositis in the posterior oropharynx. Upper airway appears clear vallecula and base of tongue within normal limits. Neck is clear without evidence of subject gastric cervical or supraclavicular adenopathy. Well-developed well-nourished patient in NAD. HEENT reveals PERLA, EOMI, discs not visualized.  Oral cavity is clear. No oral mucosal lesions are identified. Neck is clear without evidence of cervical or supraclavicular adenopathy. Lungs are clear to A&P. Cardiac examination is essentially unremarkable with regular rate and rhythm without  murmur rub or thrill. Abdomen is benign with no organomegaly or masses noted. Motor sensory and DTR levels are equal and symmetric in the upper and lower extremities. Cranial nerves II through XII are grossly intact. Proprioception is intact. No peripheral adenopathy or edema is identified. No motor or sensory levels are noted. Crude visual fields are within normal range.  RADIOLOGY RESULTS: CT scan is reviewed and shows marked improvement in cervical adenopathy.  PLAN: present time patient is slowly recovering from his concurrent treatment. CT scan shows excellent results. I have asked to see him back in 3-4 months for follow-up will anticipate seeing a PET CT by that time. Patient will continue on soft diet I've started him on Diflucan for 10 day course which should help with the oral candidiasis. Patient continues close follow-up care with ENT as well as medical oncology. He knows to call with any concerns.  I would like to take this opportunity to thank you for allowing me to participate in the care of your patient.Armstead Peaks., MD

## 2017-07-08 ENCOUNTER — Inpatient Hospital Stay: Payer: BLUE CROSS/BLUE SHIELD

## 2017-07-10 ENCOUNTER — Encounter: Payer: Self-pay | Admitting: Hematology and Oncology

## 2017-07-11 ENCOUNTER — Other Ambulatory Visit: Payer: Self-pay | Admitting: *Deleted

## 2017-07-11 DIAGNOSIS — C01 Malignant neoplasm of base of tongue: Secondary | ICD-10-CM

## 2017-07-11 DIAGNOSIS — Z5111 Encounter for antineoplastic chemotherapy: Secondary | ICD-10-CM

## 2017-07-11 MED ORDER — GLEEVEC 400 MG PO TABS: 400.0000 mg | ORAL_TABLET | Freq: Every day | ORAL | 0 refills | Status: DC

## 2017-07-21 ENCOUNTER — Other Ambulatory Visit: Payer: Self-pay | Admitting: Hematology and Oncology

## 2017-07-21 ENCOUNTER — Telehealth: Payer: Self-pay | Admitting: *Deleted

## 2017-07-21 DIAGNOSIS — C01 Malignant neoplasm of base of tongue: Secondary | ICD-10-CM

## 2017-07-21 NOTE — Telephone Encounter (Signed)
Returned patients call about prescriptions for pain medication.  Patient reports hurting in his throat with swallowing and taking the medication prior to meals.  Discussed with Dr. Mike Gip and Dr. Delaine Lame office called, they can see him tomorrow at 1335, patient called and informed of appt for evaluation and that the narcotics would be on hold at this time until the evaluation because we needed to get to the source of the pain, voiced understanding.

## 2017-08-02 ENCOUNTER — Encounter
Admission: RE | Admit: 2017-08-02 | Discharge: 2017-08-02 | Disposition: A | Discharge status: Home / Self Care | Payer: BLUE CROSS/BLUE SHIELD | Source: Ambulatory Visit | Attending: Hematology and Oncology | Admitting: Hematology and Oncology

## 2017-08-02 DIAGNOSIS — C01 Malignant neoplasm of base of tongue: Secondary | ICD-10-CM | POA: Diagnosis not present

## 2017-08-02 DIAGNOSIS — K08409 Partial loss of teeth, unspecified cause, unspecified class: Secondary | ICD-10-CM

## 2017-08-02 HISTORY — DX: Partial loss of teeth, unspecified cause, unspecified class: K08.409

## 2017-08-02 LAB — GLUCOSE, CAPILLARY: Glucose-Capillary: 68 mg/dL (ref 65–99)

## 2017-08-02 MED ORDER — FLUDEOXYGLUCOSE F - 18 (FDG) INJECTION
12.0000 | Freq: Once | INTRAVENOUS | Status: AC | PRN
  Administered 2017-08-02: 12.95 via INTRAVENOUS

## 2017-08-04 ENCOUNTER — Ambulatory Visit: Payer: BLUE CROSS/BLUE SHIELD

## 2017-08-04 ENCOUNTER — Other Ambulatory Visit: Payer: Self-pay | Admitting: *Deleted

## 2017-08-04 DIAGNOSIS — C029 Malignant neoplasm of tongue, unspecified: Secondary | ICD-10-CM

## 2017-08-04 NOTE — Progress Notes (Signed)
[facility] day:  08/05/2017   Chief Complaint: Dwayne Richard is a 46 y.o. 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  HPI:  The patient was last seen in the medical oncology clinic by me on 06/10/2017.  At that time, he had trouble swallowing.  He had xerostomia and tongue thrush.  Exam revealed a slight tender left neck.  Hematocrit wasimproving.  Magnesium was normal on supplementation.  Rx was provided for Salivamax and clotrimazole troches.  He was seen in the [facility] ER on 06/27/2017 with shortness of breath and difficulty swallowing.  Soft tissue neck CT on 06/27/2017 revealed marked improvement in pathologic adenopathy in the neck with no new or enlarged lymph nodes.  He was seen by Dr. Kathyrn Sheriff in the ER.  Bedside scope revealed some soft tissue edema with a patent airway.  He was prescribed fluconzaole, Augmentin, and Decadron.  He was scheduled for follow-up within the week.  PET scan on 08/02/2017 revealed no residual hypermetabolic lymph nodes in the neck.  There was nonspecific residual hypermetabolism at the left base of tongue, significantly decreased, favor post treatment change given resolved soft tissue fullness at the left base of tongue on the CT images, although residual tumor was not entirely excluded.  There was no hypermetabolic metastatic disease in the chest, abdomen, pelvis or skeleton.  Additional findings include aortic atherosclerosis,emphysema, diffuse hepatic steatosis and mild left colonic diverticulosis.  Symptomatically, he is doing well.  He has continued soreness in his mouth. He has had thrush in the past. He was treated with Fluconazole x 2 courses and multiple rounds of Nystatin mouthwash.  He has currently finished both. Patient has had all of his teeth extracted.  He denies any bleeding, hematochezia or melena.   He has lost 24 pounds  since last visit.  He is moving at the end of the month to [place].    Past Medical History:  Diagnosis Date  . Cancer (HCC)    Squamous cell carcinoma of base of tongue. All teeth extracted Jul 2018  . COPD (chronic obstructive pulmonary disease) ([place])   . Depression   . Diabetes mellitus without complication ([place])   . GERD (gastroesophageal reflux disease)   . H/O tooth extraction 08/02/2017   All teeth extracted Jul 2018 after damage from Rad Tx  . Hypertension   . Neuropathy    from knees down, "tingling" in hands  . S/P insertion of spinal cord stimulator    (x2)  . Sleep apnea   . Systemic mastocytosis    Per pt    Past Surgical History:  Procedure Laterality Date  . cataract surgery  08/06/2014  . cataract surgery  11/05/2014  . MASS BIOPSY Left 02/17/2017   Procedure: NECK MASS BIOPSY;  Surgeon: Margaretha Sheffield, MD;  Location: [facility];  Service: ENT;  Laterality: Left;  NEED HARMONIC SCAPLE diabetic - insulin and oral meds sleep apnea  . PORTA CATH INSERTION N/A 02/22/2017   Procedure: Glori Luis Cath Insertion;  Surgeon: Algernon Huxley, MD;  Location: [facility];  Service: Cardiovascular;  Laterality: N/A;  . SEPTOPLASTY  12/15/2011  . Spinal implants  10/06/2011  . TYMPANOSTOMY TUBE PLACEMENT Right 11/03/2011  . X-STOP IMPLANTATION Right 07/25/2013    Family History  Problem Relation Age of Onset  . Diabetes Father   . Hypertension Father   .  Diabetes Paternal Grandmother     Social History:  reports that he quit smoking about 2 years ago. He has never used smokeless tobacco. He reports that he does not drink alcohol or use drugs.  He and his wife moved from [place].  He lives in [place].  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  The patient is accompanied by his wife, Dwayne Richard.  Allergies: No Known Allergies  Current Medications: Current Outpatient Prescriptions  Medication Sig Dispense Refill  . amitriptyline (ELAVIL) 25 MG tablet Take 25 mg by mouth daily.     Marland Kitchen amLODipine (NORVASC) 5 MG tablet Take 5 mg by mouth daily.    Marland Kitchen aspirin EC 81 MG tablet Take 81 mg by mouth 2 (two) times daily.    Marland Kitchen atorvastatin (LIPITOR) 20 MG tablet Take 20 mg by mouth daily.     . Calcium Citrate 200 MG TABS Take 1,000 mg by mouth 2 (two) times daily.     . Cetirizine HCl 10 MG CAPS Take 10 mg by mouth 2 (two) times daily.     . cimetidine (TAGAMET) 400 MG tablet Take 400 mg by mouth 2 (two) times daily.     . clotrimazole (MYCELEX) 10 MG troche Take 1 lozenge (10 mg total) by mouth 5 (five) times daily. 35 lozenge 1  . diphenhydrAMINE (BENADRYL) 50 MG capsule Take 50 mg by mouth 2 (two) times daily.     Marland Kitchen doxepin (SINEQUAN) 10 MG/ML solution Take 2.5 ml [63m] in 5 ml of water; Oral rinse for 1 minute; and spit out once a day. 120 mL 1  . EPINEPHrine 0.3 mg/0.3 mL IJ SOAJ injection Inject 0.3 mg into the muscle once.     . fluconazole (DIFLUCAN) 200 MG tablet Take 1 tablet (200 mg total) by mouth daily. 10 tablet 1  . fluticasone (FLONASE) 50 MCG/ACT nasal spray Place 2 sprays into both nostrils daily.     .Marland Kitchengabapentin (NEURONTIN) 600 MG tablet Take 1,200 mg by mouth 3 (three) times daily.     .Marland KitchenGLEEVEC 400 MG tablet Take 1 tablet (400 mg total) by mouth daily. Take with meals and large glass of water.Caution:Chemotherapy. 90 tablet 0  . glimepiride (AMARYL) 4 MG tablet Take 4 mg by mouth 2 (two) times daily.     .Marland Kitchenglucagon (GLUCAGON EMERGENCY) 1 MG injection Inject 1 mg into the muscle once as needed.     . Insulin Glargine (LANTUS [place]) Inject 100 Units into the skin daily.     . insulin regular (NOVOLIN R,HUMULIN R) 100 units/mL injection Inject 90 Units into the skin 3 (three) times daily as needed.     . Insulin Syringe-Needle U-100 (B-D INS SYR ULTRAFINE .3CC/30G) 30G X 1/2" 0.3 ML MISC Use 1 Syringe as directed.    . lidocaine-prilocaine (EMLA) cream Apply 1 application topically as needed. 30 g 0  . losartan (COZAAR) 100 MG tablet Take 100 mg by mouth daily.      . metFORMIN (GLUCOPHAGE) 1000 MG tablet Take 2,000 mg by mouth daily with breakfast.     . Multiple Vitamin (MULTIVITAMIN) tablet Take 1 tablet by mouth daily.    .Marland Kitchenomeprazole (PRILOSEC) 20 MG capsule Take 20 mg by mouth 2 (two) times daily before a meal.     . phenazopyridine (PYRIDIUM) 200 MG tablet Take 200 mg by mouth 2 (two) times daily.     . sildenafil (REVATIO) 20 MG tablet 1-5 po prn    . tamsulosin (FLOMAX) 0.4  MG CAPS capsule Take 0.4 mg by mouth daily.     Marland Kitchen tiZANidine (ZANAFLEX) 4 MG capsule Take 4 mg by mouth daily.     . traMADol (ULTRAM) 50 MG tablet Take 50 mg by mouth 3 (three) times daily.     Marland Kitchen zolpidem (AMBIEN CR) 12.5 MG CR tablet Take 12.5 mg by mouth at bedtime.     Marland Kitchen LORazepam (ATIVAN) 0.5 MG tablet Take 1 tablet (0.5 mg total) by mouth every 6 (six) hours as needed (Nausea or vomiting). (Patient not taking: Reported on 07/01/2017) 30 tablet 0  . oxyCODONE-acetaminophen (PERCOCET) 10-325 MG tablet Take 1 tablet by mouth every 4 (four) hours as needed for pain. (Patient not taking: Reported on 06/27/2017) 30 tablet 0   No current facility-administered medications for this visit.    Facility-Administered Medications Ordered in Other Visits  Medication Dose Route Frequency Provider Last Rate Last Dose  . sodium chloride flush (NS) 0.9 % injection 10 mL  10 mL Intravenous PRN Lequita Asal, MD   10 mL at 08/05/17 1791    Review of Systems:  GENERAL:  Feels "ok".  No fevers or sweats.  Weight loss of 24  pounds in 2 months. PERFORMANCE STATUS (ECOG):  1 HEENT:  Throat tender.  Intermittent tinnitus.  Complete dental extraction (see HPI).  No visual changes, runny nose, mouth sores or tenderness. Lungs: No shortness of breath.  No cough.  No hemoptysis.  Sleep apnea. Cardiac:  No chest pain, palpitations, orthopnea, or PND. GI:  No nausea, vomiting, diarrhea, melena or hematochezia.  GU:  No urgency, frequency, dysuria, or hematuria. On Flomax. Musculoskeletal:  No  back pain.  No joint pain.  No muscle tenderness. Extremities:  No pain or swelling. Skin:  No severe flares or urticaria. Neuro:  No headache, numbness or weakness, balance or coordination issues. Endocrine:  Diabetes.  No tthyroid issues, hot flashes or night sweats. Psych:  No mood changes, depression or anxiety.  Poor sleep. Pain:  Left neck sore. Review of systems:  All other systems reviewed and found   Physical Exam: Blood pressure 116/78, pulse (!) 103, temperature 97.7 F (36.5 C), temperature source Tympanic, resp. rate 20, weight (!) 334 lb 6 oz (151.7 kg). GENERAL:  Well developed, well nourished, overweight gentleman sitting comfortably in the exam room in no acute distress. MENTAL STATUS:  Alert and oriented to person, place and time. HEAD:  Short dark crewcut.  Normocephalic, atraumatic, face symmetric, no Cushingoid features. EYES:  Glasses.  Pupils equal round and reactive to light and accomodation.  No conjunctivitis or scleral icterus. ENT:  Oropharynx clear with resolution of mucositis or thick secretions.   Tongue thrush.  s/p extractions.  Mucous membranes slightly dry.  NECK:  Supple with tender left neck.  Mild scarring s/p biopsy.  No discrete adenopathy.  No erythema or induration. RESPIRATORY:  Clear to auscultation without rales, wheezes or rhonchi. CARDIOVASCULAR:  Regular rate and rhythm without murmur, rub or gallop. ABDOMEN:  Soft, non-tender, with active bowel sounds, and no appreciable hepatosplenomegaly.  No masses. SKIN:  No rashes or skin changes.  EXTREMITIES: No edema, no skin discoloration or tenderness.  No palpable cords. LYMPH NODES:  No palpable cervical, supraclavicular, axillary or inguinal adenopathy  NEUROLOGICAL: Unremarkable. PSYCH:  Appropriate.   Imaging studies: 02/13/2017:  Neck CT revealed bulky (left >right) cervical lymphadenopathy. The largest nodes wereat the left level II station measuring up to 4.4 cm long axis (2.5-3 cm  short axis). There  were smaller but abnormally rounded lymph nodes throughout the neck elsewhere. There were nocystic or necrotic nodes. There was superimposed nonspecific appearing inflammatory process at the right level 1B nodal station, with associated overlying cellulitis. There was no associated fluid collection. 02/14/2017:  Chest CT revealed scattered upper normal to mildly enlarged lymph nodes in the mediastinum and upper abdomen come nonspecific for reactive versus neoplastic etiology.  An index right hilar lymph node measures 1.1 cm in short axis. 02/28/2017:  PET scan revealed focal hypermetabolism in the left posterior oropharynx centered at the left base of tongue with associated asymmetric soft tissue fullness on the CT images, compatible with primary oropharyngeal carcinoma. There was hypermetabolic left level II cervical nodal metastases. There was no hypermetabolic contralateral cervical nodal metastases or distant metastatic disease. Clinical stageis T2N1M0. 06/27/2017:  Soft tissue neck CT revealed marked improvement in pathologic adenopathy in the neck  With no new or enlarged lymph nodes.  He was seen by Dr. Kathyrn Sheriff in the ER.  Bedside scope revealed some soft tissue edema with a patent airway.  He was prescribed fluconzaole, Augmentin, and Decadron.  He was scheduled for follow-up within the week. 08/02/2017:  PET scan revealed no residual hypermetabolic lymph nodes in the neck.  There was nonspecific residual hypermetabolism at the left base of tongue, significantly decreased, favor post treatment change given resolved soft tissue fullness at the left base of tongue on the CT images, although residual tumor was not entirely excluded.  There was no hypermetabolic metastatic disease in the chest, abdomen, pelvis or skeleton.  Additional findings include aortic atherosclerosis,emphysema, diffuse hepatic steatosis and mild left colonic diverticulosis.   Infusion on 08/05/2017   Component Date Value Ref Range Status  . Sodium 08/05/2017 139  135 - 145 mmol/L Final  . Potassium 08/05/2017 4.1  3.5 - 5.1 mmol/L Final  . Chloride 08/05/2017 104  101 - 111 mmol/L Final  . CO2 08/05/2017 25  22 - 32 mmol/L Final  . Glucose, Bld 08/05/2017 141* 65 - 99 mg/dL Final  . BUN 08/05/2017 15  6 - 20 mg/dL Final  . Creatinine, Ser 08/05/2017 1.03  0.61 - 1.24 mg/dL Final  . Calcium 08/05/2017 9.3  8.9 - 10.3 mg/dL Final  . Total Protein 08/05/2017 7.0  6.5 - 8.1 g/dL Final  . Albumin 08/05/2017 4.3  3.5 - 5.0 g/dL Final  . AST 08/05/2017 32  15 - 41 U/L Final  . ALT 08/05/2017 44  17 - 63 U/L Final  . Alkaline Phosphatase 08/05/2017 72  38 - 126 U/L Final  . Total Bilirubin 08/05/2017 0.9  0.3 - 1.2 mg/dL Final  . GFR calc non Af Amer 08/05/2017 >60  >60 mL/min Final  . GFR calc Af Amer 08/05/2017 >60  >60 mL/min Final   Comment: (NOTE) The eGFR has been calculated using the CKD EPI equation. This calculation has not been validated in all clinical situations. eGFR's persistently <60 mL/min signify possible Chronic Richard Disease.   . Anion gap 08/05/2017 10  5 - 15 Final  . Magnesium 08/05/2017 1.8  1.7 - 2.4 mg/dL Final  . WBC 08/05/2017 6.9  3.8 - 10.6 K/uL Final  . RBC 08/05/2017 3.14* 4.40 - 5.90 MIL/uL Final  . Hemoglobin 08/05/2017 9.9* 13.0 - 18.0 g/dL Final  . HCT 08/05/2017 30.1* 40.0 - 52.0 % Final  . MCV 08/05/2017 95.7  80.0 - 100.0 fL Final  . MCH 08/05/2017 31.4  26.0 - 34.0 pg Final  . MCHC 08/05/2017  32.9  32.0 - 36.0 g/dL Final  . RDW 08/05/2017 17.5* 11.5 - 14.5 % Final  . Platelets 08/05/2017 212  150 - 440 K/uL Final  . Neutrophils Relative % 08/05/2017 65  % Final  . Neutro Abs 08/05/2017 4.5  1.4 - 6.5 K/uL Final  . Lymphocytes Relative 08/05/2017 25  % Final  . Lymphs Abs 08/05/2017 1.7  1.0 - 3.6 K/uL Final  . Monocytes Relative 08/05/2017 9  % Final  . Monocytes Absolute 08/05/2017 0.6  0.2 - 1.0 K/uL Final  . Eosinophils Relative  08/05/2017 1  % Final  . Eosinophils Absolute 08/05/2017 0.1  0 - 0.7 K/uL Final  . Basophils Relative 08/05/2017 0  % Final  . Basophils Absolute 08/05/2017 0.0  0 - 0.1 K/uL Final    Assessment:  Dwayne Richard is a 46 y.o. 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  Excisional left neck node biopsy on 02/17/2017 revealed metastatic squamous cell carcinoma, P16 positive.  HPV in situ hybridization was positive (16/18).  EBV ISH was negative.  Galactose alpha 1,3 was negative.  PET scan on 04/09/2018revealed focal hypermetabolism in the left posterior oropharynx centered at the left base of tongue with associated asymmetric soft tissue fullness on the CT images, compatible with primary oropharyngeal carcinoma. There was hypermetabolic left level II cervical nodal metastases. There was no hypermetabolic contralateral cervical nodal metastases or distant metastatic disease. Clinical stageis T2N1M0.  He received radiation (03/08/2017 - 05/04/2017).  He received weekly cisplatin (40 mg/m2) from 03/11/2017 - 04/29/2017.  PET scan on 08/02/2017 revealed no residual hypermetabolic lymph nodes in the neck.  There was nonspecific residual hypermetabolism at the left base of tongue, significantly decreased, favor post treatment change given resolved soft tissue fullness at the left base of tongue on the CT images.  There was no hypermetabolic metastatic disease in the chest, abdomen, pelvis or skeleton.    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 Tryptase was 32.3 (2.2-13.2) on 07/29/2016. He is on several medications: cetirizine (Zyrtec), cimetidine (Tagamet), cromolyn (Gastrocrom), and Gleevec (400 mg a day). He uses an epinephrine pen 3-4 times a year.  He was on a trial ofinterferonin early 2016 x 36month. Interferon was discontinued in 07/2015. Interferon did not improve his symptoms.  He experienceduncontrolled diabetes and elevated liver function tests with interferon.  [facility] [place]. There were small poorly defined areas of apparent spindle cells which did not stained positive for mastcells. CK mutation was negative. There were rare atypical cells expressing CD-117 and CD-25. The cells represented approximately 0.1% of the leukocytes.   [facility] [place] of persistent systemic mastocytosis (5-10% of cellularity). There was no increase in blast or overt dyspoiesis. There was moderate reticulin fibrosis. Storage iron was present. There were no ring sideroblasts. Flow cytometry revealed no phenotypic evidence of lymphoma or increased blasts. Cytogenetics were normal (46, XY). There was no evidence of KIT(D816V) point mutation by PCR.   He has osteopeniaon bone density study in 2014. He is on calcium and vitamin D.  He was diagnosed with B12 deficiency.  B12 was 186 on 04/22/2017.  He began weekly B12 on 04/29/2017 (last 07/01/2017).  Symptomatically, he has continued throat soreness.  He has had a significant weight loss (24 pounds) since teeth extraction.  Exam  reveals a slight tender left neck. Hematocrit has decreased to 30.1.    Plan: 1.  Labs today:  CBC with diff, CMP, Mg, parietal antibodies, intrinsic factor antibodies, TSH, ferritin, retic. 2.  Review interval PET scan- no evidence of metastatic disease. 3.  B12 today. 4.  Continue B12 monthly. 5.  Patient to request records (medical records: clinic notes, radiation summary, imaging studies on CD) to take with him when he moves to [place].  He is moving at the end of the month.  6.  Contact patient with abnormal lab results. 7.  Refill Gleevec. 8.  RTC prn.   Honor Loh, NP  08/05/2017, 9:55 AM   I saw and evaluated  the patient, participating in the key portions of the service and reviewing pertinent diagnostic studies and records.  I reviewed the nurse practitioner's note and agree with the findings and the plan.  The assessment and plan were discussed with the patient.  Multiple questions were asked by the patient and answered.   Lequita Asal, MD 08/05/2017,3:54 PM

## 2017-08-05 ENCOUNTER — Encounter: Payer: Self-pay | Admitting: Hematology and Oncology

## 2017-08-05 ENCOUNTER — Inpatient Hospital Stay: Payer: BLUE CROSS/BLUE SHIELD

## 2017-08-05 ENCOUNTER — Other Ambulatory Visit: Payer: Self-pay

## 2017-08-05 ENCOUNTER — Inpatient Hospital Stay: Payer: BLUE CROSS/BLUE SHIELD | Admitting: Hematology and Oncology

## 2017-08-05 ENCOUNTER — Inpatient Hospital Stay: Payer: BLUE CROSS/BLUE SHIELD | Attending: Hematology and Oncology

## 2017-08-05 ENCOUNTER — Inpatient Hospital Stay (HOSPITAL_BASED_OUTPATIENT_CLINIC_OR_DEPARTMENT_OTHER): Payer: BLUE CROSS/BLUE SHIELD | Admitting: Hematology and Oncology

## 2017-08-05 VITALS — BP 116/78 | HR 103 | Temp 97.7°F | Resp 20 | Wt 334.4 lb

## 2017-08-05 DIAGNOSIS — G4730 Sleep apnea, unspecified: Secondary | ICD-10-CM

## 2017-08-05 DIAGNOSIS — Z7982 Long term (current) use of aspirin: Secondary | ICD-10-CM | POA: Insufficient documentation

## 2017-08-05 DIAGNOSIS — K5790 Diverticulosis of intestine, part unspecified, without perforation or abscess without bleeding: Secondary | ICD-10-CM | POA: Diagnosis not present

## 2017-08-05 DIAGNOSIS — I700 Atherosclerosis of aorta: Secondary | ICD-10-CM | POA: Insufficient documentation

## 2017-08-05 DIAGNOSIS — K760 Fatty (change of) liver, not elsewhere classified: Secondary | ICD-10-CM | POA: Diagnosis not present

## 2017-08-05 DIAGNOSIS — K219 Gastro-esophageal reflux disease without esophagitis: Secondary | ICD-10-CM

## 2017-08-05 DIAGNOSIS — F329 Major depressive disorder, single episode, unspecified: Secondary | ICD-10-CM

## 2017-08-05 DIAGNOSIS — J439 Emphysema, unspecified: Secondary | ICD-10-CM

## 2017-08-05 DIAGNOSIS — Z794 Long term (current) use of insulin: Secondary | ICD-10-CM

## 2017-08-05 DIAGNOSIS — Z87891 Personal history of nicotine dependence: Secondary | ICD-10-CM | POA: Diagnosis not present

## 2017-08-05 DIAGNOSIS — C770 Secondary and unspecified malignant neoplasm of lymph nodes of head, face and neck: Secondary | ICD-10-CM | POA: Diagnosis not present

## 2017-08-05 DIAGNOSIS — G629 Polyneuropathy, unspecified: Secondary | ICD-10-CM | POA: Insufficient documentation

## 2017-08-05 DIAGNOSIS — C01 Malignant neoplasm of base of tongue: Secondary | ICD-10-CM | POA: Insufficient documentation

## 2017-08-05 DIAGNOSIS — R1310 Dysphagia, unspecified: Secondary | ICD-10-CM | POA: Insufficient documentation

## 2017-08-05 DIAGNOSIS — E538 Deficiency of other specified B group vitamins: Secondary | ICD-10-CM | POA: Diagnosis not present

## 2017-08-05 DIAGNOSIS — D4702 Systemic mastocytosis: Secondary | ICD-10-CM

## 2017-08-05 DIAGNOSIS — Z79899 Other long term (current) drug therapy: Secondary | ICD-10-CM

## 2017-08-05 DIAGNOSIS — Z95828 Presence of other vascular implants and grafts: Secondary | ICD-10-CM

## 2017-08-05 DIAGNOSIS — M8580 Other specified disorders of bone density and structure, unspecified site: Secondary | ICD-10-CM | POA: Diagnosis not present

## 2017-08-05 DIAGNOSIS — I10 Essential (primary) hypertension: Secondary | ICD-10-CM

## 2017-08-05 DIAGNOSIS — J449 Chronic obstructive pulmonary disease, unspecified: Secondary | ICD-10-CM | POA: Insufficient documentation

## 2017-08-05 DIAGNOSIS — E119 Type 2 diabetes mellitus without complications: Secondary | ICD-10-CM

## 2017-08-05 DIAGNOSIS — R634 Abnormal weight loss: Secondary | ICD-10-CM | POA: Insufficient documentation

## 2017-08-05 DIAGNOSIS — E611 Iron deficiency: Secondary | ICD-10-CM

## 2017-08-05 DIAGNOSIS — D649 Anemia, unspecified: Secondary | ICD-10-CM

## 2017-08-05 DIAGNOSIS — C029 Malignant neoplasm of tongue, unspecified: Secondary | ICD-10-CM

## 2017-08-05 LAB — CBC WITH DIFFERENTIAL/PLATELET
Basophils Absolute: 0 10*3/uL (ref 0–0.1)
Basophils Relative: 0 %
Eosinophils Absolute: 0.1 10*3/uL (ref 0–0.7)
Eosinophils Relative: 1 %
HCT: 30.1 % — ABNORMAL LOW (ref 40.0–52.0)
Hemoglobin: 9.9 g/dL — ABNORMAL LOW (ref 13.0–18.0)
Lymphocytes Relative: 25 %
Lymphs Abs: 1.7 10*3/uL (ref 1.0–3.6)
MCH: 31.4 pg (ref 26.0–34.0)
MCHC: 32.9 g/dL (ref 32.0–36.0)
MCV: 95.7 fL (ref 80.0–100.0)
Monocytes Absolute: 0.6 10*3/uL (ref 0.2–1.0)
Monocytes Relative: 9 %
Neutro Abs: 4.5 10*3/uL (ref 1.4–6.5)
Neutrophils Relative %: 65 %
Platelets: 212 10*3/uL (ref 150–440)
RBC: 3.14 MIL/uL — ABNORMAL LOW (ref 4.40–5.90)
RDW: 17.5 % — ABNORMAL HIGH (ref 11.5–14.5)
WBC: 6.9 10*3/uL (ref 3.8–10.6)

## 2017-08-05 LAB — COMPREHENSIVE METABOLIC PANEL
ALT: 44 U/L (ref 17–63)
AST: 32 U/L (ref 15–41)
Albumin: 4.3 g/dL (ref 3.5–5.0)
Alkaline Phosphatase: 72 U/L (ref 38–126)
Anion gap: 10 (ref 5–15)
BUN: 15 mg/dL (ref 6–20)
CO2: 25 mmol/L (ref 22–32)
Calcium: 9.3 mg/dL (ref 8.9–10.3)
Chloride: 104 mmol/L (ref 101–111)
Creatinine, Ser: 1.03 mg/dL (ref 0.61–1.24)
GFR calc Af Amer: >60 mL/min (ref >60)
GFR calc non Af Amer: >60 mL/min (ref >60)
Glucose, Bld: 141 mg/dL — ABNORMAL HIGH (ref 65–99)
Potassium: 4.1 mmol/L (ref 3.5–5.1)
Sodium: 139 mmol/L (ref 135–145)
Total Bilirubin: 0.9 mg/dL (ref 0.3–1.2)
Total Protein: 7 g/dL (ref 6.5–8.1)

## 2017-08-05 LAB — RETICULOCYTES
RBC.: 3.21 MIL/uL — AB (ref 4.40–5.90)
RETIC COUNT ABSOLUTE: 102.7 10*3/uL (ref 19.0–183.0)
RETIC CT PCT: 3.2 % — AB (ref 0.4–3.1)

## 2017-08-05 LAB — TSH: TSH: 1.172 u[IU]/mL (ref 0.350–4.500)

## 2017-08-05 LAB — FERRITIN: Ferritin: 79 ng/mL (ref 24–336)

## 2017-08-05 LAB — MAGNESIUM: Magnesium: 1.8 mg/dL (ref 1.7–2.4)

## 2017-08-05 MED ORDER — HEPARIN SOD (PORK) LOCK FLUSH 100 UNIT/ML IV SOLN
500.0000 [IU] | Freq: Once | INTRAVENOUS | Status: AC
  Administered 2017-08-05: 500 [IU] via INTRAVENOUS

## 2017-08-05 MED ORDER — GLEEVEC 400 MG PO TABS: 400.0000 mg | ORAL_TABLET | Freq: Every day | ORAL | 0 refills | Status: AC

## 2017-08-05 MED ORDER — CYANOCOBALAMIN 1000 MCG/ML IJ SOLN
1000.0000 ug | Freq: Once | INTRAMUSCULAR | Status: AC
  Administered 2017-08-05: 1000 ug via INTRAMUSCULAR
  Filled 2017-08-05: qty 1

## 2017-08-05 MED ORDER — HEPARIN SOD (PORK) LOCK FLUSH 100 UNIT/ML IV SOLN
500.0000 [IU] | Freq: Once | INTRAVENOUS | Status: DC
  Filled 2017-08-05: qty 5

## 2017-08-05 MED ORDER — SODIUM CHLORIDE 0.9% FLUSH
10.0000 mL | INTRAVENOUS | Status: DC | PRN
  Administered 2017-08-05: 10 mL via INTRAVENOUS
  Filled 2017-08-05: qty 10

## 2017-08-05 MED ORDER — SODIUM CHLORIDE 0.9% FLUSH
10.0000 mL | INTRAVENOUS | Status: DC | PRN
  Filled 2017-08-05: qty 10

## 2017-08-05 NOTE — Progress Notes (Signed)
Patient states he is moving to [place].  States he needs prescription for Gleevac. Would like 90 day supply and dispense as written.  Otherwise, no complaints.

## 2017-08-08 LAB — INTRINSIC FACTOR ANTIBODIES: Intrinsic Factor: 1 AU/mL (ref 0.0–1.1)

## 2017-08-08 LAB — ANTI-PARIETAL ANTIBODY: Parietal Cell Antibody-IgG: 2.9 Units (ref 0.0–20.0)

## 2017-08-10 ENCOUNTER — Inpatient Hospital Stay: Payer: BLUE CROSS/BLUE SHIELD

## 2017-08-10 NOTE — Progress Notes (Unsigned)
Survivorship Care Plan visit completed.  Treatment summary reviewed and given to patient.  ASCO answers booklet reviewed and given to patient.  CARE program and Cancer Transitions discussed with patient along with other resources cancer center offers to patients and caregivers.  Patient verbalized understanding.    

## 2017-08-19 ENCOUNTER — Other Ambulatory Visit: Payer: BLUE CROSS/BLUE SHIELD

## 2017-09-05 ENCOUNTER — Inpatient Hospital Stay: Payer: BLUE CROSS/BLUE SHIELD | Attending: Hematology and Oncology

## 2017-10-04 ENCOUNTER — Telehealth: Payer: Self-pay | Admitting: Hematology and Oncology

## 2017-10-04 NOTE — Telephone Encounter (Signed)
Oral Oncology Patient Advocate Encounter  Received a refill for [place] from [facility]. Per Dr. Mike Gip patient has [place] out of [place]. We can not fill.  [facility] Patient Advocate [phone] 10/04/2017 4:29 PM

## 2017-10-07 ENCOUNTER — Ambulatory Visit: Payer: BLUE CROSS/BLUE SHIELD | Admitting: Radiation Oncology

## 2018-03-06 IMAGING — CT NM PET TUM IMG INITIAL (PI) SKULL BASE T - THIGH
9 series · 15 of 25 positions shown · non-contrast
Comparison: 02/14/2017 chest CT.  02/13/2017 neck CT.

CLINICAL DATA: Initial treatment strategy for p16 positive
metastatic squamous cell carcinoma diagnosed on surgical biopsy of a
left level 2 cervical lymph node. Primary nasopharyngeal carcinoma
suspected.

EXAM:
NUCLEAR MEDICINE PET SKULL BASE TO THIGH
TECHNIQUE: 11.8 mCi F-18 FDG was injected intravenously. Full-ring PET imaging
was performed from the skull base to thigh after the radiotracer. CT
data was obtained and used for attenuation correction and anatomic
localization.
FASTING BLOOD GLUCOSE:  Value: 108 mg/dl

[Series 3: ct wb 5.0 b30f · axial · 5.0mm · 0.98mm/px · z∈[-1730,-998]mm · 2 of 368 slices shown]
[im 1/368]
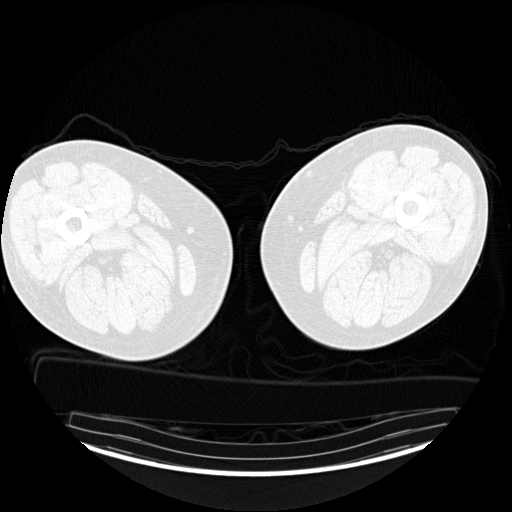
[im 245/368]
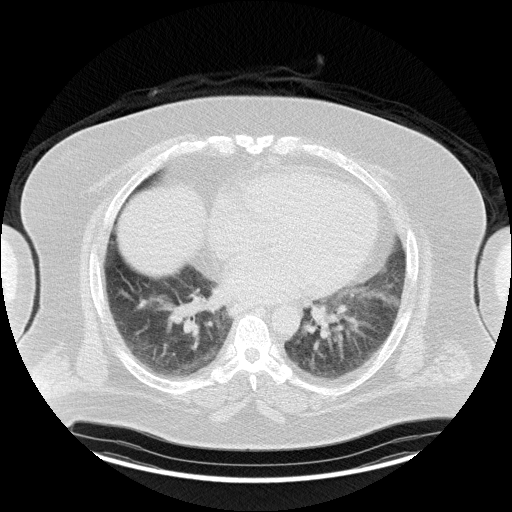

[Series 5: pet wb uncorrected (nac) · axial · 5.0mm · 4.07mm/px · z∈[-1730,-630]mm · 3 of 368 slices shown]
[im 1/368]
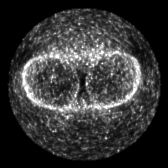
[im 123/368]
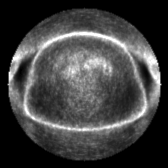
[im 368/368]
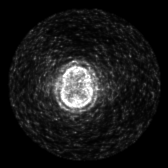

[Series 603: pet_ct axial fused · 2 of 365 slices shown]
[im 122/365]
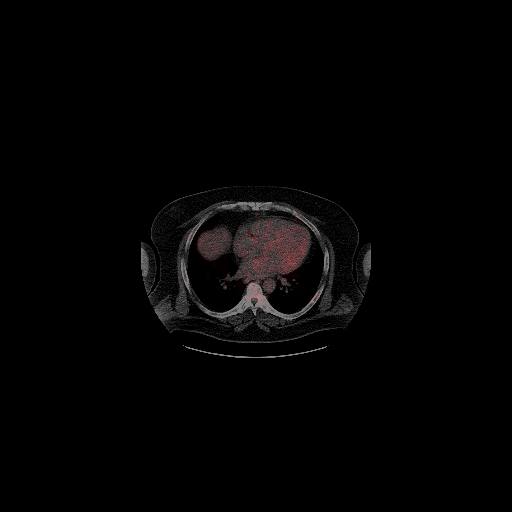
[im 243/365]
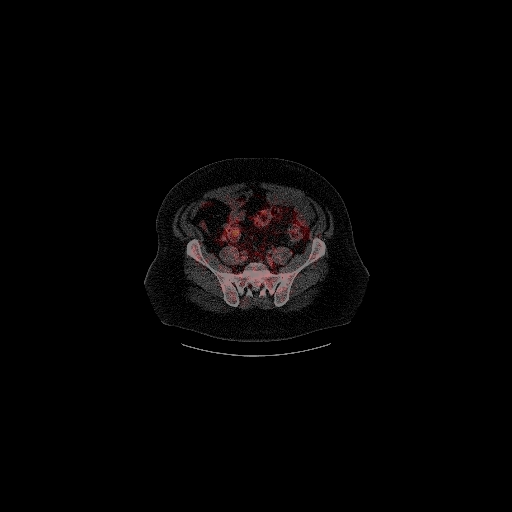

[Series 604: pet_ct coronal fused · 1 of 125 slices shown]
[im 1/125]
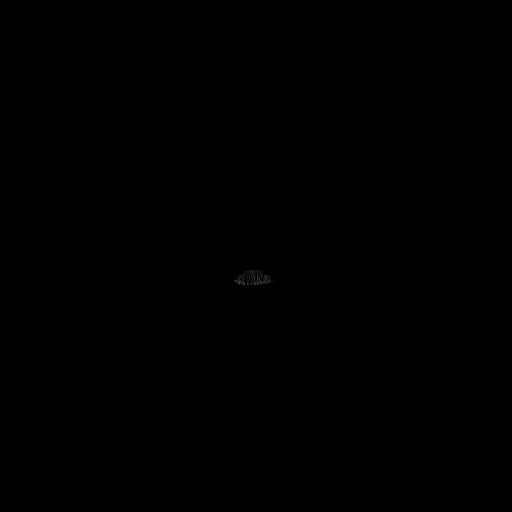

[Series 605: pet_ct sagittal fused · 2 of 166 slices shown]
[im 1/166]
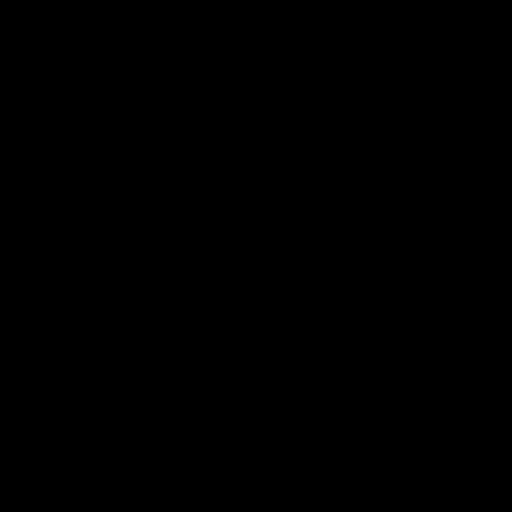
[im 166/166]
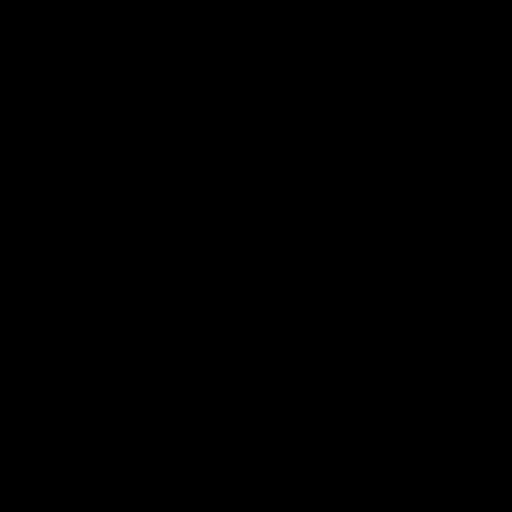

[Series 606: pet axial · 2 of 366 slices shown]
[im 122/366]
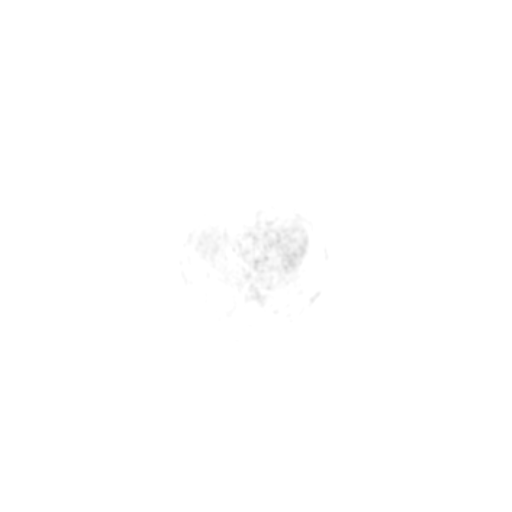
[im 366/366]
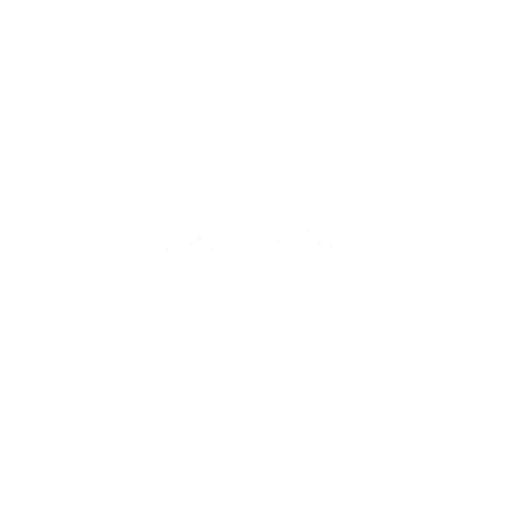

[Series 607: pet coronal · 1 of 133 slices shown]
[im 1/133]
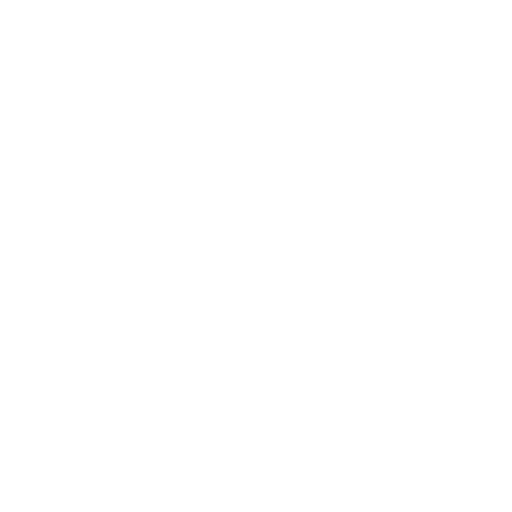

[Series 608: pet sagittal · 1 of 163 slices shown]
[im 1/163]
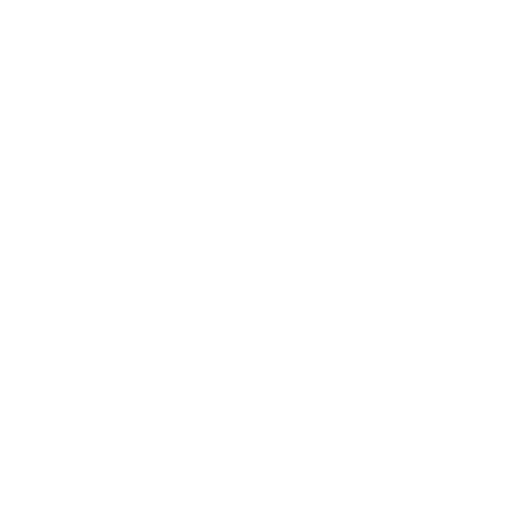

[Series 1031: results mm oncology reading · 0.51mm/px · 1 of 3 slices shown]
[im 1/3]
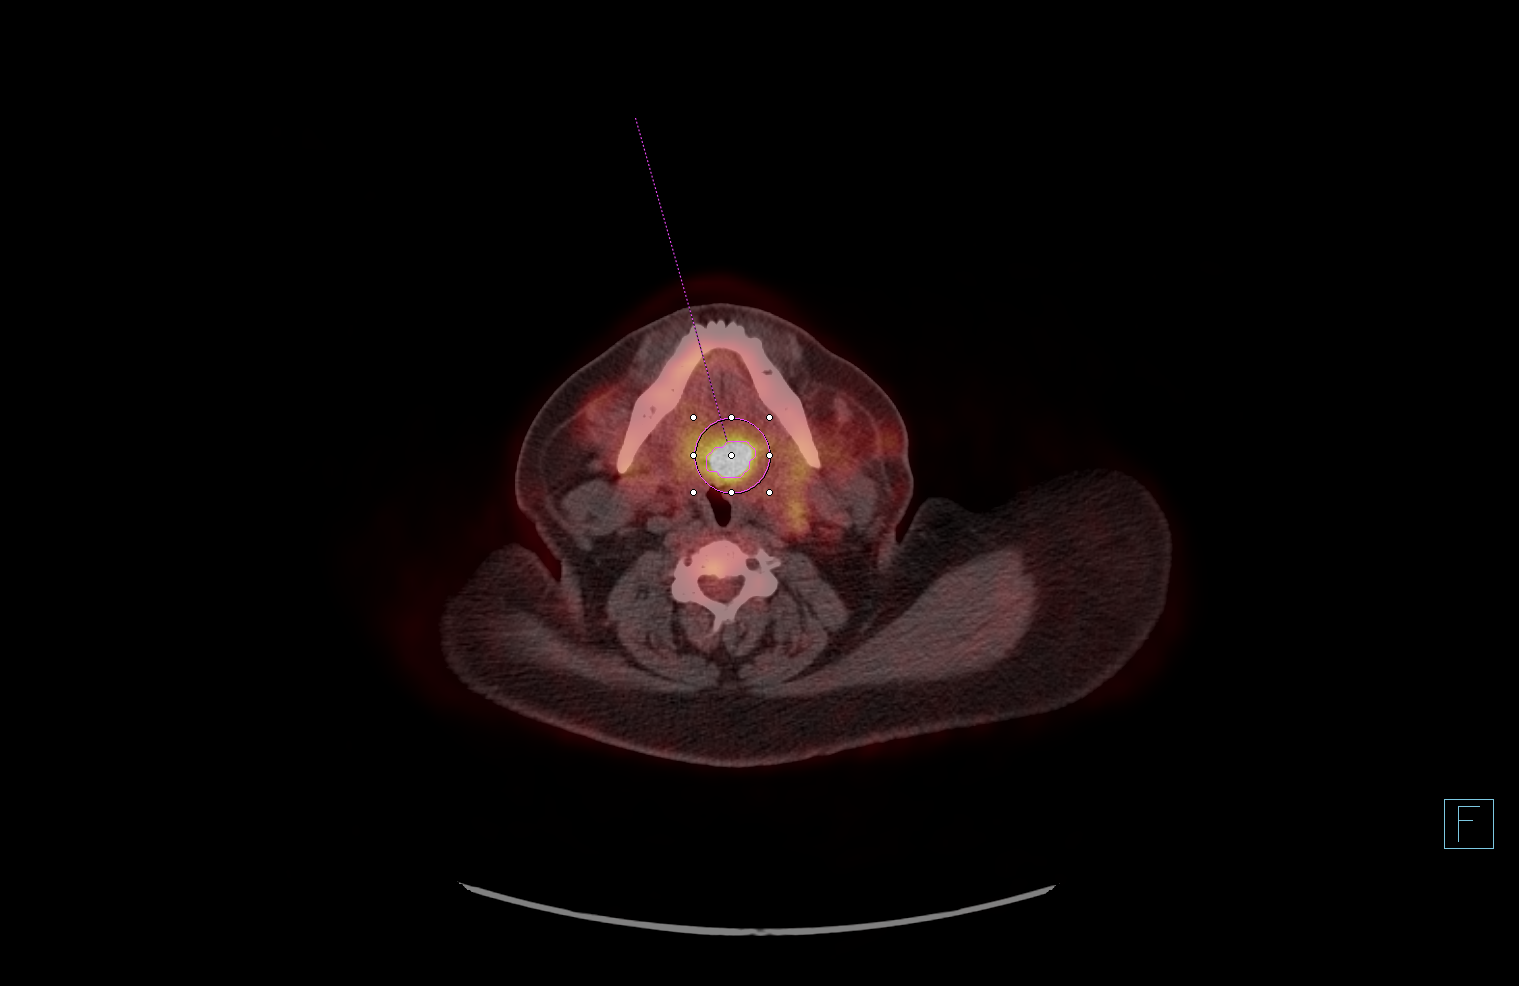

[15 of 25 positions shown; findings below may reference images not displayed]

FINDINGS: NECK

There is focal hypermetabolism in the left posterior oropharynx
centered at the left base of tongue with max SUV 14.2, with
associated asymmetric soft tissue fullness on the CT images (series
3/ image 48), compatible with primary oropharyngeal carcinoma.

There are two hypermetabolic left level 2 cervical lymph nodes,
largest measuring 2.5 cm short axis with max SUV 16.1 (series
3/image 39). No additional hypermetabolic lymph nodes in the left
neck. No hypermetabolic lymph nodes in the right neck. Mucous
retention cyst versus polyp in the inferior left maxillary sinus.

CHEST

Right internal jugular MediPort terminates at the cavoatrial
junction. Spinal stimulator device terminates in the posterior
thoracic canal at the T8 level. Mildly atherosclerotic nonaneurysmal
thoracic aorta. No hypermetabolic axillary, mediastinal or hilar
lymph nodes. No pneumothorax. No pleural effusions. No acute
consolidative airspace disease, lung masses or significant pulmonary
nodules.

ABDOMEN/PELVIS

The PET images of the abdomen and pelvis demonstrates significant
signal noise, probably due to the patient's large body habitus,
somewhat limiting interpretation.

No abnormal hypermetabolic activity within the liver, pancreas,
adrenal glands, or spleen. No hypermetabolic lymph nodes in the
abdomen or pelvis. Small hiatal hernia. Diffuse hepatic steatosis.
Hepatomegaly. Atherosclerotic nonaneurysmal abdominal aorta. Mild
scattered colonic diverticulosis.

SKELETON

No focal hypermetabolic activity to suggest skeletal metastasis.
IMPRESSION: 1. Focal hypermetabolism in the left posterior oropharynx centered
at the left base of tongue with associated asymmetric soft tissue
fullness on the CT images, compatible with primary oropharyngeal
carcinoma.
2. Hypermetabolic left level II cervical nodal metastases.
3. No hypermetabolic contralateral cervical nodal metastases or
distant metastatic disease.
4. Additional findings include aortic atherosclerosis, small hiatal
hernia, diffuse hepatic steatosis and mild colonic diverticulosis.

## 2019-12-21 ENCOUNTER — Other Ambulatory Visit: Payer: Self-pay | Admitting: Gastroenterology

## 2019-12-21 DIAGNOSIS — R945 Abnormal results of liver function studies: Secondary | ICD-10-CM

## 2019-12-21 DIAGNOSIS — R7989 Other specified abnormal findings of blood chemistry: Secondary | ICD-10-CM

## 2020-01-10 ENCOUNTER — Other Ambulatory Visit: Payer: Self-pay | Admitting: Gastroenterology

## 2022-04-22 DEATH — deceased
